# Patient Record
Sex: Male | Born: 1985
Health system: Southern US, Community
[De-identification: ages and names within clinical notes are randomized; demographics above are authoritative.]

## PROBLEM LIST (undated history)

## (undated) DIAGNOSIS — Z1389 Encounter for screening for other disorder: Secondary | ICD-10-CM

## (undated) DIAGNOSIS — T7840XA Allergy, unspecified, initial encounter: Secondary | ICD-10-CM

## (undated) DIAGNOSIS — Z1339 Encounter for screening examination for other mental health and behavioral disorders: Secondary | ICD-10-CM

## (undated) DIAGNOSIS — F319 Bipolar disorder, unspecified: Secondary | ICD-10-CM

## (undated) HISTORY — DX: Encounter for screening for other disorder: Z13.89

## (undated) HISTORY — DX: Bipolar disorder, unspecified: F31.9

## (undated) HISTORY — DX: Allergy, unspecified, initial encounter: T78.40XA

## (undated) HISTORY — DX: Encounter for screening examination for other mental health and behavioral disorders: Z13.39

---
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
## 2014-05-05 ENCOUNTER — Encounter: Payer: Self-pay | Admitting: Family

## 2014-05-05 ENCOUNTER — Ambulatory Visit (INDEPENDENT_AMBULATORY_CARE_PROVIDER_SITE_OTHER): Payer: BC Managed Care – PPO | Admitting: Family

## 2014-05-05 VITALS — BP 152/92 | HR 83 | Temp 98.4°F | Resp 18 | Ht 67.0 in | Wt 195.8 lb

## 2014-05-05 DIAGNOSIS — F902 Attention-deficit hyperactivity disorder, combined type: Secondary | ICD-10-CM | POA: Diagnosis not present

## 2014-05-05 DIAGNOSIS — Z23 Encounter for immunization: Secondary | ICD-10-CM | POA: Diagnosis not present

## 2014-05-05 DIAGNOSIS — F3162 Bipolar disorder, current episode mixed, moderate: Secondary | ICD-10-CM | POA: Diagnosis not present

## 2014-05-05 DIAGNOSIS — F909 Attention-deficit hyperactivity disorder, unspecified type: Secondary | ICD-10-CM | POA: Insufficient documentation

## 2014-05-05 MED ORDER — QUETIAPINE FUMARATE ER 50 MG PO TB24: 50.0000 mg | ORAL_TABLET | Freq: Every day | ORAL | Status: DC

## 2014-05-05 MED ORDER — METHYLPHENIDATE HCL ER (LA) 40 MG PO CP24: 40.0000 mg | ORAL_CAPSULE | ORAL | Status: DC

## 2014-05-05 NOTE — Progress Notes (Signed)
   Subjective:    Patient ID: Jacob [name], male    DOB: [birth year], 28 y.o.   MRN: [id number]  Chief Complaint  Patient presents with  . Establish Care    Bipolar disorder, needs medication    HPI:  Jacob CharityGraham L Minkin is a 28 y.o. male who presents today for to establish care and discuss his ADHD and Bipolar disorder. His wife Marcelino DusterMichelle is present for the visit with his permission.  1) ADHD - Diagnosed as a child. Was previously on Vyvanse, but was changed to Ritalin because of change of insurance and price of medication. Indicates he is able to focus some, but not the best that he could with the Vyvanse. He reports eating well which is confirmed by his wife. He reports sleeping on average 6-7 hours per night but is awakening feeling fatigued like he didn't sleep. He was previously prescribed trazadone and seroquel to help him sleep at night, but was discontinued by previous provider.   2) Bipolar - Diagnosed as a teenager - was retested in 2013 and confirmed. On lithium as a teenager - stopped taking when he moved out and had to start taking it again. He was previously treated in [place], but does not recall the medications he was taking at that time. Attempted to have records transferred before visit, however nothing has been received to this point. Questioning if he needs to go up on medication or change medications. Has never really had depression, but instead anger.  No Known Allergies  Current outpatient prescriptions:cetirizine (ZYRTEC) 10 MG tablet, Take 10 mg by mouth daily., Disp: , Rfl: ;  escitalopram (LEXAPRO) 10 MG tablet, Take 10 mg by mouth daily., Disp: , Rfl: ;  lamoTRIgine (LAMICTAL) 100 MG tablet, Take 100 mg by mouth 2 (two) times daily., Disp: , Rfl: ;  methylphenidate (RITALIN LA) 40 MG 24 hr capsule, Take 1 capsule (40 mg total) by mouth every morning., Disp: 30 capsule, Rfl: 0 QUEtiapine (SEROQUEL XR) 50 MG TB24 24 hr tablet, Take 1 tablet (50 mg total) by mouth at  bedtime., Disp: 30 each, Rfl: 0  Review of Systems    See HPI Objective:    BP 152/92  Pulse 83  Temp(Src) 98.4 F (36.9 C)  Resp 18  Ht 5\' 7"  (1.702 m)  Wt 195 lb 12.8 oz (88.814 kg)  BMI 30.66 kg/m2  SpO2 95% Nursing note and vital signs reviewed.  Physical Exam  Constitutional: He is oriented to person, place, and time. He appears well-developed and well-nourished. No distress.  Cardiovascular: Normal rate, regular rhythm and normal heart sounds.   Pulmonary/Chest: Effort normal and breath sounds normal.  Neurological: He is alert and oriented to person, place, and time.  Skin: Skin is warm and dry.  Psychiatric: He has a normal mood and affect. His behavior is normal. Judgment and thought content normal.       Assessment & Plan:

## 2014-05-05 NOTE — Patient Instructions (Addendum)
Thank you for choosing [facility].  Summary/Instructions:   Please follow up in about 3 weeks  Your prescription has been sent to your pharmacy  Please obtain your old records from [place]

## 2014-05-05 NOTE — Assessment & Plan Note (Signed)
Currently maintained on Lexapro and Lamictal. Appears stable based on patient behavior in office. Wife believes he needs something added or adjusted. Continue current Lexapro and lamictal until records have been received from previous provider. Start Seroquel 50 mg for help with sleep. Follow up in 1 month.

## 2014-05-05 NOTE — Progress Notes (Signed)
Pre visit review using our clinic review tool, if applicable. No additional management support is needed unless otherwise documented below in the visit note. 

## 2014-05-05 NOTE — Assessment & Plan Note (Signed)
Currently maintained on Ritalin with no adverse effects that are apparent. Prefers Vyvanse but cost is a factor. Continue Ritalin and will follow up in 1 month or sooner if needed.

## 2014-05-31 ENCOUNTER — Ambulatory Visit (INDEPENDENT_AMBULATORY_CARE_PROVIDER_SITE_OTHER): Payer: BC Managed Care – PPO | Admitting: Family

## 2014-05-31 ENCOUNTER — Encounter: Payer: Self-pay | Admitting: Family

## 2014-05-31 VITALS — BP 136/78 | HR 87 | Temp 98.3°F | Resp 18 | Ht 67.0 in | Wt 205.0 lb

## 2014-05-31 DIAGNOSIS — F902 Attention-deficit hyperactivity disorder, combined type: Secondary | ICD-10-CM

## 2014-05-31 DIAGNOSIS — F3162 Bipolar disorder, current episode mixed, moderate: Secondary | ICD-10-CM

## 2014-05-31 MED ORDER — QUETIAPINE FUMARATE ER 50 MG PO TB24: 100.0000 mg | ORAL_TABLET | Freq: Every day | ORAL | Status: DC

## 2014-05-31 MED ORDER — METHYLPHENIDATE HCL ER (LA) 40 MG PO CP24: 40.0000 mg | ORAL_CAPSULE | ORAL | Status: DC

## 2014-05-31 NOTE — Patient Instructions (Signed)
Thank you for choosing [facility].  Summary/Instructions:  Your prescription(s) have been submitted to your pharmacy. Please take as directed and contact our office if you believe you are having problem(s) with the medication(s).  Increase your nightly Seroquel to 100 mg nightly. There is plenty of room to work with this.   We will continue to work on obtaining your old records from previous care providers to gather the information that we need.   Please follow up in about 1 month.

## 2014-05-31 NOTE — Assessment & Plan Note (Signed)
Attention appears improved with no adverse effects. Continue current Ritalin LA 40 mg. Follow up in 1 month with Bipolar check.

## 2014-05-31 NOTE — Progress Notes (Signed)
   Subjective:    Patient ID: Jacob [name], male    DOB: [birth year], 28 y.o.   MRN: [id number]  No chief complaint on file.   HPI:  Jacob CharityGraham L Ault is a 28 y.o. male who presents today to follow up on his ADHD and Bipolar disorder. He was previously restarted on Seroquel at the patient's request.   Bipolar/ADHD - Currently maintained on lexapro, lamictal, methylphenidate and quetiapine. Has had some improvements with his sleep, wife describes that he still is restless some nights. He continues to use the CPAP at night to help him sleep. Continues to have daily episodes of becoming distempered, but not violent.   Indicates Some small improvements with the Seroquel. Still sees himself getting ill tempered. Wife describes the frequency has been just about every day. States that his attention has improved. Denies any chest pain or palpitations. Is eating well and getting on average 5-6 hours of sleep most days of the week.   [place] [facility] [place] [facility] [place] [place] [facility].   No Known Allergies   Past Medical History  Diagnosis Date  . Bipolar disorder   . Allergy   . ADHD (attention deficit hyperactivity disorder) evaluation     Review of Systems     Objective:    BP 136/78 mmHg  Pulse 87  Temp(Src) 98.3 F (36.8 C) (Oral)  Resp 18  Ht 5\' 7"  (1.702 m)  Wt 205 lb (92.987 kg)  BMI 32.10 kg/m2  SpO2 95%   Nursing note and vital signs reviewed.  Physical Exam  Constitutional: He is oriented to person, place, and time. He appears well-developed and well-nourished. No distress.  Cardiovascular: Normal rate, regular rhythm, normal heart sounds and intact distal pulses.   Pulmonary/Chest: Effort normal and breath sounds normal.  Neurological: He is alert and oriented to person, place, and time.  Skin: Skin is warm and dry.  Psychiatric: He has a normal mood and affect. His behavior is normal. Judgment and thought content normal.      Assessment & Plan:

## 2014-05-31 NOTE — Assessment & Plan Note (Signed)
Increase Seroquel to 100 mg. Pt and wife are wishing to wait on referral to psychiatry secondary to cost and having records. Continue lamictal and lexapro at current dosages. Follow up in one month.

## 2014-05-31 NOTE — Progress Notes (Signed)
Pre visit review using our clinic review tool, if applicable. No additional management support is needed unless otherwise documented below in the visit note. 

## 2014-06-22 ENCOUNTER — Telehealth: Payer: Self-pay | Admitting: Family

## 2014-06-22 NOTE — Telephone Encounter (Signed)
[facility]. Sent to Dr. Carver Filaalone. 06/22/14/ss.

## 2014-07-07 ENCOUNTER — Encounter: Payer: Self-pay | Admitting: Family

## 2014-07-07 ENCOUNTER — Ambulatory Visit (INDEPENDENT_AMBULATORY_CARE_PROVIDER_SITE_OTHER): Payer: BC Managed Care – PPO | Admitting: Family

## 2014-07-07 ENCOUNTER — Other Ambulatory Visit: Payer: BC Managed Care – PPO

## 2014-07-07 ENCOUNTER — Other Ambulatory Visit (INDEPENDENT_AMBULATORY_CARE_PROVIDER_SITE_OTHER): Payer: BC Managed Care – PPO

## 2014-07-07 VITALS — BP 122/72 | HR 111 | Temp 98.3°F | Resp 18 | Ht 67.0 in | Wt 214.4 lb

## 2014-07-07 DIAGNOSIS — F3162 Bipolar disorder, current episode mixed, moderate: Secondary | ICD-10-CM

## 2014-07-07 DIAGNOSIS — F902 Attention-deficit hyperactivity disorder, combined type: Secondary | ICD-10-CM

## 2014-07-07 DIAGNOSIS — M25511 Pain in right shoulder: Secondary | ICD-10-CM | POA: Insufficient documentation

## 2014-07-07 LAB — BASIC METABOLIC PANEL
BUN: 17 mg/dL (ref 6–23)
CO2: 27 meq/L (ref 19–32)
Calcium: 9.4 mg/dL (ref 8.4–10.5)
Chloride: 106 mEq/L (ref 96–112)
Creatinine, Ser: 1 mg/dL (ref 0.4–1.5)
GFR: 90.74 mL/min (ref >60.00)
GLUCOSE: 92 mg/dL (ref 70–99)
Potassium: 4.2 mEq/L (ref 3.5–5.1)
SODIUM: 137 meq/L (ref 135–145)

## 2014-07-07 LAB — CBC
HEMATOCRIT: 45.2 % (ref 39.0–52.0)
Hemoglobin: 15.3 g/dL (ref 13.0–17.0)
MCHC: 33.8 g/dL (ref 30.0–36.0)
MCV: 92.6 fl (ref 78.0–100.0)
Platelets: 260 10*3/uL (ref 150.0–400.0)
RBC: 4.89 Mil/uL (ref 4.22–5.81)
RDW: 12.9 % (ref 11.5–15.5)
WBC: 7.9 10*3/uL (ref 4.0–10.5)

## 2014-07-07 LAB — TSH: TSH: 1.16 u[IU]/mL (ref 0.35–4.50)

## 2014-07-07 MED ORDER — IBUPROFEN 800 MG PO TABS: 800.0000 mg | ORAL_TABLET | Freq: Three times a day (TID) | ORAL | Status: DC | PRN

## 2014-07-07 MED ORDER — LAMOTRIGINE 100 MG PO TABS: 100.0000 mg | ORAL_TABLET | Freq: Two times a day (BID) | ORAL | Status: DC

## 2014-07-07 MED ORDER — QUETIAPINE FUMARATE ER 50 MG PO TB24: ORAL_TABLET | ORAL | Status: DC

## 2014-07-07 MED ORDER — QUETIAPINE FUMARATE ER 300 MG PO TB24: 300.0000 mg | ORAL_TABLET | Freq: Every day | ORAL | Status: DC

## 2014-07-07 MED ORDER — METHYLPHENIDATE HCL ER (LA) 40 MG PO CP24: 40.0000 mg | ORAL_CAPSULE | ORAL | Status: DC

## 2014-07-07 NOTE — Patient Instructions (Addendum)
Thank you for choosing [facility].  Summary/Instructions:  Your prescription(s) have been submitted to your pharmacy. Please take as directed and contact our office if you believe you are having problem(s) with the medication(s).  If your symptoms worsen or fail to improve, please contact our office for further instruction, or in case of emergency go directly to the emergency room at the closest medical facility.      

## 2014-07-07 NOTE — Assessment & Plan Note (Signed)
Continues to experience mood swings. Discontinue Lexapro. Increase Seroquel. Continue Lamictal at 100 mg. Patient advised of side effects and risks and benefits of medication changes. Patient given instructions on proper increase of medication and decrease in discontinuation of other medication. Follow-up in one month. Obtain basic metabolic panel, CBC, and TSH.

## 2014-07-07 NOTE — Assessment & Plan Note (Signed)
Currently stable at this time. Continue current regimen of Ritalin long-acting 40 mg daily.

## 2014-07-07 NOTE — Progress Notes (Signed)
   Subjective:    Patient ID: Jacob [name], male    DOB: 1985-08-26, 28 y.o.   MRN: [id number]  Chief Complaint  Patient presents with  . Follow-up    discuss paperwork    HPI:  Jacob CharityGraham L Monterrosa is a 28 y.o. male who presents today for follow up. His wife is present with his permission.  1) Bipolar - Previously increased his Seroquel XR to 100 mg daily. Has noticed some positive changes in his mood, however indicates it is still not where it was previously. His wife indicates that his mood swings continue to be an issue. She also notes the patient has had increased hunger and has gained approximately 20 pounds in the last several months. Patient denies any adverse effects of medications at this time. Reviewed previous medical records with patient to assist in determining appropriate treatment that he was on previously.   2) Shoulder pain - Notes he has had a click in his right shoulder with a pain that is described as between sharp and dull. Worsened by raising his arm over his head. This has been going on for about a month. He does a lot of overhead and repetitive motions at work. Denies any trauma or treatments that have improved his pain.  No Known Allergies  Current Outpatient Prescriptions on File Prior to Visit  Medication Sig Dispense Refill  . cetirizine (ZYRTEC) 10 MG tablet Take 10 mg by mouth daily.     No current facility-administered medications on file prior to visit.   Past Medical History  Diagnosis Date  . Bipolar disorder   . Allergy   . ADHD (attention deficit hyperactivity disorder) evaluation      Review of Systems  Cardiovascular: Negative for chest pain and palpitations.  Musculoskeletal: Positive for arthralgias (Right shoulder).  Neurological: Negative for tremors and headaches.  Psychiatric/Behavioral: Negative for decreased concentration.       Positive for mood swings      Objective:    BP 122/72 mmHg  Pulse 111  Temp(Src) 98.3 F (36.8 C)  (Oral)  Resp 18  Ht 5\' 7"  (1.702 m)  Wt 214 lb 6.4 oz (97.251 kg)  BMI 33.57 kg/m2  SpO2 98% Nursing note and vital signs reviewed.  Physical Exam  Constitutional: He is oriented to person, place, and time. He appears well-developed and well-nourished. No distress.  Cardiovascular: Normal rate, regular rhythm, normal heart sounds and intact distal pulses.   Pulmonary/Chest: Effort normal and breath sounds normal.  Musculoskeletal:  No obvious deformity, discoloration, or edema of right shoulder noted. Point tenderness elicited along subacromial space and area supraspinatus tendon. Patient exhibits slightly decreased range of motion greater than 90 abduction and flexion. Positive Neer's impingement. Negative apprehension, empty can, and drop arm.  Neurological: He is alert and oriented to person, place, and time.  Skin: Skin is warm and dry.  Psychiatric: He has a normal mood and affect. His behavior is normal. Judgment and thought content normal.       Assessment & Plan:

## 2014-07-07 NOTE — Progress Notes (Signed)
Pre visit review using our clinic review tool, if applicable. No additional management support is needed unless otherwise documented below in the visit note. 

## 2014-07-07 NOTE — Assessment & Plan Note (Signed)
Right shoulder pain consistent with potential impingement. Recommend conservative treatment at this time. Start ibuprofen 800 mg. If no improvement noted in the next 10 days, will refer to sports medicine for further evaluation. Continue to ice as needed.

## 2014-07-12 ENCOUNTER — Ambulatory Visit: Payer: BC Managed Care – PPO | Admitting: Family

## 2014-07-15 ENCOUNTER — Encounter: Payer: Self-pay | Admitting: Family

## 2014-07-20 NOTE — Telephone Encounter (Signed)
Please see email, pt called in today about this

## 2014-07-28 ENCOUNTER — Ambulatory Visit (INDEPENDENT_AMBULATORY_CARE_PROVIDER_SITE_OTHER): Payer: BLUE CROSS/BLUE SHIELD | Admitting: Family

## 2014-07-28 ENCOUNTER — Encounter: Payer: Self-pay | Admitting: Family

## 2014-07-28 VITALS — BP 142/90 | HR 78 | Temp 98.3°F | Resp 18 | Wt 222.2 lb

## 2014-07-28 DIAGNOSIS — F3162 Bipolar disorder, current episode mixed, moderate: Secondary | ICD-10-CM

## 2014-07-28 MED ORDER — QUETIAPINE FUMARATE 300 MG PO TABS: 300.0000 mg | ORAL_TABLET | Freq: Every day | ORAL | Status: DC

## 2014-07-28 MED ORDER — QUETIAPINE FUMARATE 100 MG PO TABS: ORAL_TABLET | ORAL | Status: DC

## 2014-07-28 NOTE — Patient Instructions (Addendum)
Thank you for choosing [facility].  Summary/Instructions:  Your prescription(s) have been submitted to your pharmacy or been printed and provided for you. Please take as directed and contact our office if you believe you are having problem(s) with the medication(s) or have any questions.  If your symptoms worsen or fail to improve, please contact our office for further instruction, or in case of emergency go directly to the emergency room at the closest medical facility.   Restart seroquel:  50 mg the first day (0.5 tablets) 100 mg the second day (1 tablet) 200 mg the third day (2 tablets) 300 mg from then on

## 2014-07-28 NOTE — Assessment & Plan Note (Signed)
Patient's symptoms worsen when off Seroquel. Does not appear to have any adverse effects from the Lexapro discontinuation. Restart Seroquel with goal of 300 mg daily. If continued to have problems with insurance filling medication, patient requested to bring a list of medications approved by insurance from his pharmacy. Patient advised to seek emergency care if symptoms of increased thoughts of suicide develop starting Seroquel. Follow up in approximately one month.

## 2014-07-28 NOTE — Progress Notes (Signed)
Pre visit review using our clinic review tool, if applicable. No additional management support is needed unless otherwise documented below in the visit note. 

## 2014-07-28 NOTE — Progress Notes (Signed)
   Subjective:    Patient ID: Jacob [name], male    DOB: [birth year], 29 y.o.   MRN: [id number]  No chief complaint on file.   HPI:  Jacob CharityGraham L Knight is a 29 y.o. male who presents today to follow up on his bipolar.   Was recently increased in his Seroquel and discontinued his Lexapro. Serequeol was not approved by his insurance so he has not taken any seroquel since previous office visit.  He remains on lamictal. Indicates that he has had continued agitation. His wife indicates this has been exponentially increased since being off his seroquel. Reports his attention has been appropriate and continues to have a good appetite. Wife denies any full manic or depressive episodes.   No Known Allergies  Current Outpatient Prescriptions on File Prior to Visit  Medication Sig Dispense Refill  . cetirizine (ZYRTEC) 10 MG tablet Take 10 mg by mouth daily.    Marland Kitchen. ibuprofen (ADVIL,MOTRIN) 800 MG tablet Take 1 tablet (800 mg total) by mouth every 8 (eight) hours as needed. 30 tablet 0  . lamoTRIgine (LAMICTAL) 100 MG tablet Take 1 tablet (100 mg total) by mouth 2 (two) times daily. 60 tablet 1  . methylphenidate (RITALIN LA) 40 MG 24 hr capsule Take 1 capsule (40 mg total) by mouth every morning. 30 capsule 0   No current facility-administered medications on file prior to visit.    Review of Systems  Respiratory: Negative for chest tightness and shortness of breath.   Cardiovascular: Negative for chest pain, palpitations and leg swelling.  Psychiatric/Behavioral: Positive for sleep disturbance and agitation. Negative for hallucinations and decreased concentration.      Objective:  BP 142/90 mmHg  Pulse 78  Temp(Src) 98.3 F (36.8 C) (Oral)  Resp 18  Wt 222 lb 3.2 oz (100.789 kg)  SpO2 98%  Nursing note and vital signs reviewed.  Physical Exam  Constitutional: He is oriented to person, place, and time. He appears well-developed and well-nourished. No distress.  Cardiovascular: Normal rate,  regular rhythm, normal heart sounds and intact distal pulses.   Pulmonary/Chest: Effort normal and breath sounds normal.  Neurological: He is alert and oriented to person, place, and time.  Skin: Skin is warm and dry.  Psychiatric: He has a normal mood and affect. His behavior is normal. Judgment and thought content normal.       Assessment & Plan:

## 2014-08-08 ENCOUNTER — Ambulatory Visit: Payer: BC Managed Care – PPO | Admitting: Family

## 2014-08-29 ENCOUNTER — Ambulatory Visit (INDEPENDENT_AMBULATORY_CARE_PROVIDER_SITE_OTHER): Payer: BLUE CROSS/BLUE SHIELD | Admitting: Family

## 2014-08-29 ENCOUNTER — Encounter: Payer: Self-pay | Admitting: Family

## 2014-08-29 VITALS — BP 138/90 | HR 72 | Temp 98.5°F | Resp 18 | Wt 223.8 lb

## 2014-08-29 DIAGNOSIS — F3162 Bipolar disorder, current episode mixed, moderate: Secondary | ICD-10-CM

## 2014-08-29 DIAGNOSIS — F902 Attention-deficit hyperactivity disorder, combined type: Secondary | ICD-10-CM

## 2014-08-29 MED ORDER — ARIPIPRAZOLE 10 MG PO TABS: 10.0000 mg | ORAL_TABLET | Freq: Every day | ORAL | Status: DC

## 2014-08-29 MED ORDER — METHYLPHENIDATE HCL ER (LA) 40 MG PO CP24: 40.0000 mg | ORAL_CAPSULE | ORAL | Status: DC

## 2014-08-29 MED ORDER — QUETIAPINE FUMARATE 300 MG PO TABS: 300.0000 mg | ORAL_TABLET | Freq: Every day | ORAL | Status: DC

## 2014-08-29 NOTE — Patient Instructions (Signed)
Thank you for choosing [facility].  Summary/Instructions:  Your prescription(s) have been submitted to your pharmacy or been printed and provided for you. Please take as directed and contact our office if you believe you are having problem(s) with the medication(s) or have any questions.  If your symptoms worsen or fail to improve, please contact our office for further instruction, or in case of emergency go directly to the emergency room at the closest medical facility.    Aripiprazole tablets What is this medicine? ARIPIPRAZOLE (ay ri PIP ray zole) is an atypical antipsychotic. It is used to treat schizophrenia and bipolar disorder, also known as manic-depression. It is also used to treat Tourette's disorder and some symptoms of autism. This medicine may also be used in combination with antidepressants to treat major depressive disorder. This medicine may be used for other purposes; ask your health care provider or pharmacist if you have questions. COMMON BRAND NAME(S): Abilify What should I tell my health care provider before I take this medicine? They need to know if you have any of these conditions: -dehydration -dementia -diabetes -heart disease -history of stroke -low blood counts, like low white cell, platelet, or red cell counts -Parkinson's disease -seizures -suicidal thoughts, plans, or attempt; a previous suicide attempt by you or a family member -an unusual or allergic reaction to aripiprazole, other medicines, foods, dyes, or preservatives -pregnant or trying to get pregnant -breast-feeding How should I use this medicine? Take this medicine by mouth with a glass of water. Follow the directions on the prescription label. You can take this medicine with or without food. Take your doses at regular intervals. Do not take your medicine more often than directed. Do not stop taking except on the advice of your doctor or health care professional. A special MedGuide will be  given to you by the pharmacist with each prescription and refill. Be sure to read this information carefully each time. Talk to your pediatrician regarding the use of this medicine in children. While this drug may be prescribed for children as young as 6 years of age for selected conditions, precautions do apply. Overdosage: If you think you have taken too much of this medicine contact a poison control center or emergency room at once. NOTE: This medicine is only for you. Do not share this medicine with others. What if I miss a dose? If you miss a dose, take it as soon as you can. If it is almost time for your next dose, take only that dose. Do not take double or extra doses. What may interact with this medicine? Do not take this medicine with any of the following medications: -certain medicines for fungal infections like fluconazole, itraconazole, ketoconazole, posaconazole, voriconazole -cisapride -dofetilide -dronedarone -pimozide -thioridazine -ziprasidone This medicine may also interact with the following medications: -carbamazepine -certain medicines for blood pressure -erythromycin -fluoxetine -grapefruit juice -other medicines that prolong the QT interval (cause an abnormal heart rhythm) -paroxetine -quinidine -valproic acid This list may not describe all possible interactions. Give your health care provider a list of all the medicines, herbs, non-prescription drugs, or dietary supplements you use. Also tell them if you smoke, drink alcohol, or use illegal drugs. Some items may interact with your medicine. What should I watch for while using this medicine? Visit your doctor or health care professional for regular checks on your progress. It may be several weeks before you see the full effects of this medicine. Do not suddenly stop taking this medicine. You may need to   gradually reduce the dose. Patients and their families should watch out for worsening depression or thoughts of  suicide. Also watch out for sudden changes in feelings such as feeling anxious, agitated, panicky, irritable, hostile, aggressive, impulsive, severely restless, overly excited and hyperactive, or not being able to sleep. If this happens, especially at the beginning of antidepressant treatment or after a change in dose, call your health care professional. You may get dizzy or drowsy. Do not drive, use machinery, or do anything that needs mental alertness until you know how this medicine affects you. Do not stand or sit up quickly, especially if you are an older patient. This reduces the risk of dizzy or fainting spells. Alcohol can increase dizziness and drowsiness. Avoid alcoholic drinks. This medicine can reduce the response of your body to heat or cold. Dress warm in cold weather and stay hydrated in hot weather. If possible, avoid extreme temperatures like saunas, hot tubs, very hot or cold showers, or activities that can cause dehydration such as vigorous exercise. This medicine may cause dry eyes and blurred vision. If you wear contact lenses you may feel some discomfort. Lubricating drops may help. See your eye doctor if the problem does not go away or is severe. If you notice an increased hunger or thirst, different from your normal hunger or thirst, or if you find that you have to urinate more frequently, you should contact your health care provider as soon as possible. You may need to have your blood sugar monitored. This medicine may cause changes in your blood sugar levels. You should monitor you blood sugar frequently if you have diabetes. What side effects may I notice from receiving this medicine? Side effects that you should report to your doctor or health care professional as soon as possible: -allergic reactions like skin rash, itching or hives, swelling of the face, lips, or tongue -breathing problems -confusion -feeling faint or lightheaded, falls -fever or chills, sore  throat -increased hunger or thirst -increased urination -joint pain -muscles pain, spasms -problems with balance, talking, walking -restlessness or need to keep moving -seizures -suicidal thoughts or other mood changes -trouble swallowing -uncontrollable head, mouth, neck, arm, or leg movements -unusually weak or tired Side effects that usually do not require medical attention (report to your doctor or health care professional if they continue or are bothersome): -blurred vision -constipation -headache -nausea, vomiting -trouble sleeping -weight gain This list may not describe all possible side effects. Call your doctor for medical advice about side effects. You may report side effects to FDA at 1-800-FDA-1088. Where should I keep my medicine? Keep out of the reach of children. Store at room temperature between 15 and 30 degrees C (59 and 86 degrees F). Throw away any unused medicine after the expiration date. NOTE: This sheet is a summary. It may not cover all possible information. If you have questions about this medicine, talk to your doctor, pharmacist, or health care provider.  2015, Elsevier/Gold Standard. (2013-07-21 12:40:07)  

## 2014-08-29 NOTE — Progress Notes (Signed)
Pre visit review using our clinic review tool, if applicable. No additional management support is needed unless otherwise documented below in the visit note. 

## 2014-08-29 NOTE — Assessment & Plan Note (Signed)
Stable with current regimen. Continue current dosage of Ritalin LA.

## 2014-08-29 NOTE — Assessment & Plan Note (Signed)
Mood remains labile with current regimen. Continue current dosage of Lamictal and Seroquel. Start low dose Abilify. Discussed referral to psychiatry to assist with management of medications, however patient requests to wait until insurance this process in the fall. Discussed risks and benefits of starting additional medications and side effects. Patient wishes to continue. Follow-up in one month.

## 2014-08-29 NOTE — Progress Notes (Signed)
   Subjective:    Patient ID: Jacob [name], male    DOB: 09-Oct-1985, 29 y.o.   MRN: [id number]   Chief Complaint  Patient presents with  . Follow-up    got an approval letter for seroquel xr     HPI:  Jacob [name] is a 29 y.o. male who presents today for follow up. Wife is present for today's visit.   1) Bipolar - Recently was restarted on Seroquel with titration up to 300 mg daily. Currently maintained on Lamictal and Seroquel. Indicates that he continues to have some "acting out" such as cussing and screaming. Denies any manic/depressive episodes.  States that when he comes home he is ready for bed at 8 pm.    2) ADHD - Stable with current regimen and treated with Ritalin 40 mg.    No Known Allergies  Current Outpatient Prescriptions on File Prior to Visit  Medication Sig Dispense Refill  . cetirizine (ZYRTEC) 10 MG tablet Take 10 mg by mouth daily.    Marland Kitchen. ibuprofen (ADVIL,MOTRIN) 800 MG tablet Take 1 tablet (800 mg total) by mouth every 8 (eight) hours as needed. 30 tablet 0  . lamoTRIgine (LAMICTAL) 100 MG tablet Take 1 tablet (100 mg total) by mouth 2 (two) times daily. 60 tablet 1   No current facility-administered medications on file prior to visit.    Review of Systems  Respiratory: Negative for chest tightness and shortness of breath.   Cardiovascular: Negative for chest pain, palpitations and leg swelling.  Psychiatric/Behavioral: Positive for behavioral problems. Negative for hallucinations, decreased concentration and agitation. The patient is not hyperactive.       Objective:    BP 138/90 mmHg  Pulse 72  Temp(Src) 98.5 F (36.9 C) (Oral)  Resp 18  Wt 223 lb 12.8 oz (101.515 kg)  SpO2 96% Nursing note and vital signs reviewed.  Physical Exam  Constitutional: He is oriented to person, place, and time. He appears well-developed and well-nourished. No distress.  Cardiovascular: Normal rate, regular rhythm, normal heart sounds and intact distal pulses.     Pulmonary/Chest: Effort normal and breath sounds normal.  Neurological: He is alert and oriented to person, place, and time.  Skin: Skin is warm and dry.  Psychiatric: He has a normal mood and affect. His behavior is normal. Judgment and thought content normal. He is not agitated, not aggressive, not hyperactive, not actively hallucinating and not combative.       Assessment & Plan:

## 2014-09-26 ENCOUNTER — Encounter: Payer: Self-pay | Admitting: Family

## 2014-09-26 ENCOUNTER — Ambulatory Visit: Payer: BLUE CROSS/BLUE SHIELD | Admitting: Family

## 2014-09-26 ENCOUNTER — Other Ambulatory Visit: Payer: Self-pay | Admitting: Family

## 2014-09-26 ENCOUNTER — Telehealth: Payer: Self-pay

## 2014-09-26 DIAGNOSIS — F3162 Bipolar disorder, current episode mixed, moderate: Secondary | ICD-10-CM

## 2014-09-26 MED ORDER — METHYLPHENIDATE HCL ER (LA) 40 MG PO CP24: 40.0000 mg | ORAL_CAPSULE | ORAL | Status: DC

## 2014-09-26 MED ORDER — ARIPIPRAZOLE 20 MG PO TABS: 20.0000 mg | ORAL_TABLET | Freq: Every day | ORAL | Status: DC

## 2014-09-26 NOTE — Telephone Encounter (Signed)
Pt aware of ritalin being ready for pick up

## 2014-10-10 ENCOUNTER — Other Ambulatory Visit: Payer: Self-pay

## 2014-10-10 DIAGNOSIS — F3162 Bipolar disorder, current episode mixed, moderate: Secondary | ICD-10-CM

## 2014-10-10 MED ORDER — LAMOTRIGINE 100 MG PO TABS: 100.0000 mg | ORAL_TABLET | Freq: Two times a day (BID) | ORAL | Status: DC

## 2014-10-26 ENCOUNTER — Encounter: Payer: Self-pay | Admitting: Family

## 2014-10-26 ENCOUNTER — Other Ambulatory Visit (INDEPENDENT_AMBULATORY_CARE_PROVIDER_SITE_OTHER): Payer: BLUE CROSS/BLUE SHIELD

## 2014-10-26 ENCOUNTER — Ambulatory Visit (INDEPENDENT_AMBULATORY_CARE_PROVIDER_SITE_OTHER): Payer: BLUE CROSS/BLUE SHIELD | Admitting: Family

## 2014-10-26 VITALS — BP 150/88 | HR 90 | Temp 98.3°F | Resp 18 | Ht 67.0 in | Wt 227.0 lb

## 2014-10-26 DIAGNOSIS — F3162 Bipolar disorder, current episode mixed, moderate: Secondary | ICD-10-CM

## 2014-10-26 DIAGNOSIS — Z0000 Encounter for general adult medical examination without abnormal findings: Secondary | ICD-10-CM | POA: Insufficient documentation

## 2014-10-26 DIAGNOSIS — F902 Attention-deficit hyperactivity disorder, combined type: Secondary | ICD-10-CM

## 2014-10-26 LAB — COMPREHENSIVE METABOLIC PANEL
ALT: 25 U/L (ref 0–53)
AST: 20 U/L (ref 0–37)
Albumin: 4.5 g/dL (ref 3.5–5.2)
Alkaline Phosphatase: 39 U/L (ref 39–117)
BUN: 13 mg/dL (ref 6–23)
CALCIUM: 9.8 mg/dL (ref 8.4–10.5)
CHLORIDE: 105 meq/L (ref 96–112)
CO2: 29 meq/L (ref 19–32)
Creatinine, Ser: 1.16 mg/dL (ref 0.40–1.50)
GFR: 79.82 mL/min (ref >60.00)
GLUCOSE: 104 mg/dL — AB (ref 70–99)
POTASSIUM: 3.9 meq/L (ref 3.5–5.1)
SODIUM: 139 meq/L (ref 135–145)
TOTAL PROTEIN: 6.7 g/dL (ref 6.0–8.3)
Total Bilirubin: 0.2 mg/dL (ref 0.2–1.2)

## 2014-10-26 MED ORDER — METHYLPHENIDATE HCL ER (LA) 40 MG PO CP24: 40.0000 mg | ORAL_CAPSULE | ORAL | Status: DC

## 2014-10-26 MED ORDER — METHYLPHENIDATE HCL ER (LA) 10 MG PO CP24: 10.0000 mg | ORAL_CAPSULE | Freq: Every day | ORAL | Status: DC

## 2014-10-26 MED ORDER — ARIPIPRAZOLE 30 MG PO TABS: 30.0000 mg | ORAL_TABLET | Freq: Every day | ORAL | Status: DC

## 2014-10-26 MED ORDER — QUETIAPINE FUMARATE 300 MG PO TABS: 300.0000 mg | ORAL_TABLET | Freq: Every day | ORAL | Status: DC

## 2014-10-26 NOTE — Assessment & Plan Note (Addendum)
Bipolar is stable with current regimen and patient reports decreased aggression at home. Denies adverse effects of medications Increase obtain complete metabolic panel check kidney function and electrolytes. Abilify to 30 mg. Continue current dosages of Lamictal and Seroquel. Follow-up in one month or sooner if needed.

## 2014-10-26 NOTE — Assessment & Plan Note (Signed)
Attention is slightly decreased since previous visit. Increase Ritalin to 50 mg daily. Controlled substance contract signed and urine drug screen completed.

## 2014-10-26 NOTE — Progress Notes (Signed)
Pre visit review using our clinic review tool, if applicable. No additional management support is needed unless otherwise documented below in the visit note. 

## 2014-10-26 NOTE — Patient Instructions (Addendum)
Thank you for choosing [facility].  Summary/Instructions:  Your prescription(s) have been submitted to your pharmacy or been printed and provided for you. Please take as directed and contact our office if you believe you are having problem(s) with the medication(s) or have any questions.  Please stop by the lab on the basement level of the building for your blood work. Your results will be released to MyChart (or called to you) after review, usually within 72 hours after test completion. If any changes need to be made, you will be notified at that same time.   

## 2014-10-26 NOTE — Progress Notes (Signed)
   Subjective:    Patient ID: Jacob CharityGraham L Lizaola, male    DOB: 1986-03-23, 29 y.o.   MRN: [id number]  Chief Complaint  Patient presents with  . Follow-up    Needs medication, also wants a check up    HPI:  Jacob [name] is a 29 y.o. male with a history of Bipolar and ADHD who presents today to follow up on these conditions.  1) Bipolar - Previously increased his Abilify at last visit. Indicates that his mood has become more stabilized with the Abilify, Seroquel, and Lamictal. States that he feels much improved, however feels that there is a small room for improvement and is wondering if increasing the medication may help.   2) ADHD - Indicates that his attention is good, however notes that sometimes he has increased symptoms attention deficit and is wondering about a tolerance to the medication.  No Known Allergies   Current Outpatient Prescriptions on File Prior to Visit  Medication Sig Dispense Refill  . ARIPiprazole (ABILIFY) 20 MG tablet Take 1 tablet (20 mg total) by mouth daily. 30 tablet 1  . cetirizine (ZYRTEC) 10 MG tablet Take 10 mg by mouth daily.    Marland Kitchen. ibuprofen (ADVIL,MOTRIN) 800 MG tablet Take 1 tablet (800 mg total) by mouth every 8 (eight) hours as needed. 30 tablet 0  . lamoTRIgine (LAMICTAL) 100 MG tablet Take 1 tablet (100 mg total) by mouth 2 (two) times daily. 60 tablet 1  . methylphenidate (RITALIN LA) 40 MG 24 hr capsule Take 1 capsule (40 mg total) by mouth every morning. 30 capsule 0  . QUEtiapine (SEROQUEL) 300 MG tablet Take 1 tablet (300 mg total) by mouth at bedtime. 30 tablet 1   No current facility-administered medications on file prior to visit.    Review of Systems  Constitutional: Negative for activity change, appetite change, fatigue and unexpected weight change.  Psychiatric/Behavioral: Positive for decreased concentration. Negative for behavioral problems, sleep disturbance and agitation. The patient is not hyperactive.       Objective:    BP  150/88 mmHg  Pulse 90  Temp(Src) 98.3 F (36.8 C) (Oral)  Resp 18  Ht 5\' 7"  (1.702 m)  Wt 227 lb (102.967 kg)  BMI 35.55 kg/m2  SpO2 96% Nursing note and vital signs reviewed.  Physical Exam  Constitutional: He is oriented to person, place, and time. He appears well-developed and well-nourished. No distress.  Cardiovascular: Normal rate, regular rhythm, normal heart sounds and intact distal pulses.   Pulmonary/Chest: Effort normal and breath sounds normal.  Neurological: He is alert and oriented to person, place, and time.  Skin: Skin is warm and dry.  Psychiatric: He has a normal mood and affect. His behavior is normal. Judgment and thought content normal.       Assessment & Plan:

## 2014-11-01 ENCOUNTER — Encounter: Payer: Self-pay | Admitting: Family

## 2014-11-01 DIAGNOSIS — F902 Attention-deficit hyperactivity disorder, combined type: Secondary | ICD-10-CM

## 2014-11-01 MED ORDER — METHYLPHENIDATE HCL ER (LA) 60 MG PO CP24: 60.0000 mg | ORAL_CAPSULE | Freq: Every day | ORAL | Status: DC

## 2014-11-01 NOTE — Telephone Encounter (Signed)
I have printed the new prescription and is available for pick up.

## 2014-11-14 ENCOUNTER — Encounter: Payer: Self-pay | Admitting: Family

## 2014-11-14 ENCOUNTER — Ambulatory Visit (INDEPENDENT_AMBULATORY_CARE_PROVIDER_SITE_OTHER): Payer: BLUE CROSS/BLUE SHIELD | Admitting: Family

## 2014-11-14 VITALS — BP 120/72 | HR 102 | Temp 98.0°F | Resp 18 | Ht 67.0 in | Wt 232.0 lb

## 2014-11-14 DIAGNOSIS — F3162 Bipolar disorder, current episode mixed, moderate: Secondary | ICD-10-CM | POA: Diagnosis not present

## 2014-11-14 DIAGNOSIS — F902 Attention-deficit hyperactivity disorder, combined type: Secondary | ICD-10-CM

## 2014-11-14 MED ORDER — ARIPIPRAZOLE 30 MG PO TABS: 30.0000 mg | ORAL_TABLET | Freq: Every day | ORAL | Status: DC

## 2014-11-14 MED ORDER — LAMOTRIGINE 100 MG PO TABS: 100.0000 mg | ORAL_TABLET | Freq: Two times a day (BID) | ORAL | Status: DC

## 2014-11-14 MED ORDER — METHYLPHENIDATE HCL ER (LA) 40 MG PO CP24: 40.0000 mg | ORAL_CAPSULE | ORAL | Status: DC

## 2014-11-14 MED ORDER — METHYLPHENIDATE HCL ER (LA) 20 MG PO CP24: 20.0000 mg | ORAL_CAPSULE | ORAL | Status: DC

## 2014-11-14 MED ORDER — QUETIAPINE FUMARATE 300 MG PO TABS
300.0000 mg | ORAL_TABLET | Freq: Every day | ORAL | Status: DC
Start: 2014-11-14 — End: 2015-01-27

## 2014-11-14 NOTE — Assessment & Plan Note (Signed)
Bipolar is stable with current regimen of Seroquel, Abilify, and Lamictal. Taking medications as prescribed and denies adverse side effects. Continue current dosages of Seroquel, Abilify, and Lamictal. Follow-up in 3 months.

## 2014-11-14 NOTE — Patient Instructions (Signed)
Thank you for choosing [facility].  Summary/Instructions:  Your prescription(s) have been submitted to your pharmacy or been printed and provided for you. Please take as directed and contact our office if you believe you are having problem(s) with the medication(s) or have any questions.  If your symptoms worsen or fail to improve, please contact our office for further instruction, or in case of emergency go directly to the emergency room at the closest medical facility.     

## 2014-11-14 NOTE — Progress Notes (Signed)
   Subjective:    Patient ID: Jacob [name], male    DOB: 1985/11/24, 29 y.o.   MRN: [id number]  Chief Complaint  Patient presents with  . Follow-up    was never able to get his 60 mg ritalin filled    HPI:  Jacob CharityGraham L Wik is a 29 y.o. male who with a PMH of Bipolar and ADHD presents today for a follow up office visit.  1) ADHD - Previously wanting to increase dose of Ritalin LA to 50 mg, notes there was associated expense with the 10 mg tablets costing $100 secondary to no generic. Attempted to increase dose to 60 mg and was backordered by pharmacy. They do note that have 20 mg and 40 mg tablets.   2) Bipolar - Indicates that his mood continues to be stable with the current treatment of lamictal, seroquel, and abilify. He is taking the medications as prescribed and denies adverse side effects.   No Known Allergies   Current Outpatient Prescriptions on File Prior to Visit  Medication Sig Dispense Refill  . ARIPiprazole (ABILIFY) 30 MG tablet Take 1 tablet (30 mg total) by mouth daily. 30 tablet 0  . cetirizine (ZYRTEC) 10 MG tablet Take 10 mg by mouth daily.    Marland Kitchen. ibuprofen (ADVIL,MOTRIN) 800 MG tablet Take 1 tablet (800 mg total) by mouth every 8 (eight) hours as needed. 30 tablet 0  . lamoTRIgine (LAMICTAL) 100 MG tablet Take 1 tablet (100 mg total) by mouth 2 (two) times daily. 60 tablet 1  . Methylphenidate HCl ER, LA, (RITALIN LA) 60 MG CP24 Take 60 mg by mouth daily. 30 capsule 0  . QUEtiapine (SEROQUEL) 300 MG tablet Take 1 tablet (300 mg total) by mouth at bedtime. 30 tablet 1   No current facility-administered medications on file prior to visit.    Review of Systems  Respiratory: Negative for chest tightness and shortness of breath.   Cardiovascular: Negative for chest pain, palpitations and leg swelling.  Neurological: Negative for headaches.  Psychiatric/Behavioral: Negative for suicidal ideas, behavioral problems, sleep disturbance, dysphoric mood, decreased  concentration and agitation.      Objective:    BP 120/72 mmHg  Pulse 102  Temp(Src) 98 F (36.7 C) (Oral)  Resp 18  Ht 5\' 7"  (1.702 m)  Wt 232 lb (105.235 kg)  BMI 36.33 kg/m2  SpO2 97% Nursing note and vital signs reviewed.  Physical Exam  Constitutional: He is oriented to person, place, and time. He appears well-developed and well-nourished. No distress.  Cardiovascular: Normal rate, regular rhythm, normal heart sounds and intact distal pulses.   Pulmonary/Chest: Effort normal and breath sounds normal.  Neurological: He is alert and oriented to person, place, and time.  Skin: Skin is warm and dry.  Psychiatric: He has a normal mood and affect. His behavior is normal. Judgment and thought content normal.       Assessment & Plan:

## 2014-11-14 NOTE — Progress Notes (Signed)
Pre visit review using our clinic review tool, if applicable. No additional management support is needed unless otherwise documented below in the visit note. 

## 2014-11-14 NOTE — Assessment & Plan Note (Signed)
Previously increased dose of Ritalin to 50 mg daily, however there were issues with medication availability. Dose was increased to 60 mg, however there were still issues with medication availability. Attention remains adequately controlled with small room for improvement with current dosage. Start Ritalin 40 mg once daily and Ritalin 20 mg once daily to total 60 mg daily. Follow-up in one month.

## 2015-01-17 ENCOUNTER — Ambulatory Visit: Payer: BLUE CROSS/BLUE SHIELD | Admitting: Family

## 2015-01-25 ENCOUNTER — Ambulatory Visit: Payer: BLUE CROSS/BLUE SHIELD | Admitting: Family

## 2015-01-27 ENCOUNTER — Encounter: Payer: Self-pay | Admitting: Family

## 2015-01-27 ENCOUNTER — Ambulatory Visit (INDEPENDENT_AMBULATORY_CARE_PROVIDER_SITE_OTHER): Payer: BLUE CROSS/BLUE SHIELD | Admitting: Family

## 2015-01-27 VITALS — BP 120/78 | HR 81 | Temp 98.3°F | Resp 18 | Ht 67.0 in | Wt 243.0 lb

## 2015-01-27 DIAGNOSIS — F902 Attention-deficit hyperactivity disorder, combined type: Secondary | ICD-10-CM | POA: Diagnosis not present

## 2015-01-27 DIAGNOSIS — F3162 Bipolar disorder, current episode mixed, moderate: Secondary | ICD-10-CM | POA: Diagnosis not present

## 2015-01-27 MED ORDER — METHYLPHENIDATE HCL ER (LA) 40 MG PO CP24: 40.0000 mg | ORAL_CAPSULE | ORAL | Status: DC

## 2015-01-27 MED ORDER — METHYLPHENIDATE HCL ER (LA) 20 MG PO CP24: 20.0000 mg | ORAL_CAPSULE | ORAL | Status: DC

## 2015-01-27 MED ORDER — ARIPIPRAZOLE 30 MG PO TABS: 15.0000 mg | ORAL_TABLET | Freq: Every day | ORAL | Status: DC

## 2015-01-27 MED ORDER — QUETIAPINE FUMARATE 300 MG PO TABS
150.0000 mg | ORAL_TABLET | Freq: Every day | ORAL | Status: DC
Start: 2015-01-27 — End: 2015-02-10

## 2015-01-27 NOTE — Assessment & Plan Note (Signed)
Takes the medication as prescribed and indicates increased sleepiness. Discussed medication options and would like to return to previous regimen of Depakote and trazadone. Start taper down of Seroquel and abilify. Continue current dosage of lamictal. Follow up in 2 weeks for monitoring of taper or sooner if adverse symptoms arise.

## 2015-01-27 NOTE — Assessment & Plan Note (Signed)
Stable with current regimen of 60 mg Ritalin without adverse side effects. Continue current dosage of Ritalin. Follow up in 2 weeks.

## 2015-01-27 NOTE — Progress Notes (Signed)
Pre visit review using our clinic review tool, if applicable. No additional management support is needed unless otherwise documented below in the visit note. 

## 2015-01-27 NOTE — Patient Instructions (Addendum)
  Thank you for choosing [facility].  Summary/Instructions:  Your prescription(s) have been submitted to your pharmacy or been printed and provided for you. Please take as directed and contact our office if you believe you are having problem(s) with the medication(s) or have any questions.  If your symptoms worsen or fail to improve, please contact our office for further instruction, or in case of emergency go directly to the emergency room at the closest medical facility.    Decrease Abilify to 15 mg daily and Serequel to 150 mg nightly. Then stop medication.

## 2015-01-27 NOTE — Progress Notes (Signed)
Subjective:    Patient ID: Jacob [name], male    DOB: [birth year], 29 y.o.   MRN: [id number]  Chief Complaint  Patient presents with  . Follow-up    Need refill of medication     HPI:  Jacob [name] is a 29 y.o. male with a PMH of bipolar and ADHD who presents today for a follow up. His wife is present for today's visit and provides a source for the history.   1.) Bipolar - Currently maintained on Seroquel, Abilify and Lamictal. Takes the medication as prescribed. Indicates that his sleepiness has been increased with current regimen and continues to experience daily "outbursts." Wife notes that he has been very sleepy lately and he doses off throughout the day no matter what he is doing. He has also noted a significant amount of weight gain.   Wt Readings from Last 3 Encounters:  01/27/15 243 lb (110.224 kg)  11/14/14 232 lb (105.235 kg)  10/26/14 227 lb (102.967 kg)     2.) ADHD - Stable with current dosage of Ritalin. Takes the medication as prescribed and denies adverse side effects of the medication.   No Known Allergies  Current Outpatient Prescriptions on File Prior to Visit  Medication Sig Dispense Refill  . cetirizine (ZYRTEC) 10 MG tablet Take 10 mg by mouth daily.    Marland Kitchen ibuprofen (ADVIL,MOTRIN) 800 MG tablet Take 1 tablet (800 mg total) by mouth every 8 (eight) hours as needed. 30 tablet 0  . lamoTRIgine (LAMICTAL) 100 MG tablet Take 1 tablet (100 mg total) by mouth 2 (two) times daily. 60 tablet 2   No current facility-administered medications on file prior to visit.     Review of Systems  Constitutional: Negative for diaphoresis, appetite change and unexpected weight change.  Respiratory: Negative for chest tightness and shortness of breath.   Cardiovascular: Negative for chest pain, palpitations and leg swelling.  Neurological: Negative for headaches.  Psychiatric/Behavioral: Negative for sleep disturbance and decreased concentration. The patient is not  nervous/anxious.       Objective:    BP 120/78 mmHg  Pulse 81  Temp(Src) 98.3 F (36.8 C) (Oral)  Resp 18  Ht  (1.702 m)  Wt 243 lb (110.224 kg)  BMI 38.05 kg/m2  SpO2 97% Nursing note and vital signs reviewed.  Physical Exam  Constitutional: He is oriented to person, place, and time. He appears well-developed and well-nourished. No distress.  Cardiovascular: Normal rate, regular rhythm, normal heart sounds and intact distal pulses.   Pulmonary/Chest: Effort normal and breath sounds normal.  Neurological: He is alert and oriented to person, place, and time.  Skin: Skin is warm and dry.  Psychiatric: He has a normal mood and affect. His behavior is normal. Judgment and thought content normal.       Assessment & Plan:   Problem List Items Addressed This Visit      Other   Bipolar 1 disorder, mixed, moderate - Primary    Takes the medication as prescribed and indicates increased sleepiness. Discussed medication options and would like to return to previous regimen of Depakote and trazadone. Start taper down of Seroquel and abilify. Continue current dosage of lamictal. Follow up in 2 weeks for monitoring of taper or sooner if adverse symptoms arise.       Relevant Medications   QUEtiapine (SEROQUEL) 300 MG tablet   ARIPiprazole (ABILIFY) 30 MG tablet   ADHD (attention deficit hyperactivity disorder)    Stable with current regimen  of 60 mg Ritalin without adverse side effects. Continue current dosage of Ritalin. Follow up in 2 weeks.       Relevant Medications   methylphenidate (RITALIN LA) 20 MG 24 hr capsule   methylphenidate (RITALIN LA) 40 MG 24 hr capsule

## 2015-02-10 ENCOUNTER — Encounter: Payer: Self-pay | Admitting: Family

## 2015-02-10 ENCOUNTER — Ambulatory Visit (INDEPENDENT_AMBULATORY_CARE_PROVIDER_SITE_OTHER): Payer: BLUE CROSS/BLUE SHIELD | Admitting: Family

## 2015-02-10 VITALS — BP 134/78 | HR 80 | Temp 98.5°F | Resp 18 | Ht 67.0 in | Wt 241.0 lb

## 2015-02-10 DIAGNOSIS — F3162 Bipolar disorder, current episode mixed, moderate: Secondary | ICD-10-CM

## 2015-02-10 MED ORDER — TRAZODONE HCL 100 MG PO TABS: 50.0000 mg | ORAL_TABLET | Freq: Every day | ORAL | Status: DC

## 2015-02-10 NOTE — Patient Instructions (Addendum)
Please continue to take your medications as prescribed with the exceptions below:  Lacmictal weaning - 150 mg daily for 1 week (1 pill morning, 1/2 pill at night), then 100 mg daily (1/2 morning and 1/2 night), then 50 mg daily (1/2 pill daily)  Trazodone - 50-100 mg nightly as needed.  Start depakote during the final week of lamictal

## 2015-02-10 NOTE — Progress Notes (Signed)
Pre visit review using our clinic review tool, if applicable. No additional management support is needed unless otherwise documented below in the visit note. 

## 2015-02-10 NOTE — Assessment & Plan Note (Signed)
Tolerating medication changes. Discontinue Seroquel. Start trazodone. Continue current dosage of Abilify. Begin weaning Lamictal over the next 3 weeks. Follow-up in 2 weeks to determine any side effects of weaning Lamictal and begin Depakote at that time. Follow-up sooner if symptoms worsen or mood changes.

## 2015-02-10 NOTE — Progress Notes (Signed)
   Subjective:    Patient ID: Jacob [name], male    DOB: [birth year], 28 y.o.   MRN: [id number]  Chief Complaint  Patient presents with  . Follow-up    states that hes doing ok     HPI:  Jacob [name] is a 29 y.o. male with a PMH of bipolar, ADHD, and weight gain who presents today for an office follow up. His wife is present for today's visit with his permission.  1.) Bipolar - previously noted to have increased agitation and sleepiness with a past regimen. He was progressively weaned off of Seroquel and has completed the wean. His elbow Abilify has been taken down to 15 mg daily with the goal of weaning off of Seroquel and Lamictal and transitioning back to Depakote. Reports no adverse effects of decreasing dose of Seroquel in positively notes he is eating less and he is awake and more easily. He does have a newborn daughter that is approximately 53 week old.   No Known Allergies   Current Outpatient Prescriptions on File Prior to Visit  Medication Sig Dispense Refill  . ARIPiprazole (ABILIFY) 30 MG tablet Take 0.5 tablets (15 mg total) by mouth daily. 30 tablet 2  . cetirizine (ZYRTEC) 10 MG tablet Take 10 mg by mouth daily.    Marland Kitchen ibuprofen (ADVIL,MOTRIN) 800 MG tablet Take 1 tablet (800 mg total) by mouth every 8 (eight) hours as needed. 30 tablet 0  . lamoTRIgine (LAMICTAL) 100 MG tablet Take 1 tablet (100 mg total) by mouth 2 (two) times daily. 60 tablet 2  . methylphenidate (RITALIN LA) 20 MG 24 hr capsule Take 1 capsule (20 mg total) by mouth every morning. 30 capsule 0  . methylphenidate (RITALIN LA) 40 MG 24 hr capsule Take 1 capsule (40 mg total) by mouth every morning. 30 capsule 0   No current facility-administered medications on file prior to visit.    Review of Systems  Constitutional: Negative for diaphoresis and unexpected weight change.  Psychiatric/Behavioral: Negative for suicidal ideas, hallucinations, behavioral problems, sleep disturbance and agitation. The  patient is not nervous/anxious.       Objective:    BP 134/78 mmHg  Pulse 80  Temp(Src) 98.5 F (36.9 C) (Oral)  Resp 18  Ht  (1.702 m)  Wt 241 lb (109.317 kg)  BMI 37.74 kg/m2  SpO2 98% Nursing note and vital signs reviewed.  Physical Exam  Constitutional: He is oriented to person, place, and time. He appears well-developed and well-nourished. No distress.  Cardiovascular: Normal rate, regular rhythm, normal heart sounds and intact distal pulses.   Pulmonary/Chest: Effort normal and breath sounds normal.  Neurological: He is alert and oriented to person, place, and time.  Skin: Skin is warm and dry.  Psychiatric: He has a normal mood and affect. His behavior is normal. Judgment and thought content normal.       Assessment & Plan:   Problem List Items Addressed This Visit      Other   Bipolar 1 disorder, mixed, moderate - Primary    Tolerating medication changes. Discontinue Seroquel. Start trazodone. Continue current dosage of Abilify. Begin weaning Lamictal over the next 3 weeks. Follow-up in 2 weeks to determine any side effects of weaning Lamictal and begin Depakote at that time. Follow-up sooner if symptoms worsen or mood changes.      Relevant Medications   traZODone (DESYREL) 100 MG tablet

## 2015-02-13 ENCOUNTER — Ambulatory Visit: Payer: BLUE CROSS/BLUE SHIELD | Admitting: Family

## 2015-02-24 ENCOUNTER — Ambulatory Visit (INDEPENDENT_AMBULATORY_CARE_PROVIDER_SITE_OTHER): Payer: BLUE CROSS/BLUE SHIELD | Admitting: Family

## 2015-02-24 ENCOUNTER — Encounter: Payer: Self-pay | Admitting: Family

## 2015-02-24 ENCOUNTER — Ambulatory Visit: Payer: BLUE CROSS/BLUE SHIELD | Admitting: Family

## 2015-02-24 VITALS — BP 120/80 | HR 76 | Temp 98.5°F | Resp 18 | Ht 67.0 in | Wt 236.0 lb

## 2015-02-24 DIAGNOSIS — F902 Attention-deficit hyperactivity disorder, combined type: Secondary | ICD-10-CM | POA: Diagnosis not present

## 2015-02-24 DIAGNOSIS — F3162 Bipolar disorder, current episode mixed, moderate: Secondary | ICD-10-CM

## 2015-02-24 MED ORDER — METHYLPHENIDATE HCL ER (LA) 40 MG PO CP24: 40.0000 mg | ORAL_CAPSULE | ORAL | Status: DC

## 2015-02-24 MED ORDER — DIVALPROEX SODIUM ER 500 MG PO TB24: 500.0000 mg | ORAL_TABLET | Freq: Every day | ORAL | Status: DC

## 2015-02-24 MED ORDER — METHYLPHENIDATE HCL ER (LA) 20 MG PO CP24: 20.0000 mg | ORAL_CAPSULE | ORAL | Status: DC

## 2015-02-24 NOTE — Progress Notes (Signed)
Pre visit review using our clinic review tool, if applicable. No additional management support is needed unless otherwise documented below in the visit note. 

## 2015-02-24 NOTE — Patient Instructions (Addendum)
Thank you for choosing [facility].  Summary/Instructions:  Please start taking the Depakote daily.  Please continue other medications.   Your prescription(s) have been submitted to your pharmacy or been printed and provided for you. Please take as directed and contact our office if you believe you are having problem(s) with the medication(s) or have any questions.  If your symptoms worsen or fail to improve, please contact our office for further instruction, or in case of emergency go directly to the emergency room at the closest medical facility.   Valproic Acid, Divalproex Sodium delayed or extended-release tablets What is this medicine? DIVALPROEX SODIUM (dye VAL pro ex SO dee um) is used to prevent seizures caused by some forms of epilepsy. It is also used to treat bipolar mania and to prevent migraine headaches. This medicine may be used for other purposes; ask your health care provider or pharmacist if you have questions. COMMON BRAND NAME(S): Depakote, Depakote ER What should I tell my health care provider before I take this medicine? They need to know if you have any of these conditions: -blood disease -brain damage or disease -kidney disease -liver disease -low blood proteins -mitochondrial disease -suicidal thoughts, plans, or attempt; a previous suicide attempt by you or a family member -urea cycle disorder (UCD) -an unusual or allergic reaction to divalproex sodium, other medicines, foods, dyes, or preservatives -pregnant or trying to get pregnant -breast-feeding How should I use this medicine? Take this medicine by mouth with a drink of water. Follow the directions on the prescription label. Do not crush or chew. If this medicine upsets your stomach, take it with food or milk. Take your medicine at regular intervals. Do not take it more often than directed. Talk to your pediatrician regarding the use of this medicine in children. Special care may be  needed. Overdosage: If you think you have taken too much of this medicine contact a poison control center or emergency room at once. NOTE: This medicine is only for you. Do not share this medicine with others. What if I miss a dose? If you miss a dose, take it as soon as you can. If it is almost time for your next dose, take only that dose. Do not take double or extra doses. What may interact with this medicine? -aspirin -barbiturates, like phenobarbital -diazepam -isoniazid -medicines for depression, anxiety, or psychotic disturbances -medicines that treat or prevent blood clots like warfarin -meropenem -other seizure medicines -rifampin -tolbutamide -zidovudine This list may not describe all possible interactions. Give your health care provider a list of all the medicines, herbs, non-prescription drugs, or dietary supplements you use. Also tell them if you smoke, drink alcohol, or use illegal drugs. Some items may interact with your medicine. What should I watch for while using this medicine? Visit your doctor or health care professional for regular checks on your progress. If you are taking this medicine to treat epilepsy (seizures), do not stop taking it suddenly. This increases the risk of seizures. Wear a medical identification bracelet or chain to say you have epilepsy or seizures, and carry a card that lists all your medicines. You may get drowsy, dizzy, or have blurred vision. Do not drive, use machinery, or do anything that needs mental alertness until you know how this medicine affects you. To reduce dizzy or fainting spells, do not sit or stand up quickly, especially if you are an older patient. Alcohol can increase drowsiness and dizziness. Avoid alcoholic drinks. This medicine can cause blood problems.  This can mean slow healing and a risk of infection. Problems can arise if you need dental work, and in the day to day care of your teeth. Try to avoid damage to your teeth and gums  when you brush or floss your teeth. This medicine can make you more sensitive to the sun. Keep out of the sun. If you cannot avoid being in the sun, wear protective clothing and use sunscreen. Do not use sun lamps or tanning beds/booths. The use of this medicine may increase the chance of suicidal thoughts or actions. Pay special attention to how you are responding while on this medicine. Any worsening of mood, or thoughts of suicide or dying should be reported to your health care professional right away. Women who become pregnant while using this medicine may enroll in the [place] Antiepileptic Drug Pregnancy Registry by calling 2063817582. This registry collects information about the safety of antiepileptic drug use during pregnancy. Contact your doctor or healthcare professional if you notice any part of your medicine in your stool. Your healthcare provider may want to check the amount of medicine in your blood if this happens. What side effects may I notice from receiving this medicine? Side effects that you should report to your doctor or health care professional as soon as possible: -allergic reactions like skin rash, itching or hives, swelling of the face, lips, or tongue -changes in the frequency or severity of seizures -double vision or uncontrollable eye movements -nausea and vomiting -redness, blistering, peeling or loosening of the skin, including inside the mouth -stomach pain or cramps -trembling of hands or arms -unusual bleeding or bruising or pinpoint red spots on the skin -unusual swelling of the arms or legs -unusually weak or tired -worsening of mood, thoughts or actions of suicide or dying -yellowing of skin or eyes Side effects that usually do not require medical attention (report to your doctor or health care professional if they continue or are bothersome): -change in menstrual cycle -diarrhea or constipation -headache -loss of bladder control -loss of hair  or unusual growth of hair -loss or increase in appetite -weight gain or loss This list may not describe all possible side effects. Call your doctor for medical advice about side effects. You may report side effects to FDA at 1-800-FDA-1088. Where should I keep my medicine? Keep out of reach of children. Store at room temperature between 15 and 30 degrees C (59 and 86 degrees F). Keep container tightly closed. Throw away any unused medicine after the expiration date. NOTE: This sheet is a summary. It may not cover all possible information. If you have questions about this medicine, talk to your doctor, pharmacist, or health care provider.  2015, Elsevier/Gold Standard. (2012-02-18 11:50:42)  Hydronephrosis Hydronephrosis is an abnormal enlargement of your kidney. It can affect one or both the kidneys. It results from the backward pressure of urine on the kidneys, when the flow of urine is blocked. Normally, the urine drains from the kidney through the urine tube (ureter), into a sac which holds the urine until urination (bladder). When the urinary flow is blocked, the urine collects above the block. This causes an increase in the pressure inside the kidney, which in turn leads to its enlargement. The block can occur at the point where the kidney joins the ureter. Treatment depends on the cause and location of the block.  CAUSES  The causes of this condition include:  Birth defect of the kidney or ureter.  Kink at the point  where the kidney joins the ureter.  Stones and blood clots in the kidney or ureter.  Cancer, injury, or infection of the ureter.  Scar tissue formation.  Backflow of urine (reflux).  Cancer of bladder or prostate gland.  Abnormality of the nerves or muscles of the kidney or ureter.  Lower part of the ureter protruding into the bladder (ureterocele).  Abnormal contractions of the bladder.  Both the kidneys can be affected during pregnancy. This is because the  enlarging uterus presses on the ureters and blocks the flow of urine. SYMPTOMS  The symptoms depend on the location of the block. They also depend on how long the block has been present. You may feel pain on the affected side. Sometimes, you may not have any symptoms. There may be a dull ache or discomfort in the flank. The common symptoms are:  Flank pain.  Swelling of the abdomen.  Pain in the abdomen.  Nausea and vomiting.  Fever.  Pain while passing urine.  Urgency for urination.  Frequent or urgent urination.  Infection of the urinary tract. DIAGNOSIS  Your caregiver will examine you after asking about your symptoms. You may be asked to do blood and urine tests. Your caregiver may order a special X-ray, ultrasound, or CT scan. Sometimes a rigid or flexible telescope (cystoscope) is used to view the site of the blockage.  TREATMENT  Treatment depends on the site, cause, and duration of the block. The goal of treatment is to remove the blockage. Your caregiver will plan the treatment based on your condition. The different types of treatment are:   Putting in a soft plastic tube (ureteral stent) to connect the bladder with the kidney. This will help in draining the urine.  Putting in a soft tube (nephrostomy tube). This is placed through skin into the kidney. The trapped urine is drained out through the back. A plastic bag is attached to your skin to hold the urine that has drained out.  Antibiotics to treat or prevent infection.  Breaking down of the stone (lithotripsy). HOME CARE INSTRUCTIONS   It may take some time for the hydronephrosis to go away (resolve). Drink fluids as directed by your caregiver , and get a lot of rest.  If you have a drain in, your caregiver will give you directions about how to care for it. Be sure you understand these directions completely before you go home.  Take any antibiotics, pain medications, or other prescriptions exactly as  prescribed.  Follow-up with your caregivers as directed. SEEK MEDICAL CARE IF:   You continue to have flank pain, nausea, or difficulty with urination.  You have any problem with any type of drainage device.  Your urine becomes cloudy or bloody. SEEK IMMEDIATE MEDICAL CARE IF:   You have severe flank and/or abdominal pain.  You develop vomiting and are unable to hold down fluids.  You develop a fever above 100.5 F (38.1 C), or as per your caregiver. MAKE SURE YOU:   Understand these instructions.  Will watch your condition.  Will get help right away if you are not doing well or get worse. Document Released: 04/21/2007 Document Revised: 09/16/2011 Document Reviewed: 06/07/2010 [facility] Patient Information 2015 [place], [place]. This information is not intended to replace advice given to you by your health care provider. Make sure you discuss any questions you have with your health care provider.

## 2015-02-24 NOTE — Assessment & Plan Note (Signed)
Attention is stable with current dosage of Ritalin. Denies adverse side effects or cardiovascular effects. Continue current dosage of ritalin.

## 2015-02-24 NOTE — Assessment & Plan Note (Signed)
Lamictal wean successful. Continue current dosages of trazadone and Abilify. Start Depakote for mood stabilization. Follow up in 1 month to assess mood state with addition of depakote.

## 2015-02-24 NOTE — Progress Notes (Signed)
   Subjective:    Patient ID: Jacob [name], male    DOB: 11/11/1985, 29 y.o.   MRN: [id number]  Chief Complaint  Patient presents with  . Follow-up    HPI:  Jacob [name] is a 29 y.o. male with a PMH of bipolar and ADHD who presents today for an office follow up.   1.) Bipolar disorder - Weaning off lamicatal successfully without any side effect or complications. Continues to be managed on Abilify and Trazadone. Takes the medications as prescribed and denies adverse side effects. Reports that he is sleeping a little bit less but is more easily arousable and has lost weight. Reports no manic behavior.  No Known Allergies  Current Outpatient Prescriptions on File Prior to Visit  Medication Sig Dispense Refill  . ARIPiprazole (ABILIFY) 30 MG tablet Take 0.5 tablets (15 mg total) by mouth daily. 30 tablet 2  . cetirizine (ZYRTEC) 10 MG tablet Take 10 mg by mouth daily.    Marland Kitchen ibuprofen (ADVIL,MOTRIN) 800 MG tablet Take 1 tablet (800 mg total) by mouth every 8 (eight) hours as needed. 30 tablet 0  . traZODone (DESYREL) 100 MG tablet Take 0.5-1 tablets (50-100 mg total) by mouth at bedtime. 30 tablet 1   No current facility-administered medications on file prior to visit.     Review of Systems  Constitutional: Negative for fever and chills.  Psychiatric/Behavioral: Negative for suicidal ideas, hallucinations, behavioral problems, sleep disturbance, dysphoric mood and decreased concentration. The patient is not nervous/anxious and is not hyperactive.       Objective:    BP 120/80 mmHg  Pulse 76  Temp(Src) 98.5 F (36.9 C) (Oral)  Resp 18  Ht  (1.702 m)  Wt 236 lb (107.049 kg)  BMI 36.95 kg/m2  SpO2 95% Nursing note and vital signs reviewed.  Physical Exam  Constitutional: He is oriented to person, place, and time. He appears well-developed and well-nourished. No distress.  Cardiovascular: Normal rate, regular rhythm, normal heart sounds and intact distal pulses.     Pulmonary/Chest: Effort normal and breath sounds normal.  Neurological: He is alert and oriented to person, place, and time.  Skin: Skin is warm and dry.  Psychiatric: He has a normal mood and affect. His behavior is normal. Judgment and thought content normal.       Assessment & Plan:   Problem List Items Addressed This Visit      Other   Bipolar 1 disorder, mixed, moderate    Lamictal wean successful. Continue current dosages of trazadone and Abilify. Start Depakote for mood stabilization. Follow up in 1 month to assess mood state with addition of depakote.       Relevant Medications   divalproex (DEPAKOTE ER) 500 MG 24 hr tablet   ADHD (attention deficit hyperactivity disorder) - Primary    Attention is stable with current dosage of Ritalin. Denies adverse side effects or cardiovascular effects. Continue current dosage of ritalin.       Relevant Medications   methylphenidate (RITALIN LA) 40 MG 24 hr capsule   methylphenidate (RITALIN LA) 20 MG 24 hr capsule

## 2015-03-23 ENCOUNTER — Encounter: Payer: Self-pay | Admitting: Family

## 2015-03-23 DIAGNOSIS — F902 Attention-deficit hyperactivity disorder, combined type: Secondary | ICD-10-CM

## 2015-03-24 MED ORDER — ESCITALOPRAM OXALATE 10 MG PO TABS: 10.0000 mg | ORAL_TABLET | Freq: Every day | ORAL | Status: DC

## 2015-03-27 MED ORDER — METHYLPHENIDATE HCL ER (LA) 20 MG PO CP24: 20.0000 mg | ORAL_CAPSULE | ORAL | Status: DC

## 2015-03-27 MED ORDER — METHYLPHENIDATE HCL ER (LA) 40 MG PO CP24: 40.0000 mg | ORAL_CAPSULE | ORAL | Status: DC

## 2015-03-27 NOTE — Addendum Note (Signed)
Addended by: Jeanine Luz D on: 03/27/2015 12:23 PM   Modules accepted: Orders

## 2015-03-28 ENCOUNTER — Ambulatory Visit: Payer: BLUE CROSS/BLUE SHIELD | Admitting: Family

## 2015-04-11 ENCOUNTER — Other Ambulatory Visit: Payer: Self-pay | Admitting: Family

## 2015-04-23 ENCOUNTER — Other Ambulatory Visit: Payer: Self-pay | Admitting: Family

## 2015-04-24 NOTE — Telephone Encounter (Signed)
Last refill was 8/19

## 2015-05-31 ENCOUNTER — Other Ambulatory Visit: Payer: Self-pay | Admitting: Family

## 2015-06-05 MED ORDER — ARIPIPRAZOLE 30 MG PO TABS: 30.0000 mg | ORAL_TABLET | Freq: Every day | ORAL | Status: DC

## 2015-06-05 MED ORDER — DIVALPROEX SODIUM ER 500 MG PO TB24: 500.0000 mg | ORAL_TABLET | Freq: Every day | ORAL | Status: DC

## 2015-06-05 NOTE — Addendum Note (Signed)
Addended by: Jeanine LuzALONE, GREGORY D on: 06/05/2015 05:25 PM   Modules accepted: Orders, Medications

## 2015-06-07 ENCOUNTER — Other Ambulatory Visit: Payer: Self-pay | Admitting: Family

## 2015-06-07 ENCOUNTER — Encounter: Payer: Self-pay | Admitting: Family

## 2015-06-07 MED ORDER — METHYLPHENIDATE HCL ER (LA) 60 MG PO CP24: 60.0000 mg | ORAL_CAPSULE | Freq: Every day | ORAL | Status: DC

## 2015-07-10 ENCOUNTER — Encounter: Payer: Self-pay | Admitting: Family

## 2015-07-17 ENCOUNTER — Ambulatory Visit: Payer: BLUE CROSS/BLUE SHIELD | Admitting: Family

## 2015-07-18 ENCOUNTER — Ambulatory Visit: Payer: BLUE CROSS/BLUE SHIELD | Admitting: Family

## 2015-07-21 ENCOUNTER — Other Ambulatory Visit: Payer: Self-pay | Admitting: Family

## 2015-07-24 NOTE — Telephone Encounter (Signed)
Last refill on lexapro 9/16 and depakote 11/26

## 2015-07-25 ENCOUNTER — Ambulatory Visit (INDEPENDENT_AMBULATORY_CARE_PROVIDER_SITE_OTHER): Payer: BLUE CROSS/BLUE SHIELD | Admitting: Family

## 2015-07-25 ENCOUNTER — Encounter: Payer: Self-pay | Admitting: Family

## 2015-07-25 VITALS — BP 120/80 | HR 77 | Temp 98.4°F | Resp 18 | Ht 67.0 in | Wt 237.0 lb

## 2015-07-25 DIAGNOSIS — F3162 Bipolar disorder, current episode mixed, moderate: Secondary | ICD-10-CM

## 2015-07-25 DIAGNOSIS — F902 Attention-deficit hyperactivity disorder, combined type: Secondary | ICD-10-CM

## 2015-07-25 MED ORDER — ARIPIPRAZOLE 30 MG PO TABS: 30.0000 mg | ORAL_TABLET | Freq: Every day | ORAL | Status: DC

## 2015-07-25 MED ORDER — DIVALPROEX SODIUM ER 500 MG PO TB24: 500.0000 mg | ORAL_TABLET | Freq: Every day | ORAL | Status: DC

## 2015-07-25 MED ORDER — ESCITALOPRAM OXALATE 10 MG PO TABS: 10.0000 mg | ORAL_TABLET | Freq: Every day | ORAL | Status: DC

## 2015-07-25 MED ORDER — LISDEXAMFETAMINE DIMESYLATE 30 MG PO CAPS: 30.0000 mg | ORAL_CAPSULE | Freq: Every day | ORAL | Status: DC

## 2015-07-25 NOTE — Assessment & Plan Note (Signed)
ADHD is well controlled with current regimen of Ritalin, however patient wishes to switch back to Vyvanse as he believes it worked better for him in the past. He obtains adequate sleep, denies significant weight loss or changes in appetite or cardiac symptoms. Discontinue Ritalin. Start Vyvanse. Follow-up in one month to determine effectiveness or need for titration.

## 2015-07-25 NOTE — Patient Instructions (Addendum)
Thank you for choosing [facility].  Summary/Instructions:  Your prescription(s) have been submitted to your pharmacy or been printed and provided for you. Please take as directed and contact our office if you believe you are having problem(s) with the medication(s) or have any questions.  If your symptoms worsen or fail to improve, please contact our office for further instruction, or in case of emergency go directly to the emergency room at the closest medical facility.   Please continue to take medications as prescribed.  

## 2015-07-25 NOTE — Assessment & Plan Note (Signed)
Bipolar is well controlled with current regimen and denies agitation. Takes the medication as prescribed and denies adverse side effects. Continue current dosage of trazodone, Lexapro, Depakote, and Abilify. Follow-up in 6 months or sooner if needed.

## 2015-07-25 NOTE — Progress Notes (Signed)
Subjective:    Patient ID: Jacob [name], male    DOB: December 31, 1985, 30 y.o.   MRN: [id number]  Chief Complaint  Patient presents with  . Follow-up    new insurance and wants to try to go back to vyvanse    HPI:  Jacob [name] is a 30 y.o. male who  has a past medical history of Bipolar disorder (HCC); Allergy; and ADHD (attention deficit hyperactivity disorder) evaluation. and presents today for a follow up office visit.  1.) ADHD - Currently maintained on Ritilan LA . Takes the medication as prescribed and denies adverse side effects. Averaging approximately 8+  hours of sleep night. Denies changes in appetite or significant weight loss.  2.) Bipolar - Currently maintained on Abilify, Depakote, Lexapro and Trazodone. Takes the medication as prescribed and denies adverse side effects. Reports that his mood has been stable and the agitation has also been decreased.   No Known Allergies   Current Outpatient Prescriptions on File Prior to Visit  Medication Sig Dispense Refill  . cetirizine (ZYRTEC) 10 MG tablet Take 10 mg by mouth daily.    Marland Kitchen ibuprofen (ADVIL,MOTRIN) 800 MG tablet Take 1 tablet (800 mg total) by mouth every 8 (eight) hours as needed. 30 tablet 0  . traZODone (DESYREL) 100 MG tablet Take 0.5-1 tablets (50-100 mg total) by mouth at bedtime. 30 tablet 1   No current facility-administered medications on file prior to visit.    Review of Systems  Constitutional: Negative for diaphoresis, appetite change and unexpected weight change.  Respiratory: Negative for chest tightness and shortness of breath.   Cardiovascular: Negative for chest pain, palpitations and leg swelling.  Neurological: Negative for headaches.  Psychiatric/Behavioral: Negative for sleep disturbance and decreased concentration. The patient is not nervous/anxious.       Objective:    BP 120/80 mmHg  Pulse 77  Temp(Src) 98.4 F (36.9 C) (Oral)  Resp 18  Ht  (1.702 m)  Wt 237 lb (107.502 kg)   BMI 37.11 kg/m2  SpO2 96% Nursing note and vital signs reviewed.  Physical Exam  Constitutional: He is oriented to person, place, and time. He appears well-developed and well-nourished. No distress.  Cardiovascular: Normal rate, regular rhythm, normal heart sounds and intact distal pulses.   Pulmonary/Chest: Effort normal and breath sounds normal.  Neurological: He is alert and oriented to person, place, and time.  Skin: Skin is warm and dry.  Psychiatric: He has a normal mood and affect. His behavior is normal. Judgment and thought content normal.       Assessment & Plan:   Problem List Items Addressed This Visit      Other   Bipolar 1 disorder, mixed, moderate (HCC)    Bipolar is well controlled with current regimen and denies agitation. Takes the medication as prescribed and denies adverse side effects. Continue current dosage of trazodone, Lexapro, Depakote, and Abilify. Follow-up in 6 months or sooner if needed.      Relevant Medications   divalproex (DEPAKOTE ER) 500 MG 24 hr tablet   escitalopram (LEXAPRO) 10 MG tablet   ARIPiprazole (ABILIFY) 30 MG tablet   ADHD (attention deficit hyperactivity disorder) - Primary    ADHD is well controlled with current regimen of Ritalin, however patient wishes to switch back to Vyvanse as he believes it worked better for him in the past. He obtains adequate sleep, denies significant weight loss or changes in appetite or cardiac symptoms. Discontinue Ritalin. Start Vyvanse. Follow-up in  one month to determine effectiveness or need for titration.      Relevant Medications   lisdexamfetamine (VYVANSE) 30 MG capsule

## 2015-07-25 NOTE — Progress Notes (Signed)
Pre visit review using our clinic review tool, if applicable. No additional management support is needed unless otherwise documented below in the visit note. 

## 2015-08-15 ENCOUNTER — Encounter: Payer: Self-pay | Admitting: Family

## 2015-08-29 ENCOUNTER — Other Ambulatory Visit: Payer: Self-pay | Admitting: Family

## 2015-08-29 DIAGNOSIS — F902 Attention-deficit hyperactivity disorder, combined type: Secondary | ICD-10-CM

## 2015-08-29 MED ORDER — LISDEXAMFETAMINE DIMESYLATE 40 MG PO CAPS: 40.0000 mg | ORAL_CAPSULE | Freq: Every day | ORAL | Status: DC

## 2015-09-13 ENCOUNTER — Encounter: Payer: Self-pay | Admitting: Family

## 2015-09-13 DIAGNOSIS — F902 Attention-deficit hyperactivity disorder, combined type: Secondary | ICD-10-CM

## 2015-09-18 MED ORDER — LISDEXAMFETAMINE DIMESYLATE 50 MG PO CAPS: 50.0000 mg | ORAL_CAPSULE | Freq: Every day | ORAL | Status: DC

## 2015-10-24 ENCOUNTER — Encounter: Payer: Self-pay | Admitting: Family

## 2015-10-24 ENCOUNTER — Other Ambulatory Visit: Payer: BLUE CROSS/BLUE SHIELD

## 2015-10-24 ENCOUNTER — Ambulatory Visit (INDEPENDENT_AMBULATORY_CARE_PROVIDER_SITE_OTHER): Payer: BLUE CROSS/BLUE SHIELD | Admitting: Family

## 2015-10-24 VITALS — BP 110/70 | HR 73 | Temp 98.2°F | Resp 16 | Ht 67.0 in | Wt 237.0 lb

## 2015-10-24 DIAGNOSIS — F902 Attention-deficit hyperactivity disorder, combined type: Secondary | ICD-10-CM

## 2015-10-24 DIAGNOSIS — F3162 Bipolar disorder, current episode mixed, moderate: Secondary | ICD-10-CM | POA: Diagnosis not present

## 2015-10-24 MED ORDER — LISDEXAMFETAMINE DIMESYLATE 60 MG PO CAPS: 60.0000 mg | ORAL_CAPSULE | Freq: Every day | ORAL | Status: DC

## 2015-10-24 NOTE — Progress Notes (Signed)
Subjective:    Patient ID: Jacob [name], male    DOB: May 25, 1986, 30 y.o.   MRN: [id number]  Chief Complaint  Patient presents with  . Follow-up    wife says that the medications that he is on right now are not working at all and they want to make some medication changes    HPI:  Jacob [name] is a 30 y.o. male who  has a past medical history of Bipolar disorder (HCC); Allergy; and ADHD (attention deficit hyperactivity disorder) evaluation. and presents today For a follow-up office visit.  1.) Bipolar - currently maintained on Abilify, Depakote, and Lexapro. Reports taking the medication as prescribed and denies adverse side effects. Wife indicates that the medications are not working at all and noticing symptoms of agitation and sleepiness. Reports that when he gets home from work he eats dinner and goes to bed. He has not been very interactive with his child. One episode described as keeping his wife's laptop from her for no reason.  2.) ADHD - Currently managed on Vyvanse. Reports taking the medication as prescribed and denies adverse side effects. Averaging about 8-10 hours of sleep per night. Indicates that his weight is stable.  No Known Allergies   Current Outpatient Prescriptions on File Prior to Visit  Medication Sig Dispense Refill  . cetirizine (ZYRTEC) 10 MG tablet Take 10 mg by mouth daily.    . divalproex (DEPAKOTE ER) 500 MG 24 hr tablet Take 1 tablet (500 mg total) by mouth daily. 90 tablet 1  . escitalopram (LEXAPRO) 10 MG tablet Take 1 tablet (10 mg total) by mouth daily. 90 tablet 1  . ibuprofen (ADVIL,MOTRIN) 800 MG tablet Take 1 tablet (800 mg total) by mouth every 8 (eight) hours as needed. 30 tablet 0   No current facility-administered medications on file prior to visit.    Review of Systems  Constitutional: Negative for fever and chills.  Respiratory: Negative for chest tightness and shortness of breath.   Neurological: Positive for dizziness. Negative  for weakness and light-headedness.  Psychiatric/Behavioral: Positive for behavioral problems and agitation. Negative for suicidal ideas and sleep disturbance. The patient is not hyperactive.       Objective:    BP 110/70 mmHg  Pulse 73  Temp(Src) 98.2 F (36.8 C) (Oral)  Resp 16  Ht 5\' 7"  (1.702 m)  Wt 237 lb (107.502 kg)  BMI 37.11 kg/m2  SpO2 97% Nursing note and vital signs reviewed.  Physical Exam  Constitutional: He is oriented to person, place, and time. He appears well-developed and well-nourished. No distress.  Cardiovascular: Normal rate, regular rhythm, normal heart sounds and intact distal pulses.   Pulmonary/Chest: Effort normal and breath sounds normal.  Neurological: He is alert and oriented to person, place, and time.  Skin: Skin is warm and dry.  Psychiatric: He has a normal mood and affect. His behavior is normal. Judgment and thought content normal.       Assessment & Plan:   Problem List Items Addressed This Visit      Other   Bipolar 1 disorder, mixed, moderate (HCC) - Primary    Bipolar remains labile with episodes of mania. Obtain valproic acid level. Discontinue abilify. Continue current dosage of depakote and lexapro. Follow up in 1 month and pending depakote level results.       Relevant Orders   Valproic Acid level   Ambulatory referral to Psychiatry   ADHD (attention deficit hyperactivity disorder)    Appears stable  with some room for improvement in attention. Increase Vyvanse. No adverse side effects noted. [place] Controlled Substance Database reviewed with no irregularities. Follow up in 1 month.       Relevant Medications   lisdexamfetamine (VYVANSE) 60 MG capsule       I have discontinued Mr. Anagnos's traZODone and ARIPiprazole. I have also changed his lisdexamfetamine. Additionally, I am having him maintain his cetirizine, ibuprofen, divalproex, and escitalopram.   Meds ordered this encounter  Medications  . lisdexamfetamine (VYVANSE)  60 MG capsule    Sig: Take 1 capsule (60 mg total) by mouth daily.    Dispense:  30 capsule    Refill:  0    Order Specific Question:  Supervising Provider    Answer:  Hillard Danker A [[id number]]     Follow-up: Return in about 1 month (around 11/23/2015).  Jeanine Luz, FNP

## 2015-10-24 NOTE — Progress Notes (Signed)
Pre visit review using our clinic review tool, if applicable. No additional management support is needed unless otherwise documented below in the visit note. 

## 2015-10-24 NOTE — Assessment & Plan Note (Signed)
Appears stable with some room for improvement in attention. Increase Vyvanse. No adverse side effects noted. [place] Controlled Substance Database reviewed with no irregularities. Follow up in 1 month.

## 2015-10-24 NOTE — Assessment & Plan Note (Signed)
Bipolar remains labile with episodes of mania. Obtain valproic acid level. Discontinue abilify. Continue current dosage of depakote and lexapro. Follow up in 1 month and pending depakote level results.

## 2015-10-24 NOTE — Patient Instructions (Signed)
Thank you for choosing [facility].  Summary/Instructions:  Please continue to take your medications as prescribed.   Your prescription(s) have been submitted to your pharmacy or been printed and provided for you. Please take as directed and contact our office if you believe you are having problem(s) with the medication(s) or have any questions.  Please stop by the lab on the basement level of the building for your blood work. Your results will be released to MyChart (or called to you) after review, usually within 72 hours after test completion. If any changes need to be made, you will be notified at that same time.  If your symptoms worsen or fail to improve, please contact our office for further instruction, or in case of emergency go directly to the emergency room at the closest medical facility.     

## 2015-10-25 ENCOUNTER — Encounter: Payer: Self-pay | Admitting: Family

## 2015-10-25 ENCOUNTER — Other Ambulatory Visit: Payer: Self-pay | Admitting: Family

## 2015-10-25 LAB — VALPROIC ACID LEVEL: Valproic Acid Lvl: 31.2 ug/mL — ABNORMAL LOW (ref 50.0–100.0)

## 2015-10-25 MED ORDER — DIVALPROEX SODIUM ER 500 MG PO TB24: 1000.0000 mg | ORAL_TABLET | Freq: Every day | ORAL | Status: DC

## 2015-10-27 MED ORDER — ALBUTEROL SULFATE HFA 108 (90 BASE) MCG/ACT IN AERS: 2.0000 | INHALATION_SPRAY | Freq: Four times a day (QID) | RESPIRATORY_TRACT | Status: DC | PRN

## 2015-11-23 ENCOUNTER — Other Ambulatory Visit: Payer: BLUE CROSS/BLUE SHIELD

## 2015-11-23 ENCOUNTER — Ambulatory Visit (INDEPENDENT_AMBULATORY_CARE_PROVIDER_SITE_OTHER): Payer: BLUE CROSS/BLUE SHIELD | Admitting: Family

## 2015-11-23 ENCOUNTER — Encounter: Payer: Self-pay | Admitting: Family

## 2015-11-23 VITALS — BP 120/78 | HR 80 | Temp 98.2°F | Resp 16 | Ht 67.0 in | Wt 236.0 lb

## 2015-11-23 DIAGNOSIS — F902 Attention-deficit hyperactivity disorder, combined type: Secondary | ICD-10-CM | POA: Diagnosis not present

## 2015-11-23 DIAGNOSIS — Z5181 Encounter for therapeutic drug level monitoring: Secondary | ICD-10-CM | POA: Diagnosis not present

## 2015-11-23 DIAGNOSIS — F3162 Bipolar disorder, current episode mixed, moderate: Secondary | ICD-10-CM | POA: Diagnosis not present

## 2015-11-23 MED ORDER — IBUPROFEN 800 MG PO TABS: 800.0000 mg | ORAL_TABLET | Freq: Three times a day (TID) | ORAL | Status: DC | PRN

## 2015-11-23 MED ORDER — ESCITALOPRAM OXALATE 10 MG PO TABS: 10.0000 mg | ORAL_TABLET | Freq: Every day | ORAL | Status: DC

## 2015-11-23 MED ORDER — LISDEXAMFETAMINE DIMESYLATE 70 MG PO CAPS
70.0000 mg | ORAL_CAPSULE | Freq: Every day | ORAL | Status: DC
Start: 2015-11-23 — End: 2015-12-14

## 2015-11-23 NOTE — Progress Notes (Signed)
Pre visit review using our clinic review tool, if applicable. No additional management support is needed unless otherwise documented below in the visit note. 

## 2015-11-23 NOTE — Progress Notes (Signed)
Subjective:    Patient ID: Jacob [name], male    DOB: Nov 30, 1985, 30 y.o.   MRN: [id number]  Chief Complaint  Patient presents with  . Medication follow up    states that medications are working better for him, would like ibuprofen 800 mg for shoulder pain    HPI:  Jacob [name] is a 30 y.o. male who  has a past medical history of Bipolar disorder (HCC); Allergy; and ADHD (attention deficit hyperactivity disorder) evaluation. and presents today for a follow up office.  1.) Bipolar - Currently maintained on divalproex and escitalopram. Reports taking the medication as prescribed and denies adverse side effects. Describes less agitation and more stabilized mood. Sleep has decreased some and he has become more functional.   2.) ADD - Currently maintained on Vyvanse. Reports taking the medication as prescribed and denies adverse side effects. Sleeping on average about hours per night. No significant weight loss or appeitite decrease. Denies chest pain, shortness of breath or heart palpitations. Symptoms are generally well controlled but would like to try going up to 70 mg.   No Known Allergies   Current Outpatient Prescriptions on File Prior to Visit  Medication Sig Dispense Refill  . albuterol (PROVENTIL HFA;VENTOLIN HFA) 108 (90 Base) MCG/ACT inhaler Inhale 2 puffs into the lungs every 6 (six) hours as needed for wheezing or shortness of breath. 1 Inhaler 2  . cetirizine (ZYRTEC) 10 MG tablet Take 10 mg by mouth daily.    . divalproex (DEPAKOTE ER) 500 MG 24 hr tablet Take 2 tablets (1,000 mg total) by mouth daily. 60 tablet 1   No current facility-administered medications on file prior to visit.    Review of Systems  Constitutional: Negative for diaphoresis, appetite change and unexpected weight change.  Respiratory: Negative for chest tightness and shortness of breath.   Cardiovascular: Negative for chest pain, palpitations and leg swelling.  Neurological: Negative for  headaches.  Psychiatric/Behavioral: Negative for suicidal ideas, sleep disturbance, decreased concentration and agitation. The patient is not nervous/anxious.       Objective:    BP 120/78 mmHg  Pulse 80  Temp(Src) 98.2 F (36.8 C) (Oral)  Resp 16  Ht  (1.702 m)  Wt 236 lb (107.049 kg)  BMI 36.95 kg/m2  SpO2 97% Nursing note and vital signs reviewed.  Physical Exam  Constitutional: He is oriented to person, place, and time. He appears well-developed and well-nourished. No distress.  Cardiovascular: Normal rate, regular rhythm, normal heart sounds and intact distal pulses.   Pulmonary/Chest: Effort normal and breath sounds normal.  Neurological: He is alert and oriented to person, place, and time.  Skin: Skin is warm and dry.  Psychiatric: He has a normal mood and affect. His behavior is normal. Judgment and thought content normal. His mood appears not anxious. He is not agitated. Cognition and memory are normal.       Assessment & Plan:   Problem List Items Addressed This Visit      Other   Bipolar 1 disorder, mixed, moderate (HCC) - Primary    Symptoms of bipolar appear with improved control with increased Depakote and current Lexapro. Denies adverse side effects. Notes mood is improved. Obtain Depakote level. Continue current dosage of Depakote and Lexapro. Follow-up pending Depakote levels.      Relevant Medications   escitalopram (LEXAPRO) 10 MG tablet   Other Relevant Orders   Valproic Acid level   ADHD (attention deficit hyperactivity disorder)    Concentration  is adequately controlled with some room for improvements with current dosage of Vyvanse. Increase Vyvanse. No adverse side effects noted. Sleeping and eating well with no cardiac symptoms. [place] controlled substance database reviewed with no irregularities. Will be due for urine drug screen in the near future.       Relevant Medications   lisdexamfetamine (VYVANSE) 70 MG capsule    Other Visit  Diagnoses    Therapeutic drug monitoring        Relevant Orders    Valproic Acid level        I have changed Mr. Stanly's lisdexamfetamine. I am also having him maintain his cetirizine, divalproex, albuterol, ibuprofen, and escitalopram.   Meds ordered this encounter  Medications  . ibuprofen (ADVIL,MOTRIN) 800 MG tablet    Sig: Take 1 tablet (800 mg total) by mouth every 8 (eight) hours as needed.    Dispense:  90 tablet    Refill:  0    Order Specific Question:  Supervising Provider    Answer:  Hillard DankerRAWFORD, ELIZABETH A [[id number]]  . lisdexamfetamine (VYVANSE) 70 MG capsule    Sig: Take 1 capsule (70 mg total) by mouth daily.    Dispense:  30 capsule    Refill:  0    Order Specific Question:  Supervising Provider    Answer:  Hillard DankerRAWFORD, ELIZABETH A [[id number]]  . escitalopram (LEXAPRO) 10 MG tablet    Sig: Take 1 tablet (10 mg total) by mouth daily.    Dispense:  90 tablet    Refill:  1    Order Specific Question:  Supervising Provider    Answer:  Hillard DankerRAWFORD, ELIZABETH A [[id number]]     Follow-up: Return in about 1 month (around 12/24/2015).  Jeanine Luzalone, Gregory, FNP

## 2015-11-23 NOTE — Assessment & Plan Note (Signed)
Symptoms of bipolar appear with improved control with increased Depakote and current Lexapro. Denies adverse side effects. Notes mood is improved. Obtain Depakote level. Continue current dosage of Depakote and Lexapro. Follow-up pending Depakote levels.

## 2015-11-23 NOTE — Patient Instructions (Signed)
Thank you for choosing [facility].  Summary/Instructions:  Please continue to take your medications as prescribed.   Your prescription(s) have been submitted to your pharmacy or been printed and provided for you. Please take as directed and contact our office if you believe you are having problem(s) with the medication(s) or have any questions.  If your symptoms worsen or fail to improve, please contact our office for further instruction, or in case of emergency go directly to the emergency room at the closest medical facility.     

## 2015-11-23 NOTE — Assessment & Plan Note (Signed)
Concentration is adequately controlled with some room for improvements with current dosage of Vyvanse. Increase Vyvanse. No adverse side effects noted. Sleeping and eating well with no cardiac symptoms. [place] controlled substance database reviewed with no irregularities. Will be due for urine drug screen in the near future.

## 2015-11-24 ENCOUNTER — Encounter: Payer: Self-pay | Admitting: Family

## 2015-11-24 LAB — VALPROIC ACID LEVEL: Valproic Acid Lvl: 43.7 ug/mL — ABNORMAL LOW (ref 50.0–100.0)

## 2015-12-13 ENCOUNTER — Encounter: Payer: Self-pay | Admitting: Family

## 2015-12-13 DIAGNOSIS — F902 Attention-deficit hyperactivity disorder, combined type: Secondary | ICD-10-CM

## 2015-12-14 MED ORDER — LISDEXAMFETAMINE DIMESYLATE 70 MG PO CAPS: 70.0000 mg | ORAL_CAPSULE | Freq: Every day | ORAL | Status: DC

## 2015-12-14 MED ORDER — DIVALPROEX SODIUM ER 500 MG PO TB24: 1500.0000 mg | ORAL_TABLET | Freq: Every day | ORAL | Status: DC

## 2015-12-26 ENCOUNTER — Encounter: Payer: Self-pay | Admitting: Family

## 2015-12-26 ENCOUNTER — Other Ambulatory Visit: Payer: Self-pay | Admitting: Internal Medicine

## 2015-12-26 ENCOUNTER — Telehealth: Payer: Self-pay

## 2015-12-26 ENCOUNTER — Ambulatory Visit (INDEPENDENT_AMBULATORY_CARE_PROVIDER_SITE_OTHER): Payer: BLUE CROSS/BLUE SHIELD | Admitting: Family

## 2015-12-26 VITALS — BP 132/88 | HR 72 | Temp 98.1°F | Resp 16 | Ht 67.0 in | Wt 229.0 lb

## 2015-12-26 DIAGNOSIS — F902 Attention-deficit hyperactivity disorder, combined type: Secondary | ICD-10-CM

## 2015-12-26 MED ORDER — LISDEXAMFETAMINE DIMESYLATE 70 MG PO CAPS: 70.0000 mg | ORAL_CAPSULE | Freq: Every day | ORAL | Status: DC

## 2015-12-26 NOTE — Progress Notes (Signed)
Pre visit review using our clinic review tool, if applicable. No additional management support is needed unless otherwise documented below in the visit note. 

## 2015-12-26 NOTE — Assessment & Plan Note (Signed)
ADHD currently maintained on Vyvanse with no adverse side effects. Sleeping and eating well with no cardiac symptoms. Attention is well maintained. Continue current dosage of Vyvanse. [place] controlled substance database reviewed with no irregularities. Patient is requesting a 90 day supply of [facility]. Will check with collaborating physician.

## 2015-12-26 NOTE — Progress Notes (Signed)
Subjective:    Patient ID: Jacob [name], male    DOB: 07-25-1985, 30 y.o.   MRN: [id number]  Chief Complaint  Patient presents with  . Follow-up    [facility] to [facility] over refills of vyvanse electronically    HPI:  Jacob [name] is a 30 y.o. male who  has a past medical history of Bipolar disorder (HCC); Allergy; and ADHD (attention deficit hyperactivity disorder) evaluation. and presents today for a follow up office visit.   1.) ADHD - Currently maintained on Vyvanse. Reports taking medication as prescribed and denies adverse side effects. Symptoms are generally well maintained with the current dosage. Sleeping and eating well with no significant weight changes. No chest pain, shortness of breath of heart palpitations.    No Known Allergies   Current Outpatient Prescriptions on File Prior to Visit  Medication Sig Dispense Refill  . albuterol (PROVENTIL HFA;VENTOLIN HFA) 108 (90 Base) MCG/ACT inhaler Inhale 2 puffs into the lungs every 6 (six) hours as needed for wheezing or shortness of breath. 1 Inhaler 2  . cetirizine (ZYRTEC) 10 MG tablet Take 10 mg by mouth daily.    . divalproex (DEPAKOTE ER) 500 MG 24 hr tablet Take 3 tablets (1,500 mg total) by mouth daily. 90 tablet 0  . escitalopram (LEXAPRO) 10 MG tablet Take 1 tablet (10 mg total) by mouth daily. 90 tablet 1  . ibuprofen (ADVIL,MOTRIN) 800 MG tablet Take 1 tablet (800 mg total) by mouth every 8 (eight) hours as needed. 90 tablet 0  . lisdexamfetamine (VYVANSE) 70 MG capsule Take 1 capsule (70 mg total) by mouth daily. 30 capsule 0   No current facility-administered medications on file prior to visit.    Review of Systems  Constitutional: Negative for diaphoresis, appetite change and unexpected weight change.  Respiratory: Negative for chest tightness and shortness of breath.   Cardiovascular: Negative for chest pain, palpitations and leg swelling.  Neurological: Negative for headaches.    Psychiatric/Behavioral: Negative for sleep disturbance and decreased concentration. The patient is not nervous/anxious.       Objective:    BP 132/88 mmHg  Pulse 72  Temp(Src) 98.1 F (36.7 C) (Oral)  Resp 16  Ht 5\' 7"  (1.702 m)  Wt 229 lb (103.874 kg)  BMI 35.86 kg/m2  SpO2 98% Nursing note and vital signs reviewed.  Physical Exam  Constitutional: He is oriented to person, place, and time. He appears well-developed and well-nourished. No distress.  Cardiovascular: Normal rate, regular rhythm, normal heart sounds and intact distal pulses.   Pulmonary/Chest: Effort normal and breath sounds normal.  Neurological: He is alert and oriented to person, place, and time.  Skin: Skin is warm and dry.  Psychiatric: He has a normal mood and affect. His behavior is normal. Judgment and thought content normal.       Assessment & Plan:   Problem List Items Addressed This Visit      Other   ADHD (attention deficit hyperactivity disorder) - Primary    ADHD currently maintained on Vyvanse with no adverse side effects. Sleeping and eating well with no cardiac symptoms. Attention is well maintained. Continue current dosage of Vyvanse. [place] controlled substance database reviewed with no irregularities. Patient is requesting a 90 day supply of [facility]. Will check with collaborating physician.          I am having Mr. Burrous maintain his cetirizine, albuterol, ibuprofen, escitalopram, divalproex, and lisdexamfetamine.   Follow-up: Return  in about 5 months (around 05/27/2016).  Jeanine Luz, FNP

## 2015-12-26 NOTE — Patient Instructions (Signed)
Thank you for choosing [facility].  Summary/Instructions:  Please continue to take your medication as prescribed.   We will work on 90-day supply of medication [facility].  Your prescription(s) have been submitted to your pharmacy or been printed and provided for you. Please take as directed and contact our office if you believe you are having problem(s) with the medication(s) or have any questions.  If your symptoms worsen or fail to improve, please contact our office for further instruction, or in case of emergency go directly to the emergency room at the closest medical facility.

## 2015-12-26 NOTE — Telephone Encounter (Signed)
Called pt to let him know that his rx for vyvanse is ready for pick up. LVM letting him know.

## 2016-01-30 ENCOUNTER — Ambulatory Visit (INDEPENDENT_AMBULATORY_CARE_PROVIDER_SITE_OTHER): Payer: Worker's Compensation | Admitting: Physician Assistant

## 2016-01-30 ENCOUNTER — Ambulatory Visit: Payer: Worker's Compensation

## 2016-01-30 VITALS — BP 130/80 | HR 78 | Temp 98.7°F | Resp 17 | Ht 68.0 in | Wt 217.0 lb

## 2016-01-30 DIAGNOSIS — S5001XA Contusion of right elbow, initial encounter: Secondary | ICD-10-CM | POA: Diagnosis not present

## 2016-01-30 DIAGNOSIS — M25552 Pain in left hip: Secondary | ICD-10-CM | POA: Diagnosis not present

## 2016-01-30 DIAGNOSIS — M25532 Pain in left wrist: Secondary | ICD-10-CM

## 2016-01-30 DIAGNOSIS — M25521 Pain in right elbow: Secondary | ICD-10-CM | POA: Diagnosis not present

## 2016-01-30 DIAGNOSIS — W11XXXA Fall on and from ladder, initial encounter: Secondary | ICD-10-CM

## 2016-01-30 DIAGNOSIS — S5002XA Contusion of left elbow, initial encounter: Secondary | ICD-10-CM | POA: Diagnosis not present

## 2016-01-30 MED ORDER — MELOXICAM 7.5 MG PO TABS: 7.5000 mg | ORAL_TABLET | Freq: Every day | ORAL | 0 refills | Status: DC

## 2016-01-30 MED ORDER — CYCLOBENZAPRINE HCL 5 MG PO TABS: 5.0000 mg | ORAL_TABLET | Freq: Three times a day (TID) | ORAL | 1 refills | Status: DC | PRN

## 2016-01-30 MED ORDER — MELOXICAM 7.5 MG PO TABS
7.5000 mg | ORAL_TABLET | Freq: Every day | ORAL | 0 refills | Status: DC
Start: 2016-01-30 — End: 2016-01-30

## 2016-01-30 NOTE — Progress Notes (Signed)
Jacob [name] [birth year] 30 y.o.   Chief Complaint  Patient presents with  . Hip Pain    left post fall   . Elbow Pain    both post fall  . Wrist Pain    post fall at work      Date of Injury: 11:30am  History of Present Illness:  Presents for evaluation of work-related complaint.  -- Hip, elbow, wrist pain after falling off a ladder within the last few hours.   Went to step 2-3 up.  He does not recall what he direct landed off. Wrist pain no swelling.  Fingers with paresthesia.   Hip pain worsening at the left side.  Walking  Aggravates the pan.  No incontinence.  No dizziness.   Elbow pain of left side.  No swelling.  No numbness or tingling.  Pain radiating up and down the forearm.     ROS ROS otherwise unremarkable unless listed above.   Current medications and allergies reviewed and updated. Past medical history, family history, social history have been reviewed and updated.   Physical Exam  Constitutional: He is oriented to person, place, and time and well-developed, well-nourished, and in no distress. No distress.  HENT:  Head: Normocephalic and atraumatic.  Eyes: Pupils are equal, round, and reactive to light.  Neck: Normal range of motion.  Cardiovascular: Normal rate, regular rhythm and S1 normal.   Pulmonary/Chest: Effort normal. No accessory muscle usage. No respiratory distress. He has no decreased breath sounds. He has no wheezes. He has no rhonchi.  Musculoskeletal:       Right elbow: Tenderness found. Medial epicondyle (ABRASION) tenderness noted.       Left elbow: He exhibits normal range of motion and no swelling. Tenderness found. Lateral epicondyle tenderness noted. No medial epicondyle and no olecranon process tenderness noted.       Left wrist: He exhibits decreased range of motion. He exhibits no tenderness and no swelling.  Neurological: He is alert and oriented to person, place, and time.  Skin: Skin is warm and dry. He is not diaphoretic.      Dg Elbow Complete Left (3+view)  Result Date: 01/30/2016 CLINICAL DATA:  Larey Seat today with left arm pain EXAM: LEFT ELBOW - COMPLETE 3+ VIEW COMPARISON:  None. FINDINGS: The radius and ulna are normally positioned in the elbow joint space appears normal. No elbow joint effusion is noted. No fracture is seen. IMPRESSION: Negative. Electronically Signed   By: Dwyane Dee M.D.   On: 01/30/2016 16:13  Dg Elbow Complete Right  Result Date: 01/30/2016 CLINICAL DATA:  Larey Seat today with right elbow pain EXAM: RIGHT ELBOW - COMPLETE 3+ VIEW COMPARISON:  None. FINDINGS: Right radius and ulna are intact and normally aligned. The right elbow joint space appears normal. No joint effusion is seen. No fracture is noted. IMPRESSION: Negative. Electronically Signed   By: Dwyane Dee M.D.   On: 01/30/2016 16:13  Dg Wrist Complete Left  Result Date: 01/30/2016 CLINICAL DATA:  Larey Seat today with left wrist pain EXAM: LEFT WRIST - COMPLETE 3+ VIEW COMPARISON:  None. FINDINGS: The left radiocarpal joint space appears normal. The carpal bones are in normal position. Alignment is normal. No fracture is seen. IMPRESSION: Negative. Electronically Signed   By: Dwyane Dee M.D.   On: 01/30/2016 16:12     Assessment and Plan: Patient given mobic with precautions, and muscle relaxant.   Advised to ice these areas.   Restrictions placed on letter (please refer).  Fall  from ladder, initial encounter - Plan: DG Wrist Complete Left, DG ELBOW COMPLETE LEFT (3+VIEW), DG HIP UNILAT W OR W/O PELVIS 2-3 VIEWS LEFT, DG FEMUR MIN 2 VIEWS LEFT, DG Elbow Complete Right, cyclobenzaprine (FLEXERIL) 5 MG tablet, meloxicam (MOBIC) 7.5 MG tablet, Care order/instruction:, DISCONTINUED: meloxicam (MOBIC) 7.5 MG tablet  Right elbow pain - Plan: DG Wrist Complete Left, DG ELBOW COMPLETE LEFT (3+VIEW), DG HIP UNILAT W OR W/O PELVIS 2-3 VIEWS LEFT, DG FEMUR MIN 2 VIEWS LEFT, DG Elbow Complete Right, cyclobenzaprine (FLEXERIL) 5 MG tablet,  meloxicam (MOBIC) 7.5 MG tablet, Care order/instruction:  Left hip pain - Plan: DG HIP UNILAT W OR W/O PELVIS 2-3 VIEWS LEFT, DG FEMUR MIN 2 VIEWS LEFT, cyclobenzaprine (FLEXERIL) 5 MG tablet, meloxicam (MOBIC) 7.5 MG tablet, Care order/instruction:  Left elbow contusion, initial encounter - Plan: DG ELBOW COMPLETE LEFT (3+VIEW), cyclobenzaprine (FLEXERIL) 5 MG tablet, meloxicam (MOBIC) 7.5 MG tablet, Care order/instruction:  Left wrist pain - Plan: DG Wrist Complete Left, cyclobenzaprine (FLEXERIL) 5 MG tablet, meloxicam (MOBIC) 7.5 MG tablet, Care order/instruction:  Contusion of right elbow, initial encounter - Plan: DG Elbow Complete Right, cyclobenzaprine (FLEXERIL) 5 MG tablet, meloxicam (MOBIC) 7.5 MG tablet, Care order/instruction:   Trena Platt, PA-C [facility] 7/25/20174:18 PM

## 2016-01-30 NOTE — Patient Instructions (Addendum)
     IF you received an x-ray today, you will receive an invoice from [facility]. Please contact [facility] at [phone] with questions or concerns regarding your invoice.   IF you received labwork today, you will receive an invoice from [employer]. Please contact [facility] at [phone] with questions or concerns regarding your invoice.   Our billing staff will not be able to assist you with questions regarding bills from these companies.  You will be contacted with the lab results as soon as they are available. The fastest way to get your results is to activate your My Chart account. Instructions are located on the last page of this paperwork. If you have not heard from [place] regarding the results in 2 weeks, please contact this office.     Please ice these areas three times per day.  Do not take ibuprofen or naproxen.  You can take a tylenol.  You will return on Friday.   Do light stretches three times per day

## 2016-02-02 ENCOUNTER — Ambulatory Visit (INDEPENDENT_AMBULATORY_CARE_PROVIDER_SITE_OTHER): Payer: Worker's Compensation | Admitting: Family Medicine

## 2016-02-02 VITALS — BP 130/72 | HR 122 | Temp 98.5°F | Resp 20 | Ht 68.0 in | Wt 221.0 lb

## 2016-02-02 DIAGNOSIS — M79601 Pain in right arm: Secondary | ICD-10-CM | POA: Diagnosis not present

## 2016-02-02 DIAGNOSIS — M79605 Pain in left leg: Secondary | ICD-10-CM | POA: Diagnosis not present

## 2016-02-02 DIAGNOSIS — W11XXXA Fall on and from ladder, initial encounter: Secondary | ICD-10-CM | POA: Diagnosis not present

## 2016-02-02 DIAGNOSIS — M79602 Pain in left arm: Secondary | ICD-10-CM | POA: Diagnosis not present

## 2016-02-02 DIAGNOSIS — M79604 Pain in right leg: Secondary | ICD-10-CM | POA: Diagnosis not present

## 2016-02-02 DIAGNOSIS — T148XXA Other injury of unspecified body region, initial encounter: Secondary | ICD-10-CM

## 2016-02-02 NOTE — Progress Notes (Signed)
Jacob [name] is a 30 y.o. male who presents to [facility] today for follow-up Worker's Compensation injury. Patient fell from a ladder at work on July 25. He was seen in the clinic where he had extensive workup showing no fractures. He notes the soreness has significantly improved. He notes mild elbow pain bilaterally and moderate lateral leg pain. He's been back to work with limited work duties including no climbing or lifting beyond 5 pounds. He feels fine otherwise. No fevers or chills.  Past Medical History:  Diagnosis Date  . ADHD (attention deficit hyperactivity disorder) evaluation   . Allergy   . Bipolar disorder (HCC)    History reviewed. No pertinent surgical history. Social History  Substance Use Topics  . Smoking status: Former Smoker    Packs/day: 0.00    Years: 9.00    Types: Cigarettes  . Smokeless tobacco: Former Neurosurgeon  . Alcohol use Yes     Comment: Rare   ROS as above Medications: Current Outpatient Prescriptions  Medication Sig Dispense Refill  . albuterol (PROVENTIL HFA;VENTOLIN HFA) 108 (90 Base) MCG/ACT inhaler Inhale 2 puffs into the lungs every 6 (six) hours as needed for wheezing or shortness of breath. 1 Inhaler 2  . cetirizine (ZYRTEC) 10 MG tablet Take 10 mg by mouth daily.    . cyclobenzaprine (FLEXERIL) 5 MG tablet Take 1-2 tablets (5-10 mg total) by mouth 3 (three) times daily as needed for muscle spasms. 30 tablet 1  . divalproex (DEPAKOTE ER) 500 MG 24 hr tablet Take 3 tablets (1,500 mg total) by mouth daily. 90 tablet 0  . escitalopram (LEXAPRO) 10 MG tablet Take 1 tablet (10 mg total) by mouth daily. 90 tablet 1  . ibuprofen (ADVIL,MOTRIN) 800 MG tablet Take 1 tablet (800 mg total) by mouth every 8 (eight) hours as needed. 90 tablet 0  . lisdexamfetamine (VYVANSE) 70 MG capsule Take 1 capsule (70 mg total) by mouth daily. 90 capsule 0  . meloxicam (MOBIC) 7.5 MG tablet Take 1-2 tablets (7.5-15 mg total) by mouth daily. 30 tablet 0   No current  facility-administered medications for this visit.    No Known Allergies   Exam:  BP 130/72 (BP Location: Left Arm, Patient Position: Sitting, Cuff Size: Normal)   Pulse (!) 122   Temp 98.5 F (36.9 C) (Oral)   Resp 20   Ht 5\' 8"  (1.727 m)   Wt 221 lb (100.2 kg)   SpO2 99%   BMI 33.60 kg/m  Gen: Well NAD MSK: Elbows are normal appearing and nontender bilaterally with normal motion and strength. Left lateral hip and leg is normal-appearing but tender to palpation. Normal gait and leg strength.  Dg Elbow Complete Left (3+view)  Result Date: 01/30/2016 CLINICAL DATA:  Larey Seat today with left arm pain EXAM: LEFT ELBOW - COMPLETE 3+ VIEW COMPARISON:  None. FINDINGS: The radius and ulna are normally positioned in the elbow joint space appears normal. No elbow joint effusion is noted. No fracture is seen. IMPRESSION: Negative. Electronically Signed   By: Dwyane Dee M.D.   On: 01/30/2016 16:13  Dg Elbow Complete Right  Result Date: 01/30/2016 CLINICAL DATA:  Larey Seat today with right elbow pain EXAM: RIGHT ELBOW - COMPLETE 3+ VIEW COMPARISON:  None. FINDINGS: Right radius and ulna are intact and normally aligned. The right elbow joint space appears normal. No joint effusion is seen. No fracture is noted. IMPRESSION: Negative. Electronically Signed   By: Dwyane Dee M.D.   On: 01/30/2016  16:13  Dg Wrist Complete Left  Result Date: 01/30/2016 CLINICAL DATA:  Larey Seat today with left wrist pain EXAM: LEFT WRIST - COMPLETE 3+ VIEW COMPARISON:  None. FINDINGS: The left radiocarpal joint space appears normal. The carpal bones are in normal position. Alignment is normal. No fracture is seen. IMPRESSION: Negative. Electronically Signed   By: Dwyane Dee M.D.   On: 01/30/2016 16:12  Dg Hip Unilat W Or W/o Pelvis 2-3 Views Left  Result Date: 01/30/2016 CLINICAL DATA:  Larey Seat today with pain on the left EXAM: DG HIP (WITH OR WITHOUT PELVIS) 2-3V LEFT COMPARISON:  None. FINDINGS: Both hip joints appear normal for  age. No fracture is seen. The pelvic rami are intact. The SI joints are corticated. IMPRESSION: Negative. Electronically Signed   By: Dwyane Dee M.D.   On: 01/30/2016 16:16  Dg Femur Min 2 Views Left  Result Date: 01/30/2016 CLINICAL DATA:  Larey Seat hitting the left side with left hip pain EXAM: LEFT FEMUR 2 VIEWS COMPARISON:  None. FINDINGS: The left femur is intact. The left knee joint is unremarkable. No fracture is seen. No joint effusion is noted. IMPRESSION: Negative. Electronically Signed   By: Dwyane Dee M.D.   On: 01/30/2016 16:25    No results found for this or any previous visit (from the past 24 hour(s)). No results found.  Assessment and Plan: 30 y.o. male with contusions arising from workplace injury. Improving. Continue NSAIDs as needed. Return to work full duty on Monday. Return to clinic in 1 week for recheck. Call or return sooner if not able to return to work on Monday.  Discussed warning signs or symptoms. Please see discharge instructions. Patient expresses understanding.

## 2016-02-02 NOTE — Patient Instructions (Addendum)
  Thank you for coming in today. Please return in 1 week.  Return to full duty Monday.  Return sooner if needed.      IF you received an x-ray today, you will receive an invoice from [facility]. Please contact [facility] at [phone] with questions or concerns regarding your invoice.   IF you received labwork today, you will receive an invoice from [employer]. Please contact [facility] at [phone] with questions or concerns regarding your invoice.   Our billing staff will not be able to assist you with questions regarding bills from these companies.  You will be contacted with the lab results as soon as they are available. The fastest way to get your results is to activate your My Chart account. Instructions are located on the last page of this paperwork. If you have not heard from [place] regarding the results in 2 weeks, please contact this office.

## 2016-02-22 ENCOUNTER — Encounter: Payer: Self-pay | Admitting: Family

## 2016-03-03 ENCOUNTER — Other Ambulatory Visit: Payer: Self-pay | Admitting: Family

## 2016-03-03 DIAGNOSIS — F3162 Bipolar disorder, current episode mixed, moderate: Secondary | ICD-10-CM

## 2016-03-03 MED ORDER — DIVALPROEX SODIUM ER 500 MG PO TB24: 1500.0000 mg | ORAL_TABLET | Freq: Every day | ORAL | 0 refills | Status: DC

## 2016-03-25 ENCOUNTER — Encounter (HOSPITAL_COMMUNITY): Payer: Self-pay

## 2016-04-09 ENCOUNTER — Encounter: Payer: Self-pay | Admitting: Internal Medicine

## 2016-04-09 ENCOUNTER — Ambulatory Visit (INDEPENDENT_AMBULATORY_CARE_PROVIDER_SITE_OTHER)
Admission: RE | Admit: 2016-04-09 | Discharge: 2016-04-09 | Disposition: A | Discharge status: Home / Self Care | Payer: BLUE CROSS/BLUE SHIELD | Source: Ambulatory Visit | Attending: Internal Medicine | Admitting: Internal Medicine

## 2016-04-09 ENCOUNTER — Ambulatory Visit (INDEPENDENT_AMBULATORY_CARE_PROVIDER_SITE_OTHER): Payer: BLUE CROSS/BLUE SHIELD | Admitting: Internal Medicine

## 2016-04-09 VITALS — BP 130/78 | HR 103 | Temp 99.6°F | Wt 207.0 lb

## 2016-04-09 DIAGNOSIS — J45909 Unspecified asthma, uncomplicated: Secondary | ICD-10-CM | POA: Insufficient documentation

## 2016-04-09 DIAGNOSIS — J309 Allergic rhinitis, unspecified: Secondary | ICD-10-CM | POA: Insufficient documentation

## 2016-04-09 DIAGNOSIS — R062 Wheezing: Secondary | ICD-10-CM | POA: Diagnosis not present

## 2016-04-09 DIAGNOSIS — J45901 Unspecified asthma with (acute) exacerbation: Secondary | ICD-10-CM | POA: Diagnosis not present

## 2016-04-09 MED ORDER — BUDESONIDE-FORMOTEROL FUMARATE 160-4.5 MCG/ACT IN AERO: 2.0000 | INHALATION_SPRAY | Freq: Two times a day (BID) | RESPIRATORY_TRACT | 11 refills | Status: DC

## 2016-04-09 MED ORDER — METHYLPREDNISOLONE ACETATE 80 MG/ML IJ SUSP
80.0000 mg | Freq: Once | INTRAMUSCULAR | Status: AC
  Administered 2016-04-09: 80 mg via INTRAMUSCULAR

## 2016-04-09 MED ORDER — PROMETHAZINE-CODEINE 6.25-10 MG/5ML PO SYRP: 5.0000 mL | ORAL_SOLUTION | ORAL | 0 refills | Status: DC | PRN

## 2016-04-09 NOTE — Assessment & Plan Note (Signed)
Zyrtec qd 

## 2016-04-09 NOTE — Progress Notes (Signed)
Pre visit review using our clinic review tool, if applicable. No additional management support is needed unless otherwise documented below in the visit note. 

## 2016-04-09 NOTE — Addendum Note (Signed)
Addended by: Merrilyn PumaSIMMONS, Dorianne Perret N on: 04/09/2016 04:47 PM   Modules accepted: Orders

## 2016-04-09 NOTE — Progress Notes (Signed)
Subjective:  Patient ID: Jacob CharityGraham L Metts, male    DOB: 01-Dec-1985  Age: 30 y.o. MRN: [id number]  CC: Cough and Wheezing   HPI Jacob [name] presents for a URI sx's x 1 week - he felt better; he is worse now; can't breath at night  Outpatient Medications Prior to Visit  Medication Sig Dispense Refill  . albuterol (PROVENTIL HFA;VENTOLIN HFA) 108 (90 Base) MCG/ACT inhaler Inhale 2 puffs into the lungs every 6 (six) hours as needed for wheezing or shortness of breath. 1 Inhaler 2  . cetirizine (ZYRTEC) 10 MG tablet Take 10 mg by mouth daily.    . cyclobenzaprine (FLEXERIL) 5 MG tablet Take 1-2 tablets (5-10 mg total) by mouth 3 (three) times daily as needed for muscle spasms. 30 tablet 1  . divalproex (DEPAKOTE ER) 500 MG 24 hr tablet Take 3 tablets (1,500 mg total) by mouth daily. 270 tablet 0  . escitalopram (LEXAPRO) 10 MG tablet Take 1 tablet (10 mg total) by mouth daily. 90 tablet 1  . ibuprofen (ADVIL,MOTRIN) 800 MG tablet Take 1 tablet (800 mg total) by mouth every 8 (eight) hours as needed. 90 tablet 0  . lisdexamfetamine (VYVANSE) 70 MG capsule Take 1 capsule (70 mg total) by mouth daily. 90 capsule 0  . meloxicam (MOBIC) 7.5 MG tablet Take 1-2 tablets (7.5-15 mg total) by mouth daily. 30 tablet 0   No facility-administered medications prior to visit.     ROS Review of Systems  Constitutional: Positive for fatigue. Negative for appetite change and unexpected weight change.  HENT: Positive for rhinorrhea. Negative for congestion, nosebleeds, sneezing, sore throat and trouble swallowing.   Eyes: Negative for itching and visual disturbance.  Respiratory: Positive for shortness of breath and wheezing. Negative for cough.   Cardiovascular: Negative for chest pain, palpitations and leg swelling.  Gastrointestinal: Negative for abdominal distention, blood in stool, diarrhea and nausea.  Genitourinary: Negative for frequency and hematuria.  Musculoskeletal: Negative for back pain,  gait problem, joint swelling and neck pain.  Skin: Negative for rash.  Neurological: Negative for dizziness, tremors, speech difficulty and weakness.  Psychiatric/Behavioral: Negative for agitation, dysphoric mood and sleep disturbance. The patient is not nervous/anxious.     Objective:  BP 130/78   Pulse (!) 103   Temp 99.6 F (37.6 C) (Oral)   Wt 207 lb (93.9 kg)   SpO2 96%   BMI 31.47 kg/m   BP Readings from Last 3 Encounters:  04/09/16 130/78  02/02/16 130/72  01/30/16 130/80    Wt Readings from Last 3 Encounters:  04/09/16 207 lb (93.9 kg)  02/02/16 221 lb (100.2 kg)  01/30/16 217 lb (98.4 kg)    Physical Exam  Constitutional: He is oriented to person, place, and time. He appears well-developed. No distress.  NAD  HENT:  Mouth/Throat: Oropharynx is clear and moist.  Eyes: Conjunctivae are normal. Pupils are equal, round, and reactive to light.  Neck: Normal range of motion. No JVD present. No thyromegaly present.  Cardiovascular: Normal rate, regular rhythm, normal heart sounds and intact distal pulses.  Exam reveals no gallop and no friction rub.   No murmur heard. Pulmonary/Chest: Effort normal. No respiratory distress. He has wheezes. He has no rales. He exhibits no tenderness.  Abdominal: Soft. Bowel sounds are normal. He exhibits no distension and no mass. There is no tenderness. There is no rebound and no guarding.  Musculoskeletal: Normal range of motion. He exhibits no edema or tenderness.  Lymphadenopathy:  He has no cervical adenopathy.  Neurological: He is alert and oriented to person, place, and time. He has normal reflexes. No cranial nerve deficit. He exhibits normal muscle tone. He displays a negative Romberg sign. Coordination and gait normal.  Skin: Skin is warm and dry. No rash noted.  Psychiatric: He has a normal mood and affect. His behavior is normal. Judgment and thought content normal.  B wheezes  Lab Results  Component Value Date   WBC  7.9 07/07/2014   HGB 15.3 07/07/2014   HCT 45.2 07/07/2014   PLT 260.0 07/07/2014   GLUCOSE 104 (H) 10/26/2014   ALT 25 10/26/2014   AST 20 10/26/2014   NA 139 10/26/2014   K 3.9 10/26/2014   CL 105 10/26/2014   CREATININE 1.16 10/26/2014   BUN 13 10/26/2014   CO2 29 10/26/2014   TSH 1.16 07/07/2014    Dg Abd 2 Views  Result Date: 09/10/2001 FINDINGS CLINICAL DATA:  RT FLANK PAIN. TWO VIEWS OF THE ABDOMEN SUPINE AND UPRIGHT FILMS SHOW NORMAL GAS PATTERN WITH A MODERATE AMOUNT OF FECAL MATERIAL IN THE COLON.  THE KIDNEYS ARE NOT WELL DEFINED.  NO URINARY TRACT CALCIFICATIONS CAN BE APPRECIATED. THERE ARE NO AIR FLUID LEVELS AND NO FREE AIR.  BONES UNREMARKABLE. IMPRESSION NORMAL.   Assessment & Plan:   There are no diagnoses linked to this encounter. I am having Mr. Sheller maintain his cetirizine, albuterol, ibuprofen, escitalopram, lisdexamfetamine, cyclobenzaprine, meloxicam, and divalproex.  No orders of the defined types were placed in this encounter.    Follow-up: No Follow-up on file.  Sonda Primes, MD

## 2016-04-09 NOTE — Assessment & Plan Note (Signed)
Symbicort added Proair prn Depomedrol 80 mg IM CXR

## 2016-04-10 ENCOUNTER — Telehealth: Payer: Self-pay | Admitting: Internal Medicine

## 2016-04-10 DIAGNOSIS — R9389 Abnormal findings on diagnostic imaging of other specified body structures: Secondary | ICD-10-CM

## 2016-04-10 NOTE — Telephone Encounter (Signed)
CXR with ?finding in the mediastinum. Cont current Rx I'll order chest CT Thx

## 2016-04-10 NOTE — Telephone Encounter (Signed)
Patient is calling to check up on the imaging from yesterday. No interpretation entered. Patient is concerned because he has a toddler. Please advise asap .

## 2016-04-10 NOTE — Telephone Encounter (Signed)
Dr. Posey ReaPlotnikov reviewed 04/09/16 CXR. He states the xray shows possible artifact, most likely a false finding. However, to be sure a chest CT is recommended. Dr. Posey ReaPlotnikov to place order. Pt informed of this and to be expecting another call to schedule chest CT.

## 2016-04-18 ENCOUNTER — Ambulatory Visit (INDEPENDENT_AMBULATORY_CARE_PROVIDER_SITE_OTHER)
Admission: RE | Admit: 2016-04-18 | Discharge: 2016-04-18 | Disposition: A | Discharge status: Home / Self Care | Payer: BLUE CROSS/BLUE SHIELD | Source: Ambulatory Visit | Attending: Internal Medicine | Admitting: Internal Medicine

## 2016-04-18 DIAGNOSIS — R9389 Abnormal findings on diagnostic imaging of other specified body structures: Secondary | ICD-10-CM

## 2016-04-18 DIAGNOSIS — R938 Abnormal findings on diagnostic imaging of other specified body structures: Secondary | ICD-10-CM | POA: Diagnosis not present

## 2016-04-18 MED ORDER — IOPAMIDOL (ISOVUE-300) INJECTION 61%
80.0000 mL | Freq: Once | INTRAVENOUS | Status: AC | PRN
  Administered 2016-04-18: 80 mL via INTRAVENOUS
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
## 2016-04-29 ENCOUNTER — Encounter (HOSPITAL_COMMUNITY): Payer: Self-pay | Admitting: Psychiatry

## 2016-04-29 ENCOUNTER — Ambulatory Visit (INDEPENDENT_AMBULATORY_CARE_PROVIDER_SITE_OTHER): Payer: BLUE CROSS/BLUE SHIELD | Admitting: Psychiatry

## 2016-04-29 VITALS — BP 136/90 | HR 90 | Ht 67.0 in | Wt 206.2 lb

## 2016-04-29 DIAGNOSIS — Z808 Family history of malignant neoplasm of other organs or systems: Secondary | ICD-10-CM

## 2016-04-29 DIAGNOSIS — F331 Major depressive disorder, recurrent, moderate: Secondary | ICD-10-CM | POA: Diagnosis not present

## 2016-04-29 DIAGNOSIS — F902 Attention-deficit hyperactivity disorder, combined type: Secondary | ICD-10-CM | POA: Diagnosis not present

## 2016-04-29 DIAGNOSIS — F17210 Nicotine dependence, cigarettes, uncomplicated: Secondary | ICD-10-CM

## 2016-04-29 MED ORDER — LISDEXAMFETAMINE DIMESYLATE 70 MG PO CAPS: 70.0000 mg | ORAL_CAPSULE | Freq: Every day | ORAL | 0 refills | Status: DC

## 2016-04-29 NOTE — Patient Instructions (Addendum)
1. Increase Depakote 2000 mg daily 2. Continue Lexapro 10 mg daily 3. Continue Vyvanse 70 mg daily 4. Take blood test (Depakote, liver function) in 2 weeks  5. Will make referral to a therapist 6. Return to clinic in one month

## 2016-04-30 ENCOUNTER — Ambulatory Visit (INDEPENDENT_AMBULATORY_CARE_PROVIDER_SITE_OTHER): Payer: BLUE CROSS/BLUE SHIELD | Admitting: Family

## 2016-04-30 ENCOUNTER — Encounter: Payer: Self-pay | Admitting: Family

## 2016-04-30 VITALS — BP 118/80 | HR 98 | Temp 98.1°F | Resp 16 | Ht 67.0 in | Wt 207.0 lb

## 2016-04-30 DIAGNOSIS — Z23 Encounter for immunization: Secondary | ICD-10-CM | POA: Diagnosis not present

## 2016-04-30 DIAGNOSIS — F39 Unspecified mood [affective] disorder: Secondary | ICD-10-CM | POA: Insufficient documentation

## 2016-04-30 NOTE — Progress Notes (Signed)
Subjective:    Patient ID: Jacob [name], male    DOB: [birth year], 30 y.o.   MRN: [id number]  Chief Complaint  Patient presents with  . Follow-up    went to behavior health they stated that he did not have the dxs     HPI:  Jacob CharityGraham L Colombo is a 30 y.o. male who  has a past medical history of ADHD (attention deficit hyperactivity disorder) evaluation; Allergy; and Bipolar disorder (HCC). and presents today for a follow up office visit.   1.) Unspecified mood disorder - Currently maintained on Depakote and Lexapro. Reports taking the medication as prescribed and denies adverse side effects. He has just started the new dose of Depakote. He does continue to have occasional anger and was recently evaluated by psychiatry and had his Depakote increased. He will also be working with psychology.    No Known Allergies    Outpatient Medications Prior to Visit  Medication Sig Dispense Refill  . albuterol (PROVENTIL HFA;VENTOLIN HFA) 108 (90 Base) MCG/ACT inhaler Inhale 2 puffs into the lungs every 6 (six) hours as needed for wheezing or shortness of breath. 1 Inhaler 2  . budesonide-formoterol (SYMBICORT) 160-4.5 MCG/ACT inhaler Inhale 2 puffs into the lungs 2 (two) times daily. 1 Inhaler 11  . cetirizine (ZYRTEC) 10 MG tablet Take 10 mg by mouth daily.    . cyclobenzaprine (FLEXERIL) 5 MG tablet Take 1-2 tablets (5-10 mg total) by mouth 3 (three) times daily as needed for muscle spasms. 30 tablet 1  . divalproex (DEPAKOTE ER) 500 MG 24 hr tablet Take 3 tablets (1,500 mg total) by mouth daily. 270 tablet 0  . escitalopram (LEXAPRO) 10 MG tablet Take 1 tablet (10 mg total) by mouth daily. 90 tablet 1  . ibuprofen (ADVIL,MOTRIN) 800 MG tablet Take 1 tablet (800 mg total) by mouth every 8 (eight) hours as needed. 90 tablet 0  . lisdexamfetamine (VYVANSE) 70 MG capsule Take 1 capsule (70 mg total) by mouth daily. 30 capsule 0  . meloxicam (MOBIC) 7.5 MG tablet Take 1-2 tablets (7.5-15 mg total) by  mouth daily. 30 tablet 0  . promethazine-codeine (PHENERGAN WITH CODEINE) 6.25-10 MG/5ML syrup Take 5 mLs by mouth every 4 (four) hours as needed. 300 mL 0   No facility-administered medications prior to visit.     Review of Systems  Constitutional: Negative for chills and fever.  Respiratory: Negative for chest tightness, shortness of breath and wheezing.   Psychiatric/Behavioral: Positive for agitation. Negative for dysphoric mood and suicidal ideas. The patient is nervous/anxious.       Objective:    BP 118/80 (BP Location: Left Arm, Patient Position: Sitting, Cuff Size: Normal)   Pulse 98   Temp 98.1 F (36.7 C) (Oral)   Resp 16   Ht 5\' 7"  (1.702 m)   Wt 207 lb (93.9 kg)   SpO2 100%   BMI 32.42 kg/m  Nursing note and vital signs reviewed.  Physical Exam  Constitutional: He is oriented to person, place, and time. He appears well-developed and well-nourished. No distress.  Cardiovascular: Normal rate, regular rhythm, normal heart sounds and intact distal pulses.   Pulmonary/Chest: Effort normal and breath sounds normal.  Neurological: He is alert and oriented to person, place, and time.  Skin: Skin is warm and dry.  Psychiatric: He has a normal mood and affect. His behavior is normal. Judgment and thought content normal.       Assessment & Plan:   Problem List Items Addressed  This Visit      Other   Mood disorder (HCC) - Primary    Per psychiatry there is concern for mood disorder versus Bipolar. Depakote was increased with potential consideration for increased Lexapro. Continue current dosage of Depakote and lexapro with changes per psychiatry.        Other Visit Diagnoses    Encounter for immunization       Relevant Orders   Flu Vaccine QUAD 36+ mos IM (Completed)   Need for diphtheria-tetanus-pertussis (Tdap) vaccine, adult/adolescent       Relevant Orders   Tdap vaccine greater than or equal to 7yo IM (Completed)       I have discontinued Mr. Lucchesi's  promethazine-codeine. I am also having him maintain his cetirizine, albuterol, ibuprofen, escitalopram, cyclobenzaprine, meloxicam, divalproex, budesonide-formoterol, and lisdexamfetamine.   Follow-up: Return if symptoms worsen or fail to improve.  Jeanine Luz, FNP

## 2016-04-30 NOTE — Patient Instructions (Addendum)
Thank you for choosing [facility].  SUMMARY AND INSTRUCTIONS:  Please continue to follow up with Dr. Vanetta ShawlHisada.  Your chest CT is fine. If you continue to have symptoms please let us know.   Medication:  Please continue to take your medication as prescribed.   Your prescription(s) have been submitted to your pharmacy or been printed and provided for you. Please take as directed and contact our office if you believe you are having problem(s) with the medication(s) or have any questions.  Follow up:  If your symptoms worsen or fail to improve, please contact our office for further instruction, or in case of emergency go directly to the emergency room at the closest medical facility.

## 2016-04-30 NOTE — Assessment & Plan Note (Signed)
Per psychiatry there is concern for mood disorder versus Bipolar. Depakote was increased with potential consideration for increased Lexapro. Continue current dosage of Depakote and lexapro with changes per psychiatry.
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
## 2016-05-29 LAB — COMPREHENSIVE METABOLIC PANEL
ALBUMIN: 4 g/dL (ref 3.6–5.1)
ALT: 41 U/L (ref 9–46)
AST: 33 U/L (ref 10–40)
Alkaline Phosphatase: 39 U/L — ABNORMAL LOW (ref 40–115)
BILIRUBIN TOTAL: 0.3 mg/dL (ref 0.2–1.2)
BUN: 15 mg/dL (ref 7–25)
CALCIUM: 9.5 mg/dL (ref 8.6–10.3)
CHLORIDE: 109 mmol/L (ref 98–110)
CO2: 25 mmol/L (ref 20–31)
Creat: 1.26 mg/dL (ref 0.60–1.35)
Glucose, Bld: 91 mg/dL (ref 65–99)
Potassium: 4.4 mmol/L (ref 3.5–5.3)
Sodium: 141 mmol/L (ref 135–146)
Total Protein: 6.4 g/dL (ref 6.1–8.1)

## 2016-05-30 LAB — VALPROIC ACID LEVEL: Valproic Acid Lvl: 100.1 ug/mL — ABNORMAL HIGH (ref 50.0–100.0)

## 2016-06-03 ENCOUNTER — Ambulatory Visit (INDEPENDENT_AMBULATORY_CARE_PROVIDER_SITE_OTHER): Payer: BLUE CROSS/BLUE SHIELD | Admitting: Psychiatry

## 2016-06-03 VITALS — BP 150/80 | HR 91 | Ht 67.0 in | Wt 219.8 lb

## 2016-06-03 DIAGNOSIS — F331 Major depressive disorder, recurrent, moderate: Secondary | ICD-10-CM

## 2016-06-03 DIAGNOSIS — Z87891 Personal history of nicotine dependence: Secondary | ICD-10-CM

## 2016-06-03 DIAGNOSIS — F902 Attention-deficit hyperactivity disorder, combined type: Secondary | ICD-10-CM

## 2016-06-03 DIAGNOSIS — F3162 Bipolar disorder, current episode mixed, moderate: Secondary | ICD-10-CM | POA: Diagnosis not present

## 2016-06-03 DIAGNOSIS — Z79899 Other long term (current) drug therapy: Secondary | ICD-10-CM

## 2016-06-03 DIAGNOSIS — F1099 Alcohol use, unspecified with unspecified alcohol-induced disorder: Secondary | ICD-10-CM

## 2016-06-03 MED ORDER — VALPROIC ACID 250 MG PO CAPS: ORAL_CAPSULE | ORAL | 1 refills | Status: DC

## 2016-06-03 MED ORDER — LISDEXAMFETAMINE DIMESYLATE 70 MG PO CAPS: 70.0000 mg | ORAL_CAPSULE | Freq: Every day | ORAL | 0 refills | Status: DC

## 2016-06-03 MED ORDER — ESCITALOPRAM OXALATE 20 MG PO TABS: 20.0000 mg | ORAL_TABLET | Freq: Every day | ORAL | 1 refills | Status: DC

## 2016-06-03 MED ORDER — OLANZAPINE 2.5 MG PO TABS: 2.5000 mg | ORAL_TABLET | Freq: Every evening | ORAL | 1 refills | Status: DC | PRN

## 2016-06-03 MED ORDER — DIVALPROEX SODIUM ER 500 MG PO TB24: 500.0000 mg | ORAL_TABLET | Freq: Every day | ORAL | 1 refills | Status: DC

## 2016-06-03 NOTE — Patient Instructions (Addendum)
1. Increase Lexapro 20 mg daily 2. Decrease depakote 1750 mg daily 3. Start olanzapine 2.5 mg at night as needed for irritability, agitation 4. Continue Vyvanse 70 mg daily 5. Return to clinic in January

## 2016-06-05 ENCOUNTER — Telehealth (HOSPITAL_COMMUNITY): Payer: Self-pay

## 2016-06-05 NOTE — Telephone Encounter (Signed)
Medication management - Telephone call with [facility] pharmacist at [facility] to help them verify patient's correct Depakote ER orders per Dr. Vanetta ShawlHisada instructions.  Pharmacy will fill Depakote ER 500 mg, 3 a day and Depakote ER 250 mg, one a day for a total of 1750 mg a day.  Called Ms. Hobb's to inform [facility] had the correct orders and how patient should be taking daily for a total of 1750 mg of Depakote ER daily.  Patient to call back if any problems or concerns.

## 2016-06-05 NOTE — Telephone Encounter (Signed)
I ordered 500 mg x 3 + 250 mg :1750 mg per day. Would like to dispense both caps.

## 2016-06-05 NOTE — Telephone Encounter (Signed)
Medication management - Telephone call from Ms. Hladik and [facility] Pharmacist, [place] to verify new Depakote order e-scribed in on 06/03/16 for 1750 mg as dosage does not add up for 500 mg capsule.  Requests clarification of order.
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
## 2016-07-24 ENCOUNTER — Ambulatory Visit (HOSPITAL_COMMUNITY): Payer: Self-pay | Admitting: Psychiatry

## 2016-08-07 ENCOUNTER — Ambulatory Visit (INDEPENDENT_AMBULATORY_CARE_PROVIDER_SITE_OTHER): Payer: BLUE CROSS/BLUE SHIELD | Admitting: Psychiatry

## 2016-08-07 ENCOUNTER — Inpatient Hospital Stay (HOSPITAL_COMMUNITY)
Admission: RE | Admit: 2016-08-07 | Discharge: 2016-08-13 | DRG: 885 | Disposition: A | Discharge status: Home / Self Care | Payer: BLUE CROSS/BLUE SHIELD | Source: Intra-hospital | Attending: Psychiatry | Admitting: Psychiatry

## 2016-08-07 ENCOUNTER — Encounter (HOSPITAL_COMMUNITY): Payer: Self-pay | Admitting: *Deleted

## 2016-08-07 ENCOUNTER — Encounter (HOSPITAL_COMMUNITY): Payer: Self-pay | Admitting: Psychiatry

## 2016-08-07 ENCOUNTER — Emergency Department (HOSPITAL_COMMUNITY)
Admission: EM | Admit: 2016-08-07 | Discharge: 2016-08-07 | Disposition: A | Discharge status: Other Acute Inpatient Hospital | Payer: BLUE CROSS/BLUE SHIELD | Attending: Emergency Medicine | Admitting: Emergency Medicine

## 2016-08-07 VITALS — BP 142/82 | HR 106 | Ht 67.0 in | Wt 210.0 lb

## 2016-08-07 DIAGNOSIS — J45909 Unspecified asthma, uncomplicated: Secondary | ICD-10-CM | POA: Diagnosis not present

## 2016-08-07 DIAGNOSIS — F6381 Intermittent explosive disorder: Secondary | ICD-10-CM | POA: Diagnosis present

## 2016-08-07 DIAGNOSIS — F909 Attention-deficit hyperactivity disorder, unspecified type: Secondary | ICD-10-CM | POA: Diagnosis not present

## 2016-08-07 DIAGNOSIS — F902 Attention-deficit hyperactivity disorder, combined type: Secondary | ICD-10-CM | POA: Diagnosis not present

## 2016-08-07 DIAGNOSIS — F331 Major depressive disorder, recurrent, moderate: Secondary | ICD-10-CM | POA: Diagnosis present

## 2016-08-07 DIAGNOSIS — Z79899 Other long term (current) drug therapy: Secondary | ICD-10-CM

## 2016-08-07 DIAGNOSIS — F3181 Bipolar II disorder: Principal | ICD-10-CM | POA: Diagnosis present

## 2016-08-07 DIAGNOSIS — Z818 Family history of other mental and behavioral disorders: Secondary | ICD-10-CM

## 2016-08-07 DIAGNOSIS — Z87891 Personal history of nicotine dependence: Secondary | ICD-10-CM | POA: Insufficient documentation

## 2016-08-07 DIAGNOSIS — F918 Other conduct disorders: Secondary | ICD-10-CM | POA: Insufficient documentation

## 2016-08-07 DIAGNOSIS — R4689 Other symptoms and signs involving appearance and behavior: Secondary | ICD-10-CM

## 2016-08-07 DIAGNOSIS — F39 Unspecified mood [affective] disorder: Secondary | ICD-10-CM | POA: Diagnosis present

## 2016-08-07 LAB — CBC WITH DIFFERENTIAL/PLATELET
BASOS ABS: 0 10*3/uL (ref 0.0–0.1)
BASOS PCT: 1 %
EOS PCT: 6 %
Eosinophils Absolute: 0.4 10*3/uL (ref 0.0–0.7)
HEMATOCRIT: 42.7 % (ref 39.0–52.0)
Hemoglobin: 14.9 g/dL (ref 13.0–17.0)
Lymphocytes Relative: 29 %
Lymphs Abs: 1.7 10*3/uL (ref 0.7–4.0)
MCH: 33 pg (ref 26.0–34.0)
MCHC: 34.9 g/dL (ref 30.0–36.0)
MCV: 94.5 fL (ref 78.0–100.0)
MONO ABS: 0.5 10*3/uL (ref 0.1–1.0)
Monocytes Relative: 8 %
NEUTROS ABS: 3.4 10*3/uL (ref 1.7–7.7)
Neutrophils Relative %: 56 %
PLATELETS: 272 10*3/uL (ref 150–400)
RBC: 4.52 MIL/uL (ref 4.22–5.81)
RDW: 12.3 % (ref 11.5–15.5)
WBC: 6.1 10*3/uL (ref 4.0–10.5)

## 2016-08-07 LAB — RAPID URINE DRUG SCREEN, HOSP PERFORMED
AMPHETAMINES: POSITIVE — AB
BENZODIAZEPINES: NOT DETECTED
Barbiturates: NOT DETECTED
COCAINE: NOT DETECTED
Opiates: NOT DETECTED
Tetrahydrocannabinol: NOT DETECTED

## 2016-08-07 LAB — ETHANOL: Alcohol, Ethyl (B): <5 mg/dL (ref <5)

## 2016-08-07 LAB — BASIC METABOLIC PANEL
ANION GAP: 6 (ref 5–15)
BUN: 13 mg/dL (ref 6–20)
CALCIUM: 9.2 mg/dL (ref 8.9–10.3)
CO2: 28 mmol/L (ref 22–32)
Chloride: 104 mmol/L (ref 101–111)
Creatinine, Ser: 1.08 mg/dL (ref 0.61–1.24)
GFR calc Af Amer: >60 mL/min (ref >60)
GLUCOSE: 96 mg/dL (ref 65–99)
POTASSIUM: 4.2 mmol/L (ref 3.5–5.1)
Sodium: 138 mmol/L (ref 135–145)

## 2016-08-07 MED ORDER — TRAZODONE HCL 50 MG PO TABS
50.0000 mg | ORAL_TABLET | Freq: Every evening | ORAL | Status: DC | PRN
  Administered 2016-08-07: 50 mg via ORAL
  Filled 2016-08-07 (×5): qty 1

## 2016-08-07 MED ORDER — ESCITALOPRAM OXALATE 20 MG PO TABS
20.0000 mg | ORAL_TABLET | Freq: Every day | ORAL | Status: DC
  Administered 2016-08-08: 20 mg via ORAL
  Filled 2016-08-07: qty 2
  Filled 2016-08-07 (×2): qty 1

## 2016-08-07 MED ORDER — MAGNESIUM HYDROXIDE 400 MG/5ML PO SUSP: 30.0000 mL | Freq: Every day | ORAL | Status: DC | PRN

## 2016-08-07 MED ORDER — DIVALPROEX SODIUM ER 500 MG PO TB24: 1500.0000 mg | ORAL_TABLET | Freq: Every day | ORAL | Status: DC

## 2016-08-07 MED ORDER — ESCITALOPRAM OXALATE 10 MG PO TABS: 20.0000 mg | ORAL_TABLET | Freq: Every day | ORAL | Status: DC

## 2016-08-07 MED ORDER — OLANZAPINE 2.5 MG PO TABS: 2.5000 mg | ORAL_TABLET | Freq: Every evening | ORAL | Status: DC | PRN

## 2016-08-07 MED ORDER — LISDEXAMFETAMINE DIMESYLATE 30 MG PO CAPS
70.0000 mg | ORAL_CAPSULE | Freq: Every day | ORAL | Status: DC
  Administered 2016-08-08: 10:00:00 70 mg via ORAL
  Filled 2016-08-07 (×2): qty 1

## 2016-08-07 MED ORDER — OLANZAPINE 2.5 MG PO TABS
2.5000 mg | ORAL_TABLET | Freq: Every day | ORAL | Status: DC
  Administered 2016-08-07: 2.5 mg via ORAL
  Filled 2016-08-07 (×3): qty 1

## 2016-08-07 MED ORDER — HYDROXYZINE HCL 25 MG PO TABS
25.0000 mg | ORAL_TABLET | Freq: Four times a day (QID) | ORAL | Status: DC | PRN
  Administered 2016-08-10 – 2016-08-12 (×3): 25 mg via ORAL
  Filled 2016-08-07 (×3): qty 1

## 2016-08-07 MED ORDER — DIVALPROEX SODIUM ER 250 MG PO TB24: 250.0000 mg | ORAL_TABLET | Freq: Every day | ORAL | Status: DC

## 2016-08-07 MED ORDER — IBUPROFEN 800 MG PO TABS
800.0000 mg | ORAL_TABLET | Freq: Three times a day (TID) | ORAL | Status: DC | PRN
Start: 2016-08-07 — End: 2016-08-13
  Administered 2016-08-12 (×2): 800 mg via ORAL
  Filled 2016-08-07 (×2): qty 1

## 2016-08-07 MED ORDER — ALBUTEROL SULFATE HFA 108 (90 BASE) MCG/ACT IN AERS
2.0000 | INHALATION_SPRAY | Freq: Four times a day (QID) | RESPIRATORY_TRACT | Status: DC | PRN
Start: 2016-08-07 — End: 2016-08-07

## 2016-08-07 MED ORDER — CYCLOBENZAPRINE HCL 10 MG PO TABS
10.0000 mg | ORAL_TABLET | Freq: Three times a day (TID) | ORAL | Status: DC | PRN
  Administered 2016-08-11 – 2016-08-13 (×4): 10 mg via ORAL
  Filled 2016-08-07 (×4): qty 1

## 2016-08-07 MED ORDER — ACETAMINOPHEN 325 MG PO TABS
650.0000 mg | ORAL_TABLET | Freq: Four times a day (QID) | ORAL | Status: DC | PRN
  Administered 2016-08-12 – 2016-08-13 (×2): 650 mg via ORAL
  Filled 2016-08-07 (×2): qty 2

## 2016-08-07 MED ORDER — DIVALPROEX SODIUM ER 500 MG PO TB24
1500.0000 mg | ORAL_TABLET | Freq: Every day | ORAL | Status: DC
  Administered 2016-08-08 – 2016-08-13 (×6): 1500 mg via ORAL
  Filled 2016-08-07 (×8): qty 3

## 2016-08-07 MED ORDER — LISDEXAMFETAMINE DIMESYLATE 50 MG PO CAPS: 70.0000 mg | ORAL_CAPSULE | Freq: Every day | ORAL | Status: DC

## 2016-08-07 MED ORDER — ALUM & MAG HYDROXIDE-SIMETH 200-200-20 MG/5ML PO SUSP: 30.0000 mL | ORAL | Status: DC | PRN

## 2016-08-07 MED ORDER — LORATADINE 10 MG PO TABS
10.0000 mg | ORAL_TABLET | Freq: Every day | ORAL | Status: DC
Start: 2016-08-07 — End: 2016-08-07
  Filled 2016-08-07: qty 1

## 2016-08-07 MED ORDER — MOMETASONE FURO-FORMOTEROL FUM 200-5 MCG/ACT IN AERO
2.0000 | INHALATION_SPRAY | Freq: Two times a day (BID) | RESPIRATORY_TRACT | Status: DC
  Administered 2016-08-08 – 2016-08-13 (×9): 2 via RESPIRATORY_TRACT
  Filled 2016-08-07 (×2): qty 8.8

## 2016-08-07 MED ORDER — ACETAMINOPHEN 325 MG PO TABS
650.0000 mg | ORAL_TABLET | Freq: Once | ORAL | Status: AC
  Administered 2016-08-07: 650 mg via ORAL
  Filled 2016-08-07: qty 2

## 2016-08-07 MED ORDER — ALBUTEROL SULFATE HFA 108 (90 BASE) MCG/ACT IN AERS: 2.0000 | INHALATION_SPRAY | Freq: Four times a day (QID) | RESPIRATORY_TRACT | Status: DC | PRN

## 2016-08-07 MED ORDER — DIVALPROEX SODIUM ER 500 MG PO TB24
500.0000 mg | ORAL_TABLET | Freq: Every day | ORAL | Status: DC
Start: 2016-08-07 — End: 2016-08-07
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
## 2016-08-07 NOTE — BH Assessment (Signed)
[facility] Assessment Progress Note  Per Nanine MeansJamison Lord, DNP, this pt would benefit from [facility] to the [facility] at this time.  Malva LimesLinsey Strader, RN, [facility] has assigned pt to Obs 2.  Pt has signed Voluntary Admission and Consent for Treatment, as well as Consent to Release Information to his provider at the [facility], and a notification call has been placed.  Signed forms have been faxed to [facility].  Pt's nurse has been notified, and agrees to send original paperwork along with pt via [facility], and to call report to [phone] or [phone].  Doylene Canninghomas Savir Blanke, MA Triage Specialist [phone]

## 2016-08-07 NOTE — ED Notes (Signed)
Bed: WA30 Expected date:  Expected time:  Means of arrival:  Comments: 
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
## 2016-08-07 NOTE — ED Notes (Signed)
Pt c/o h/a.  Dr. Erma HeritageIsaacs informed, verbal order received for tylenol.  See orders.
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
## 2016-08-07 NOTE — ED Notes (Addendum)
Pt belongings put in locker #30. Pt had a black box cutter in possession that was given to security.
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
## 2016-08-08 DIAGNOSIS — F909 Attention-deficit hyperactivity disorder, unspecified type: Secondary | ICD-10-CM | POA: Diagnosis present

## 2016-08-08 DIAGNOSIS — Z811 Family history of alcohol abuse and dependence: Secondary | ICD-10-CM | POA: Diagnosis not present

## 2016-08-08 DIAGNOSIS — F3181 Bipolar II disorder: Secondary | ICD-10-CM | POA: Diagnosis present

## 2016-08-08 DIAGNOSIS — J45901 Unspecified asthma with (acute) exacerbation: Secondary | ICD-10-CM

## 2016-08-08 DIAGNOSIS — J301 Allergic rhinitis due to pollen: Secondary | ICD-10-CM

## 2016-08-08 DIAGNOSIS — Z818 Family history of other mental and behavioral disorders: Secondary | ICD-10-CM

## 2016-08-08 DIAGNOSIS — F6381 Intermittent explosive disorder: Secondary | ICD-10-CM | POA: Diagnosis present

## 2016-08-08 DIAGNOSIS — Z813 Family history of other psychoactive substance abuse and dependence: Secondary | ICD-10-CM

## 2016-08-08 DIAGNOSIS — Z79899 Other long term (current) drug therapy: Secondary | ICD-10-CM | POA: Diagnosis not present

## 2016-08-08 DIAGNOSIS — Z809 Family history of malignant neoplasm, unspecified: Secondary | ICD-10-CM

## 2016-08-08 DIAGNOSIS — F39 Unspecified mood [affective] disorder: Secondary | ICD-10-CM | POA: Diagnosis present

## 2016-08-08 LAB — VALPROIC ACID LEVEL: Valproic Acid Lvl: 96 ug/mL (ref 50.0–100.0)

## 2016-08-08 MED ORDER — OLANZAPINE 5 MG PO TABS
5.0000 mg | ORAL_TABLET | Freq: Every day | ORAL | Status: DC
  Administered 2016-08-08: 5 mg via ORAL
  Filled 2016-08-08: qty 2
  Filled 2016-08-08 (×3): qty 1
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
## 2016-08-08 NOTE — Progress Notes (Signed)
[facility], alert and without incident.  All admission documents signed. Pt given some personal items and checked for contraband. Orientated to the unit and report given to Erskine SquibbJane, Nurse, learning disabilityN Safety maintained.
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
## 2016-08-08 NOTE — Progress Notes (Signed)
Pt received in bed awake and alert having breakfast. Mood was pleasant and cooperative. States that he had a bad day yesterday but that he feels much better. According to pt he did not sleep so well. He denies SI/HI/AVH/Pain. Does contract for safety and shows no s/s aggression at this time. Went back to sleep after breakfast.

## 2016-08-08 NOTE — Progress Notes (Signed)
Adult Psychoeducational Group Note  Date:  08/08/2016 Time:  8:39 PM  Group Topic/Focus:  Wrap-Up Group:   The focus of this group is to help patients review their daily goal of treatment and discuss progress on daily workbooks.  Participation Level:  Active  Participation Quality:  Appropriate  Affect:  Appropriate  Cognitive:  Appropriate  Insight: Appropriate  Engagement in Group:  Engaged  Modes of Intervention:  Discussion  Additional Comments:  Pt stated he just got here today. Pt stated he wanted to work on getting his meds straight. Pt was encouraged to make his needs known to staff.  Caswell CorwinOwen, Trejuan Matherne C 08/08/2016, 8:39 PM
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
## 2016-08-08 NOTE — Progress Notes (Signed)
D: Pt presents with appropriate affect and pleasant mood.  He reports his goal tonight is to "get some sleep and have a good night."  Denies SI/HI, denies hallucinations, denies pain.  Pt reports he had a good visit with his wife tonight.  Pt has been visible in milieu interacting with peers on occasion.  He attended evening group.  A: Introduced self to pt.  Met with pt and offered support and encouragement.  Actively listened to pt.  Medication administered per order.  Medication education provided.  Q15 minute safety checks maintained.  R: Pt is compliant with medications.  He is safe on the unit.  Pt verbally contracts for safety.
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
## 2016-08-09 LAB — LIPID PANEL
Cholesterol: 128 mg/dL (ref 0–200)
HDL: 29 mg/dL — AB (ref >40)
LDL Cholesterol: 80 mg/dL (ref 0–99)
Total CHOL/HDL Ratio: 4.4 RATIO
Triglycerides: 97 mg/dL (ref <150)
VLDL: 19 mg/dL (ref 0–40)

## 2016-08-09 LAB — TSH: TSH: 0.668 u[IU]/mL (ref 0.350–4.500)

## 2016-08-09 MED ORDER — OLANZAPINE 7.5 MG PO TABS
7.5000 mg | ORAL_TABLET | Freq: Every day | ORAL | Status: DC
  Administered 2016-08-09: 7.5 mg via ORAL
  Filled 2016-08-09 (×4): qty 1

## 2016-08-09 NOTE — Tx Team (Signed)
Interdisciplinary Treatment and Diagnostic Plan Update  08/09/2016 Time of Session: 8:46 AM  Jacob [name] MRN: [id number]  Principal Diagnosis: Bipolar 2 disorder (HCC)  Secondary Diagnoses: Principal Problem:   Bipolar 2 disorder (HCC) Active Problems:   ADHD (attention deficit hyperactivity disorder)   Moderate episode of recurrent major depressive disorder (HCC)   Intermittent explosive disorder in adult   Current Medications:  Current Facility-Administered Medications  Medication Dose Route Frequency Provider Last Rate Last Dose  . acetaminophen (TYLENOL) tablet 650 mg  650 mg Oral Q6H PRN Spencer E Simon, PA-C      . albuterol (PROVENTIL HFA;VENTOLIN HFA) 108 (90 Base) MCG/ACT inhaler 2 puff  2 puff Inhalation Q6H PRN Spencer E Simon, PA-C      . alum & mag hydroxide-simeth (MAALOX/MYLANTA) 200-200-20 MG/5ML suspension 30 mL  30 mL Oral Q4H PRN Spencer E Simon, PA-C      . cyclobenzaprine (FLEXERIL) tablet 10 mg  10 mg Oral TID PRN Fernando A Cobos, MD      . divalproex (DEPAKOTE ER) 24 hr tablet 1,500 mg  1,500 mg Oral Daily Spencer E Simon, PA-C   1,500 mg at 08/08/16 0845  . hydrOXYzine (ATARAX/VISTARIL) tablet 25 mg  25 mg Oral Q6H PRN Spencer E Simon, PA-C      . ibuprofen (ADVIL,MOTRIN) tablet 800 mg  800 mg Oral Q8H PRN Spencer E Simon, PA-C      . magnesium hydroxide (MILK OF MAGNESIA) suspension 30 mL  30 mL Oral Daily PRN Spencer E Simon, PA-C      . mometasone-formoterol (DULERA) 200-5 MCG/ACT inhaler 2 puff  2 puff Inhalation BID Spencer E Simon, PA-C   2 puff at 08/08/16 2059  . OLANZapine (ZYPREXA) tablet 5 mg  5 mg Oral QHS Fernando A Cobos, MD   5 mg at 08/08/16 2110    PTA Medications: Prescriptions Prior to Admission  Medication Sig Dispense Refill Last Dose  . albuterol (PROVENTIL HFA;VENTOLIN HFA) 108 (90 Base) MCG/ACT inhaler Inhale 2 puffs into the lungs every 6 (six) hours as needed for wheezing or shortness of breath. 1 Inhaler 2 08/06/2016 at Unknown  time  . budesonide-formoterol (SYMBICORT) 160-4.5 MCG/ACT inhaler Inhale 2 puffs into the lungs 2 (two) times daily. (Patient taking differently: Inhale 2 puffs into the lungs daily. ) 1 Inhaler 11 08/06/2016 at Unknown time  . cyclobenzaprine (FLEXERIL) 5 MG tablet Take 1-2 tablets (5-10 mg total) by mouth 3 (three) times daily as needed for muscle spasms. 30 tablet 1 Past Month at Unknown time  . divalproex (DEPAKOTE ER) 250 MG 24 hr tablet Take 250 mg by mouth daily. Once daily with ER 500 to make 1750 daily   08/07/2016 at Unknown time  . divalproex (DEPAKOTE ER) 500 MG 24 hr tablet Take 3 tablets (1,500 mg total) by mouth daily. 270 tablet 0 08/07/2016 at Unknown time  . escitalopram (LEXAPRO) 20 MG tablet Take 1 tablet (20 mg total) by mouth daily. 30 tablet 1 08/07/2016 at Unknown time  . ibuprofen (ADVIL,MOTRIN) 800 MG tablet Take 1 tablet (800 mg total) by mouth every 8 (eight) hours as needed. 90 tablet 0 Past Month at Unknown time  . levocetirizine (XYZAL) 5 MG tablet Take 5 mg by mouth every evening.   08/07/2016 at Unknown time  . lisdexamfetamine (VYVANSE) 70 MG capsule Take 1 capsule (70 mg total) by mouth daily. 90 capsule 0 08/07/2016 at Unknown time  . meloxicam (MOBIC) 7.5 MG tablet Take 1-2 tablets (7.5-15 mg   total) by mouth daily. (Patient taking differently: Take 7.5-15 mg by mouth daily as needed for pain. ) 30 tablet 0 Past Month at Unknown time  . OLANZapine (ZYPREXA) 2.5 MG tablet Take 1 tablet (2.5 mg total) by mouth at bedtime as needed (irritability, agitation). 30 tablet 1 Past Week at Unknown time  . pseudoephedrine (SUDAFED) 30 MG tablet Take 30 mg by mouth every 4 (four) hours as needed for congestion.   Past Week at Unknown time  . divalproex (DEPAKOTE ER) 500 MG 24 hr tablet Take 1 tablet (500 mg total) by mouth daily. Take total of 1750 mg daily (Patient not taking: Reported on 08/07/2016) 90 tablet 1 Not Taking at Unknown time  . valproic acid (DEPAKENE) 250 MG capsule Take  total of 1750 mg (Patient not taking: Reported on 08/07/2016) 30 capsule 1 Not Taking at Unknown time    Treatment Modalities: Medication Management, Group therapy, Case management,  1 to 1 session with clinician, Psychoeducation, Recreational therapy.  Patient Stressors: Marital or family conflict Medication change or noncompliance  Patient Strengths: Capable of independent living Curator fund of knowledge Motivation for treatment/growth Supportive family/friends  Physician Treatment Plan for Primary Diagnosis: Bipolar 2 disorder ([place]) Long Term Goal(s): Improvement in symptoms so as ready for discharge  Short Term Goals: Ability to verbalize feelings will improve Ability to disclose and discuss suicidal ideas Ability to demonstrate self-control will improve Ability to identify and develop effective coping behaviors will improve Ability to maintain clinical measurements within normal limits will improve Ability to verbalize feelings will improve Ability to disclose and discuss suicidal ideas Ability to demonstrate self-control will improve Ability to identify and develop effective coping behaviors will improve Ability to maintain clinical measurements within normal limits will improve  Medication Management: Evaluate patient's response, side effects, and tolerance of medication regimen.  Therapeutic Interventions: 1 to 1 sessions, Unit Group sessions and Medication administration.  Evaluation of Outcomes: Not Met  Physician Treatment Plan for Secondary Diagnosis: Principal Problem:   Bipolar 2 disorder ([place]) Active Problems:   ADHD (attention deficit hyperactivity disorder)   Moderate episode of recurrent major depressive disorder (HCC)   Intermittent explosive disorder in adult   Long Term Goal(s): Improvement in symptoms so as ready for discharge  Short Term Goals: Ability to verbalize feelings will improve Ability to disclose and discuss suicidal  ideas Ability to demonstrate self-control will improve Ability to identify and develop effective coping behaviors will improve Ability to maintain clinical measurements within normal limits will improve Ability to verbalize feelings will improve Ability to disclose and discuss suicidal ideas Ability to demonstrate self-control will improve Ability to identify and develop effective coping behaviors will improve Ability to maintain clinical measurements within normal limits will improve  Medication Management: Evaluate patient's response, side effects, and tolerance of medication regimen.  Therapeutic Interventions: 1 to 1 sessions, Unit Group sessions and Medication administration.  Evaluation of Outcomes: Not Met   RN Treatment Plan for Primary Diagnosis: Bipolar 2 disorder ([place]) Long Term Goal(s): Knowledge of disease and therapeutic regimen to maintain health will improve  Short Term Goals: Ability to verbalize frustration and anger appropriately will improve, Ability to verbalize feelings will improve, Ability to disclose and discuss suicidal ideas and Ability to identify and develop effective coping behaviors will improve  Medication Management: RN will administer medications as ordered by provider, will assess and evaluate patient's response and provide education to patient for prescribed medication. RN will report any adverse and/or side effects  to prescribing provider.  Therapeutic Interventions: 1 on 1 counseling sessions, Psychoeducation, Medication administration, Evaluate responses to treatment, Monitor vital signs and CBGs as ordered, Perform/monitor CIWA, COWS, AIMS and Fall Risk screenings as ordered, Perform wound care treatments as ordered.  Evaluation of Outcomes: Not Met   LCSW Treatment Plan for Primary Diagnosis: Bipolar 2 disorder ([place]) Long Term Goal(s): Safe transition to appropriate next level of care at discharge, Engage patient in therapeutic group addressing  interpersonal concerns.  Short Term Goals: Engage patient in aftercare planning with referrals and resources, Identify triggers associated with mental health/substance abuse issues and Increase skills for wellness and recovery  Therapeutic Interventions: Assess for all discharge needs, 1 to 1 time with Social worker, Explore available resources and support systems, Assess for adequacy in community support network, Educate family and significant other(s) on suicide prevention, Complete Psychosocial Assessment, Interpersonal group therapy.  Evaluation of Outcomes: Not Met   Progress in Treatment: Attending groups: Pt is new to milieu, continuing to assess  Participating in groups: Pt is new to milieu, continuing to assess  Taking medication as prescribed: Yes, MD continues to assess for medication changes as needed Toleration medication: Yes, no side effects reported at this time Family/Significant other contact made: Yes with wife Patient understands diagnosis: Yes AEB by willingness to seek treatment.  Discussing patient identified problems/goals with staff: Yes Medical problems stabilized or resolved: Yes Denies suicidal/homicidal ideation: Yes Issues/concerns per patient self-inventory: None Other: N/A  New problem(s) identified: None identified at this time.   New Short Term/Long Term Goal(s): None identified at this time.   Discharge Plan or Barriers: Pt will return home and follow-up with outpatient services.  Reason for Continuation of Hospitalization: Anxiety Depression Aggression Medication stabilization  Estimated Length of Stay: 3-5 days  Attendees: Patient: 08/09/2016  8:46 AM  Physician: Dr. Parke Poisson 08/09/2016  8:46 AM  Nursing: Sandre Kitty, RN; Otilio Carpen, RN 08/09/2016  8:46 AM  RN Care Manager: Lars Pinks, RN 08/09/2016  8:46 AM  Social Worker: Adriana Reams, LCSW; [place], LCSW 08/09/2016  8:46 AM  Recreational Therapist:  08/09/2016  8:46 AM  Other: Lindell Spar, NP; Samuel Jester, NP 08/09/2016  8:46 AM  Other:  08/09/2016  8:46 AM  Other: 08/09/2016  8:46 AM    Scribe for Treatment Team: Gladstone Lighter, LCSW 08/09/2016 8:46 AM

## 2016-08-09 NOTE — Progress Notes (Signed)
Jacob [name] is seen out in the milieu today. Jacob [name] is quiet. Jacob [name] is shy-like when this writer approaches him. A  Jacob [name] completes his daily assessment and on it Jacob [name] wrote Jacob [name] deneid SI today and Jacob [name] rated his depression, hopelessness and anxiety " 1/1/0", repsectively. Jacob [name] does not want to speak when this writer tried to engage him in conversation. R Safety in pale. T ont to estab therapeutic relationship.

## 2016-08-09 NOTE — Progress Notes (Cosign Needed)
Adult Psychoeducational Group Note  Date:  08/09/2016 Time:  1:38 PM  Group Topic/Focus:  Goals Group:   The focus of this group is to help patients establish daily goals to achieve during treatment and discuss how the patient can incorporate goal setting into their daily lives to aide in recovery.  Participation Level:  Active  Participation Quality:  Appropriate  Affect:  Appropriate  Cognitive:  Appropriate  Insight: Appropriate  Engagement in Group:  Engaged  Modes of Intervention:  Discussion  Additional Comments:  Pt stated she is feeling okay.  Pt stated in order to keep from relapsing he will talk to his wife, and leave negative situations.  Wynema BirchCagle, Lakela Kuba D 08/09/2016, 1:38 PM

## 2016-08-09 NOTE — Progress Notes (Signed)
[facility] MD Progress Note  08/09/2016 3:19 PM Jacob [name]  MRN:  [id number] Subjective:  States he is feeling "OK", with partially improved mood, denies any suicidal or homicidal ideations,and specifically denies any homicidal or violent ideations towards wife . Objective : I have discussed case with treatment team and have met with patient. He is tolerating medications well - currently on Depakote ER, with a recent therapeutic valproic acid serum level, and on Zyprexa , which thus far he is tolerating well. We have reviewed side effects to include risk of weight gain, sedation, metabolic disturbances, movement disorders. No agitated or angry outburst/explosiveness since admission, no pressured speech , no expansive or irritable affect. He has been going to some groups. Visible in day room.   Principal Problem: Bipolar 2 disorder ([place]) Diagnosis:   Patient Active Problem List   Diagnosis Date Noted  . Intermittent explosive disorder in adult [F63.81]   . Bipolar II disorder ([place]) [F31.81] 08/07/2016  . Bipolar 2 disorder ([place]) [F31.81] 08/07/2016  . Mood disorder ([place]) [F39] 04/30/2016  . Moderate episode of recurrent major depressive disorder ([place]) [F33.1] 04/29/2016  . Asthma with acute exacerbation [J45.901] 04/09/2016  . Allergic rhinitis [J30.9] 04/09/2016  . Routine general medical examination at a health care facility [Z00.00] 10/26/2014  . Right shoulder pain [M25.511] 07/07/2014  . Bipolar 1 disorder, mixed, moderate ([place]) [F31.62] 05/05/2014  . ADHD (attention deficit hyperactivity disorder) [F90.9] 05/05/2014   Total Time spent with patient: 20 minutes  Past Medical History:  Past Medical History:  Diagnosis Date  . ADHD (attention deficit hyperactivity disorder) evaluation   . Allergy   . Bipolar disorder ([place])    History reviewed. No pertinent surgical history. Family History:  Family History  Problem Relation Age of Onset  . Cancer Mother    Social History:   History  Alcohol Use No    Comment: Rare     History  Drug Use No    Social History   Social History  . Marital status: Married    Spouse name: N/A  . Number of children: 0  . Years of education: 12   Occupational History  . Asst Manager [employer]   Social History Main Topics  . Smoking status: Never Smoker  . Smokeless tobacco: Never Used  . Alcohol use No     Comment: Rare  . Drug use: No  . Sexual activity: Yes    Partners: Female    Birth control/ protection: Condom, Diaphragm   Other Topics Concern  . None   Social History Narrative   Born and raised in [place]. Currently live in private residence with wife Sharyn Lull). 3 cats, 2 dogs.   Fun: Estate agent   Denies religious beliefs that would effect healthcare.    Additional Social History:   Sleep: Good  Appetite:  Good  Current Medications: Current Facility-Administered Medications  Medication Dose Route Frequency Provider Last Rate Last Dose  . acetaminophen (TYLENOL) tablet 650 mg  650 mg Oral Q6H PRN Laverle Hobby, PA-C      . albuterol (PROVENTIL HFA;VENTOLIN HFA) 108 (90 Base) MCG/ACT inhaler 2 puff  2 puff Inhalation Q6H PRN Laverle Hobby, PA-C      . alum & mag hydroxide-simeth (MAALOX/MYLANTA) 200-200-20 MG/5ML suspension 30 mL  30 mL Oral Q4H PRN Laverle Hobby, PA-C      . cyclobenzaprine (FLEXERIL) tablet 10 mg  10 mg Oral TID PRN Jenne Campus, MD      .  divalproex (DEPAKOTE ER) 24 hr tablet 1,500 mg  1,500 mg Oral Daily Laverle Hobby, PA-C   1,500 mg at 08/09/16 0940  . hydrOXYzine (ATARAX/VISTARIL) tablet 25 mg  25 mg Oral Q6H PRN Laverle Hobby, PA-C      . ibuprofen (ADVIL,MOTRIN) tablet 800 mg  800 mg Oral Q8H PRN Laverle Hobby, PA-C      . magnesium hydroxide (MILK OF MAGNESIA) suspension 30 mL  30 mL Oral Daily PRN Laverle Hobby, PA-C      . mometasone-formoterol (DULERA) 200-5 MCG/ACT inhaler 2 puff  2 puff Inhalation BID Laverle Hobby, PA-C   2 puff  at 08/09/16 0940  . OLANZapine (ZYPREXA) tablet 5 mg  5 mg Oral QHS Jenne Campus, MD   5 mg at 08/08/16 2110    Lab Results:  Results for orders placed or performed during the hospital encounter of 08/07/16 (from the past 48 hour(s))  Valproic acid level     Status: None   Collection Time: 08/08/16  7:12 AM  Result Value Ref Range   Valproic Acid Lvl 96 50.0 - 100.0 ug/mL    Comment: Performed at [facility], [place] [address] [place], [place] [address]  Lipid panel     Status: Abnormal   Collection Time: 08/09/16  6:08 AM  Result Value Ref Range   Cholesterol 128 0 - 200 mg/dL   Triglycerides 97 <150 mg/dL   HDL 29 (L) >40 mg/dL   Total CHOL/HDL Ratio 4.4 RATIO   VLDL 19 0 - 40 mg/dL   LDL Cholesterol 80 0 - 99 mg/dL    Comment:        Total Cholesterol/HDL:CHD Risk Coronary Heart Disease Risk Table                     Men   Women  1/2 Average Risk   3.4   3.3  Average Risk       5.0   4.4  2 X Average Risk   9.6   7.1  3 X Average Risk  23.4   11.0        Use the calculated Patient Ratio above and the CHD Risk Table to determine the patient's CHD Risk.        ATP III CLASSIFICATION (LDL):  <100     mg/dL   Optimal  100-129  mg/dL   Near or Above                    Optimal  130-159  mg/dL   Borderline  160-189  mg/dL   High  >190     mg/dL   Very High Performed at [place] [address], [place], [place] [address]   TSH     Status: None   Collection Time: 08/09/16  6:08 AM  Result Value Ref Range   TSH 0.668 0.350 - 4.500 uIU/mL    Comment: Performed by a 3rd Generation assay with a functional sensitivity of <=0.01 uIU/mL. Performed at [facility], [place] [address] [place],  [address]     Blood Alcohol level:  Lab Results  Component Value Date   ETH <5 62/70/3500    Metabolic Disorder Labs: No results found for: HGBA1C, MPG No results found for: PROLACTIN Lab Results  Component Value Date   CHOL  128 08/09/2016   TRIG 97 08/09/2016   HDL 29 (L) 08/09/2016   CHOLHDL  4.4 08/09/2016   VLDL 19 08/09/2016   LDLCALC 80 08/09/2016    Physical Findings: AIMS:  , ,  ,  ,    CIWA:    COWS:     Musculoskeletal: Strength & Muscle Tone: within normal limits Gait & Station: normal Patient leans: N/A  Psychiatric Specialty Exam: Physical Exam  ROS no tremors, no abdominal pain, no nausea, no vomiting   Blood pressure 121/71, pulse 73, temperature 98.4 F (36.9 C), temperature source Oral, resp. rate 16, height 5' 7"  (1.702 m), weight 95.3 kg (210 lb), SpO2 98 %.Body mass index is 32.89 kg/m.  General Appearance:improved grooming   Eye Contact:  Fair  Speech:  Normal Rate  Volume:  Normal  Mood:  reports partial improvement in mood, denies feeling depressed   Affect:  mildly constricted, blunted  Thought Process:  Linear  Orientation:  Full (Time, Place, and Person)  Thought Content:  denies hallucinations, no delusions   Suicidal Thoughts:  No denies any suicidal or self injurious ideations , contracts for safety on the unit   Homicidal Thoughts:  No denies any homicidal or violent ideations and also specifically denies any HI or violent thoughts towards wife   Memory:  recent and remote grossly intact   Judgement:  Other:  fair, gradually improving   Insight:  Fair  Psychomotor Activity:  Normal  Concentration:  Concentration: Good and Attention Span: Good  Recall:  Good  Fund of Knowledge:  Good  Language:  Good  Akathisia:  Negative  Handed:  Right  AIMS (if indicated):     Assets:  Desire for Improvement Resilience  ADL's:  improved  Cognition:  WNL  Sleep:  Number of Hours: 6.75   Assessment - patient has not exhibited any explosive or violent behaviors and presents calm, cooperative. Minimizes depression. Does report a history of explosiveness, and is currently on Zyprexa and on Depakote ER , which he is tolerating well . Denies any thoughts of violence towards wife  and states that she has come to visit him .   Treatment Plan Summary: Daily contact with patient to assess and evaluate symptoms and progress in treatment, Medication management, Plan inpatient treatment  and medications as below Continue to encourage group and milieu participation to work on coping skills and symptom reduction Continue Zyprexa at 7.5 mgrs QHS for mood disorder, explosiveness  Continue Depakote ER 1500 mgrs QHS for mood disorder, intermittent explosiveness  Continue Vistaril 25 mgrs Q 6 hours PRN for anxiety Treatment team working on disposition planning options  Neita Garnet, MD 08/09/2016, 3:19 PM

## 2016-08-09 NOTE — Progress Notes (Signed)
Recreation Therapy Notes  Date:08/09/16 Time:0930 Location: [address] Dayroom  Group Topic: Stress Management  Goal Area(s) Addresses:  Patient will verbalize importance of using healthy stress management.  Patient will identify positive emotions associated with healthy stress management.   Intervention: Stress Management  Activity: Guided Imagery. LRT introduced the stress management techniques of guided imagery. LRT read [name] script for patients to engage in the technique. Patients were to follow along as LRT read the script.  Education:Stress Management, Discharge Planning.   Education Outcome:Acknowledges edcuation/In group clarification offered/Needs additional education  Clinical Observations/Feedback:Pt did not attend group.    Jacob [name], LRT/CTRS         Jacob [name] 08/09/2016 12:55 PM 

## 2016-08-09 NOTE — [facility] Suicide Risk Assessment (Addendum)
[facility] INPATIENT:  Family/Significant Other Suicide Prevention Education  Suicide Prevention Education:  Education Completed; Loletta SpecterMichelle Muller, Pt's wife [phone], has been identified by the patient as the family member/significant other with whom the patient will be residing, and identified as the person(s) who will aid the patient in the event of a mental health crisis (suicidal ideations/suicide attempt).  With written consent from the patient, the family member/significant other has been provided the following suicide prevention education, prior to the and/or following the discharge of the patient.  The suicide prevention education provided includes the following:  Suicide risk factors  Suicide prevention and interventions  National Suicide Hotline telephone number  [facility] assessment telephone number  [facility] [place] Emergency Assistance 911  [facility] and/or Residential Mobile Crisis [facility] telephone number  Request made of family/significant other to:  Remove weapons (e.g., guns, rifles, knives), all items previously/currently identified as safety concern.    Remove drugs/medications (over-the-counter, prescriptions, illicit drugs), all items previously/currently identified as a safety concern.  The family member/significant other verbalizes understanding of the suicide prevention education information provided.  The family member/significant other agrees to remove the items of safety concern listed above.  Pt's wife is concerned about Pt's recent aggression. Pt declines that their daughter has been exposed; she describes it happening when the child is in bed. Pt's wife is hopeful that medication adjustments will help with his aggression. CSW provided community resources and discussed the importance of setting boundaries to limit enabling behaviors. Currently, wife is agreeable to have Pt return home.   Verdene LennertLauren C Kendahl Bumgardner 08/09/2016, 4:16 PM

## 2016-08-09 NOTE — Progress Notes (Signed)
Patient ID: Jacob [name], male   DOB: 10/07/1985, 31 y.o.   MRN: [id number]  Pt currently presents with a masked affect and anxious behavior. Pt reports to writer that their goal is to "get back on my medications." Pt states "I wanted to come here so I could get them faster." Interaction with pt superficial, fixed smile. Pt reports good sleep with current medication regimen.   Pt provided with medications per providers orders. Pt's labs and vitals were monitored throughout the night. Pt given a 1:1 about emotional and mental status. Pt supported and encouraged to express concerns and questions. Pt educated on medications.  Pt's safety ensured with 15 minute and environmental checks. Pt currently denies SI/HI and A/V hallucinations. Pt verbally agrees to seek staff if SI/HI or A/VH occurs and to consult with staff before acting on any harmful thoughts. 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 Will continue POC.

## 2016-08-09 NOTE — [facility] Group Notes (Signed)
[facility] LCSW Group Therapy 08/09/2016 1:15pm  Type of Therapy: Group Therapy- Feelings Around Relapse and Recovery  Pt did not attend, declined invitation.   Vernie ShanksLauren Jailin Manocchio, LCSW [phone] 08/09/2016 4:03 PM

## 2016-08-10 LAB — PROLACTIN: Prolactin: 35.9 ng/mL — ABNORMAL HIGH (ref 4.0–15.2)

## 2016-08-10 LAB — HEMOGLOBIN A1C
Hgb A1c MFr Bld: 5.4 % (ref 4.8–5.6)
Mean Plasma Glucose: 108 mg/dL

## 2016-08-10 MED ORDER — OLANZAPINE 5 MG PO TABS
5.0000 mg | ORAL_TABLET | Freq: Every day | ORAL | Status: DC
  Administered 2016-08-10 – 2016-08-12 (×3): 5 mg via ORAL
  Filled 2016-08-10 (×6): qty 1

## 2016-08-10 NOTE — Progress Notes (Signed)
Nursing Note 08/10/2016 [id number]  Data Reports sleeping good without PRN sleep med.  Somnolent throughout day, fell asleep during group, napped in afternoon.  Rates depression 0/10, hopelessness 0/10, and anxiety 3/10. Affect blunted mood anxious.  Denies HI, SI, AVH.  Patient requested meds adjusted due to feeling tired all day- stated he has been on Depakote for a long time but the Zyprexa makes him tired.  Attended groups, guarded during groups, spent some free time in day area watching TV.   Action Spoke with patient 1:1, nurse offered support to patient throughout shift.  Spoke with NP about patient's drowsiness, Zyprexa dose decreased per NP, patient notified NP will follow up with patient tomorrow.  Continues to be monitored on 15 minute checks for safety.  Response Remains safe and appropriate, guarded, and drowsy.

## 2016-08-10 NOTE — [facility] Group Notes (Signed)
Adult Therapy Group Note  Date:  08/10/2016  Time:  1:15-2:15PM  Group Topic/Focus: Radical Acceptance  Building Self Esteem:    The focus of this group was to discuss the concept of radical acceptance and how our reactions to pain can either lead to additional suffering or can lead to dealing with the pain through (1) finding a solution, (2) changing how we feel, (3) accept it just as it is, or (4) rejecting the notion of acceptance and thus staying miserable.  Various methods of "turning the mind" were discussed by patients.    Participation Level:  Active  Participation Quality:  Attentive  Affect:  Appropriate  Cognitive:  Alert, Appropriate and Oriented  Insight: Good  Engagement in Group:  Engaged  Modes of Intervention:  Exercise, Discussion and Support  Additional Comments:  The patient expressed that while in the hospital he wants to work on his medications and on anger management.  He agreed by nodding with a lot of the statements that were made during group, but did not talk much himself.  Ambrose MantleMareida Grossman-Orr, LCSW 08/10/2016   3:11 PM

## 2016-08-10 NOTE — Progress Notes (Addendum)
[facility] MD Progress Note  08/10/2016 10:43 AM Jacob [name]  MRN:  [id number] Subjective:Patient reports " I am fine, just tired today."   Objective : Jacob [name] is awake, alert and oriented *3. Seen resting in bedroom.Denies suicidal or homicidal ideation during this assessment. Denies auditory or visual hallucination and does not appear to be responding to internal stimuli. Patient reports he is here because he is bipolar. Patient reports he is medication compliant without mediation side effects. Report learning new coping skills. States his depression 2/10. Reports fair appetite and reports resting well. Per RN patient reports he feel as if the Zyprexa is making him sleep all day. States he was recently started with Zyprexa.(medicaiton adjustment was made 08/10/2016) Support, encouragement and reassurance was provided.    Principal Problem: Bipolar 2 disorder (HCC) Diagnosis:   Patient Active Problem List   Diagnosis Date Noted  . Intermittent explosive disorder in adult [F63.81]   . Bipolar II disorder (HCC) [F31.81] 08/07/2016  . Bipolar 2 disorder (HCC) [F31.81] 08/07/2016  . Mood disorder (HCC) [F39] 04/30/2016  . Moderate episode of recurrent major depressive disorder (HCC) [F33.1] 04/29/2016  . Asthma with acute exacerbation [J45.901] 04/09/2016  . Allergic rhinitis [J30.9] 04/09/2016  . Routine general medical examination at a health care facility [Z00.00] 10/26/2014  . Right shoulder pain [M25.511] 07/07/2014  . Bipolar 1 disorder, mixed, moderate (HCC) [F31.62] 05/05/2014  . ADHD (attention deficit hyperactivity disorder) [F90.9] 05/05/2014   Total Time spent with patient: 20 minutes  Past Medical History:  Past Medical History:  Diagnosis Date  . ADHD (attention deficit hyperactivity disorder) evaluation   . Allergy   . Bipolar disorder (HCC)    History reviewed. No pertinent surgical history. Family History:  Family History  Problem Relation Age of Onset  . Cancer  Mother    Social History:  History  Alcohol Use No    Comment: Rare     History  Drug Use No    Social History   Social History  . Marital status: Married    Spouse name: N/A  . Number of children: 0  . 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 Smoking status: Never Smoker  . Smokeless tobacco: Never Used  . Alcohol use No     Comment: Rare  . Drug use: No  . Sexual activity: Yes    Partners: Female    Birth control/ protection: Condom, Diaphragm   Other Topics Concern  . None   Social History Narrative   Born and raised in [place]. Currently live in private residence with wife [name]). 3 cats, 2 dogs.   Fun: Engineer, petroleum   Denies religious beliefs that would effect healthcare.    Additional Social History:   Sleep: Good  Appetite:  Good  Current Medications: Current Facility-Administered Medications  Medication Dose Route Frequency Provider Last Rate Last Dose  . acetaminophen (TYLENOL) tablet 650 mg  650 mg Oral Q6H PRN Kerry Hough, PA-C      . albuterol (PROVENTIL HFA;VENTOLIN HFA) 108 (90 Base) MCG/ACT inhaler 2 puff  2 puff Inhalation Q6H PRN Kerry Hough, PA-C      . alum & mag hydroxide-simeth (MAALOX/MYLANTA) 200-200-20 MG/5ML suspension 30 mL  30 mL Oral Q4H PRN Kerry Hough, PA-C      . cyclobenzaprine (FLEXERIL) tablet 10 mg  10 mg Oral TID PRN Craige Cotta, MD      .  divalproex (DEPAKOTE ER) 24 hr tablet 1,500 mg  1,500 mg Oral Daily Kerry HoughSpencer E Simon, PA-C   1,500 mg at 08/10/16 [id number]  . hydrOXYzine (ATARAX/VISTARIL) tablet 25 mg  25 mg Oral Q6H PRN Kerry HoughSpencer E Simon, PA-C      . ibuprofen (ADVIL,MOTRIN) tablet 800 mg  800 mg Oral Q8H PRN Kerry HoughSpencer E Simon, PA-C      . magnesium hydroxide (MILK OF MAGNESIA) suspension 30 mL  30 mL Oral Daily PRN Kerry HoughSpencer E Simon, PA-C      . mometasone-formoterol (DULERA) 200-5 MCG/ACT inhaler 2 puff  2 puff Inhalation BID  Kerry HoughSpencer E Simon, PA-C   2 puff at 08/10/16 [phone]  . OLANZapine (ZYPREXA) tablet 7.5 mg  7.5 mg Oral QHS Craige CottaFernando A Cobos, MD   7.5 mg at 08/09/16 2237    Lab Results:  Results for orders placed or performed during the hospital encounter of 08/07/16 (from the past 48 hour(s))  Hemoglobin A1c     Status: None   Collection Time: 08/09/16  6:08 AM  Result Value Ref Range   Hgb A1c MFr Bld 5.4 4.8 - 5.6 %    Comment: (NOTE)         Pre-diabetes: 5.7 - 6.4         Diabetes: >6.4         Glycemic control for adults with diabetes: <7.0    Mean Plasma Glucose 108 mg/dL    Comment: (NOTE) Performed At: [facility] [address] [place], [place] [id number] Mila HomerHancock William F MD YN:[phone] Performed at [facility], [address] [place], [place] [address]   Lipid panel     Status: Abnormal   Collection Time: 08/09/16  6:08 AM  Result Value Ref Range   Cholesterol 128 0 - 200 mg/dL   Triglycerides 97 <696<150 mg/dL   HDL 29 (L) >29>40 mg/dL   Total CHOL/HDL Ratio 4.4 RATIO   VLDL 19 0 - 40 mg/dL   LDL Cholesterol 80 0 - 99 mg/dL    Comment:        Total Cholesterol/HDL:CHD Risk Coronary Heart Disease Risk Table                     Men   Women  1/2 Average Risk   3.4   3.3  Average Risk       5.0   4.4  2 X Average Risk   9.6   7.1  3 X Average Risk  23.4   11.0        Use the calculated Patient Ratio above and the CHD Risk Table to determine the patient's CHD Risk.        ATP III CLASSIFICATION (LDL):  <100     mg/dL   Optimal  [phone]  mg/dL   Near or Above                    Optimal  130-159  mg/dL   Borderline  [phone]  mg/dL   High  >272>190     mg/dL   Very High Performed at [facility], [address] [place], [place] [address]   Prolactin     Status: Abnormal   Collection Time: 08/09/16  6:08 AM  Result Value Ref Range   Prolactin 35.9 (H) 4.0 - 15.2 ng/mL    Comment: (NOTE) Performed At: [facility] [address]  [place], [place] [id number] Mila HomerHancock William F MD DG:[phone] Performed  at [facility], [address] [place], [place] [address]   TSH     Status: None   Collection Time: 08/09/16  6:08 AM  Result Value Ref Range   TSH 0.668 0.350 - 4.500 uIU/mL    Comment: Performed by a 3rd Generation assay with a functional sensitivity of <=0.01 uIU/mL. Performed at [facility], [address] [place], [place] [address]     Blood Alcohol level:  Lab Results  Component Value Date   ETH <5 08/07/2016    Metabolic Disorder Labs: Lab Results  Component Value Date   HGBA1C 5.4 08/09/2016   MPG 108 08/09/2016   Lab Results  Component Value Date   PROLACTIN 35.9 (H) 08/09/2016   Lab Results  Component Value Date   CHOL 128 08/09/2016   TRIG 97 08/09/2016   HDL 29 (L) 08/09/2016   CHOLHDL 4.4 08/09/2016   VLDL 19 08/09/2016   LDLCALC 80 08/09/2016    Physical Findings: AIMS:  , ,  ,  ,    CIWA:    COWS:     Musculoskeletal: Strength & Muscle Tone: within normal limits Gait & Station: normal Patient leans: N/A  Psychiatric Specialty Exam: Physical Exam  Nursing note and vitals reviewed. Constitutional: He is oriented to person, place, and time. He appears well-developed.  Neurological: He is alert and oriented to person, place, and time.  Skin: Skin is warm and dry.  Psychiatric: He has a normal mood and affect.    Review of Systems  Psychiatric/Behavioral: Positive for depression. The patient is nervous/anxious.    no tremors, no abdominal pain, no nausea, no vomiting   Blood pressure 121/71, pulse 73, temperature 98.4 F (36.9 C), temperature source Oral, resp. rate 16, height 5\' 7"  (1.702 m), weight 95.3 kg (210 lb), SpO2 98 %.Body mass index is 32.89 kg/m.  General Appearance: Causal   Eye Contact:  Good  Speech:  Clear and Coherent  Volume:  Normal  Mood:  Depressed and Irritable  Affect:  Blunt, Congruent and Depressed   Thought Process:  Linear and Descriptions of Associations: Intact  Orientation:  Full (Time, Place, and Person)  Thought Content:  Hallucinations: None  Suicidal Thoughts:  No  Homicidal Thoughts:  No    Memory:  recent and remote grossly intact   Judgement:  Other:  fair, gradually improving   Insight:  Fair  Psychomotor Activity:  Normal  Concentration:  Concentration: Good and Attention Span: Good  Recall:  Good  Fund of Knowledge:  Good  Language:  Good  Akathisia:  No  Handed:  Right  AIMS (if indicated):     Assets:  Communication Skills Desire for Improvement Intimacy Resilience  ADL's:  improved  Cognition:  WNL  Sleep:  Number of Hours: 6.75     I agree with current treatment plan on 08/10/2016, Patient seen face-to-face for psychiatric evaluation follow-up, chart reviewed . Reviewed the information documented and agree with the treatment plan.  Treatment Plan Summary: Daily contact with patient to assess and evaluate symptoms and progress in treatment, Medication management, Plan inpatient treatment  and medications as below   Continue to encourage group and milieu participation to work on coping skills and symptom reduction  Will continue today 08/10/2016 plan as noted below expected where noted  Decreased Zyprexa at 7.5 mg to 5 mg PO QHS for mood disorder, explosiveness  -patient has complaint of excessive tiredness with this medication. Continue Depakote ER 1500 mgrs QHS  for mood disorder, intermittent explosiveness  Continue Vistaril 25 mgrs Q 6 hours PRN for anxiety Treatment team working on disposition planning options    Oneta Rack, NP 08/10/2016, 10:43 AM

## 2016-08-10 NOTE — Progress Notes (Signed)
Pt attend group. His day was a 7. His goal was to get his medicine correct a long day. He feel he came to correct place for help.

## 2016-08-10 NOTE — Progress Notes (Signed)
Patient ID: Jacob [name], male   DOB: 1985/11/01, 31 y.o.   MRN: [id number]  Pt currently presents with a flat affect and depressed, guarded behavior. Pt reports to writer that their goal is to "figure out what to take and how much to take (medications)." Pt states "I was tired today but it may be okay when I leave because I take Vyvance and use energy drinks in the morning at home to wake up." Pt reports good sleep with current medication regimen.   Pt provided with medications per providers orders. Pt's labs and vitals were monitored throughout the night. Pt given a 1:1 about emotional and mental status. Pt supported and encouraged to express concerns and questions. Pt educated on medications.  Pt's safety ensured with 15 minute and environmental checks. Pt currently denies SI/HI and A/V hallucinations. Pt verbally agrees to seek staff if SI/HI or A/VH occurs and to consult with staff before acting on any harmful thoughts. Pt requests materials on anger management. Reports periods of angry outbursts at home and at work, this is the first time patient has acknowledged this with Clinical research associatewriter. Will continue POC.

## 2016-08-10 NOTE — [facility] Group Notes (Signed)
Adult Psychoeducational Group Note  Date:  08/10/2016 Time:  0930-1100  Group Topic/Focus:  Assertive communication.  In this group we discussed the difference between passive, aggressive, passive-aggressive, and assertive communication styles.  The group was educated on effective "I" statements, and we covered a few different examples of "I" statements.  During the group people were free to discuss their personal struggles with communication and relationships.  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  He was very drowsy and would fall asleep intermittently.  Lindajo RoyalDaniel P Raziya Aveni 08/10/2016, 12:28 PM

## 2016-08-11 NOTE — Progress Notes (Signed)
Jacob [name] is seen OOB UAL on the 400 all today..he tolerates this well.  He is pleasant and cooperative during his med  pass, he tries not to engage in conversation ( again this morning ) but he willingly completes his daily assessment and on it he wrote he deneid SI today  And he rated his depression, hopelessness and anxeity " 0/0/4", respectively. [name] is compliant with medications and he says " I hope I am getting close to being dc'Jacob..ibuprofen'm hoping I can be discahrged tomorrow.Marland Kitchen..Marland Kitchen

## 2016-08-11 NOTE — Progress Notes (Signed)
Patient ID: Jacob [name], male   DOB: 1986-03-29, 31 y.o.   MRN: [id number]  Pt currently presents with an appropriate affect and depressed behavior. Pt reports to writer that their goal is to "focus on relaxation." Pt reports good sleep with current medication regimen. Pt reports that he has been feeling less anxious today and also has not felt as tired as he was yesterday.  Pt provided with medications per providers orders. Pt's labs and vitals were monitored throughout the night. Pt given a 1:1 about emotional and mental status. Pt supported and encouraged to express concerns and questions. Pt educated on medications.  Pt's safety ensured with 15 minute and environmental checks. Pt currently denies SI/HI and A/V hallucinations. Pt verbally agrees to seek staff if SI/HI or A/VH occurs and to consult with staff before acting on any harmful thoughts. Will continue POC.

## 2016-08-11 NOTE — [facility] Group Notes (Signed)
[facility] Group Notes:  (Clinical Social Work)   08/11/2016    10:00-11:00AM  Summary of Progress/Problems:   The main focus of today's process group was to   1)  discuss what healthy supports may be helpful to maintaining their wellness upon hospital discharge;  2)  go over a variety of potential healthy supports that could be considered  3)  identify the patient's willilngness to consider adding various types of support.  An exercise was done in which Motivational Interviewing techniques were utilized to assist patients in thinking about their willingness to consider various supports, as well as the barriers that they may encounter in adding each type.  An emphasis was placed on using counselor, psychiatrist, therapy groups, 12-step groups, and gym/workout partner,, volunteer work, take someone to appointments, scheduling time with family/friends, and problem-specific support groups to expand healthy supports.    The patient expressed full comprehension of the concepts presented, and agreed that there is a need to add more supports.  The patient stated that he feels that one type of support that would be very helpful to him would be peers who are non-judgmental as he has experienced in the hospital.  He said this has been the most helpful part of his hospitalization.  He also indicated a willingness to participate in a variety of other kinds of support, and wanted to be willing to participate in some others that he thought might be challenging for him only because of his anxiety.  Type of Therapy:  Process Group with Motivational Interviewing  Participation Level:  Active  Participation Quality:  Attentive, Sharing and Supportive  Affect:  Appropriate  Cognitive:  Appropriate and Oriented  Insight:  Engaged  Engagement in Therapy:  Engaged  Modes of Intervention:   Education, Support and Processing  Ambrose MantleMareida Grossman-Orr, LCSW 08/11/2016    12:50 PM

## 2016-08-11 NOTE — Progress Notes (Signed)
[facility] MD Progress Note  08/11/2016 11:30 AM Jacob [name]  MRN:  [id number] Subjective:Patient reports " I feel better after the medication adjustment. States 6:00am is to early to wake-up for breakfast."    Objective : Jacob [name] is awake, alert and oriented *3. Seen resting in bedroom.   Denies auditory or visual hallucination and does not appear to be responding to internal stimuli. Denies suicidal or homicidal ideation during this assessment.  Patient reports he is medication compliant and states he is tolerating medications well. Reports he feels the Zyprexa is helping. States his depression 2/10 during this assessment. Reports fair appetite and reports resting well  Support, encouragement and reassurance was provided.    Principal Problem: Bipolar 2 disorder (HCC) Diagnosis:   Patient Active Problem List   Diagnosis Date Noted  . Intermittent explosive disorder in adult [F63.81]   . Bipolar II disorder (HCC) [F31.81] 08/07/2016  . Bipolar 2 disorder (HCC) [F31.81] 08/07/2016  . Mood disorder (HCC) [F39] 04/30/2016  . Moderate episode of recurrent major depressive disorder (HCC) [F33.1] 04/29/2016  . Asthma with acute exacerbation [J45.901] 04/09/2016  . Allergic rhinitis [J30.9] 04/09/2016  . Routine general medical examination at a health care facility [Z00.00] 10/26/2014  . Right shoulder pain [M25.511] 07/07/2014  . Bipolar 1 disorder, mixed, moderate (HCC) [F31.62] 05/05/2014  . ADHD (attention deficit hyperactivity disorder) [F90.9] 05/05/2014   Total Time spent with patient: 20 minutes  Past Medical History:  Past Medical History:  Diagnosis Date  . ADHD (attention deficit hyperactivity disorder) evaluation   . Allergy   . Bipolar disorder (HCC)    History reviewed. No pertinent surgical history. Family History:  Family History  Problem Relation Age of Onset  . Cancer Mother    Social History:  History  Alcohol Use No    Comment: Rare     History  Drug Use  No    Social History   Social History  . Marital status: Married    Spouse name: N/A  . Number of children: 0  . 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 Smoking status: Never Smoker  . Smokeless tobacco: Never Used  . Alcohol use No     Comment: Rare  . Drug use: No  . Sexual activity: Yes    Partners: Female    Birth control/ protection: Condom, Diaphragm   Other Topics Concern  . None   Social History Narrative   Born and raised in [place]. Currently live in private residence with wife [name]). 3 cats, 2 dogs.   Fun: Engineer, petroleum   Denies religious beliefs that would effect healthcare.    Additional Social History:   Sleep: Fair  Appetite:  Good  Current Medications: Current Facility-Administered Medications  Medication Dose Route Frequency Provider Last Rate Last Dose  . acetaminophen (TYLENOL) tablet 650 mg  650 mg Oral Q6H PRN Kerry Hough, PA-C      . albuterol (PROVENTIL HFA;VENTOLIN HFA) 108 (90 Base) MCG/ACT inhaler 2 puff  2 puff Inhalation Q6H PRN Kerry Hough, PA-C      . alum & mag hydroxide-simeth (MAALOX/MYLANTA) 200-200-20 MG/5ML suspension 30 mL  30 mL Oral Q4H PRN Kerry Hough, PA-C      . cyclobenzaprine (FLEXERIL) tablet 10 mg  10 mg Oral TID PRN Craige Cotta, MD      . divalproex (DEPAKOTE ER) 24 hr tablet 1,500  mg  1,500 mg Oral Daily Kerry HoughSpencer E Simon, PA-C   1,500 mg at 08/11/16 0800  . hydrOXYzine (ATARAX/VISTARIL) tablet 25 mg  25 mg Oral Q6H PRN Kerry HoughSpencer E Simon, PA-C   25 mg at 08/10/16 2245  . ibuprofen (ADVIL,MOTRIN) tablet 800 mg  800 mg Oral Q8H PRN Kerry HoughSpencer E Simon, PA-C      . magnesium hydroxide (MILK OF MAGNESIA) suspension 30 mL  30 mL Oral Daily PRN Kerry HoughSpencer E Simon, PA-C      . mometasone-formoterol (DULERA) 200-5 MCG/ACT inhaler 2 puff  2 puff Inhalation BID Kerry HoughSpencer E Simon, PA-C   2 puff at 08/11/16 0800  . OLANZapine (ZYPREXA) tablet  5 mg  5 mg Oral QHS Oneta Rackanika N Ferrell Flam, NP   5 mg at 08/10/16 2245    Lab Results:  No results found for this or any previous visit (from the past 48 hour(s)).  Blood Alcohol level:  Lab Results  Component Value Date   ETH <5 08/07/2016    Metabolic Disorder Labs: Lab Results  Component Value Date   HGBA1C 5.4 08/09/2016   MPG 108 08/09/2016   Lab Results  Component Value Date   PROLACTIN 35.9 (H) 08/09/2016   Lab Results  Component Value Date   CHOL 128 08/09/2016   TRIG 97 08/09/2016   HDL 29 (L) 08/09/2016   CHOLHDL 4.4 08/09/2016   VLDL 19 08/09/2016   LDLCALC 80 08/09/2016    Physical Findings: AIMS:  , ,  ,  ,    CIWA:    COWS:     Musculoskeletal: Strength & Muscle Tone: within normal limits Gait & Station: normal Patient leans: N/A  Psychiatric Specialty Exam: Physical Exam  Nursing note and vitals reviewed. Constitutional: He is oriented to person, place, and time. He appears well-developed.  Neurological: He is alert and oriented to person, place, and time.  Skin: Skin is warm and dry.  Psychiatric: He has a normal mood and affect.    Review of Systems  Psychiatric/Behavioral: Positive for depression (stable). The patient is nervous/anxious.    no tremors, no abdominal pain, no nausea, no vomiting   Blood pressure 137/71, pulse 82, temperature 97.9 F (36.6 C), resp. rate 18, height 5\' 7"  (1.702 m), weight 95.3 kg (210 lb), SpO2 98 %.Body mass index is 32.89 kg/m.  General Appearance: Causal, fairly groomed  Eye Contact:  Good  Speech:  Clear and Coherent  Volume:  Normal  Mood:  Depressed  Affect:  Blunt, Congruent and Depressed  Thought Process:  Goal Directed  Orientation:  Full (Time, Place, and Person)  Thought Content:  Hallucinations: None  Suicidal Thoughts:  No  Homicidal Thoughts:  No    Memory:  Immediate;   Fair Recent;   Fair Remote;   Fair  Judgement:  Intact  Insight:  Fair  Psychomotor Activity:  Normal  Concentration:   Concentration: Fair  Recall:  Good  Fund of Knowledge:  Good  Language:  Good  Akathisia:  No  Handed:  Right  AIMS (if indicated):     Assets:  Communication Skills Desire for Improvement Intimacy Resilience Social Support  ADL's:  improved  Cognition:  WNL  Sleep:  Number of Hours: 5     I agree with current treatment plan on 08/11/2016, Patient seen face-to-face for psychiatric evaluation follow-up, chart reviewed . Reviewed the information documented and agree with the treatment plan.  Treatment Plan Summary: Daily contact with patient to assess and evaluate symptoms and progress  in treatment, Medication management, Plan inpatient treatment  and medications as below   Continue to encourage group and milieu participation to work on coping skills and symptom reduction  Will continue today 08/11/2016 plan as noted below expected where noted  Continue  Zyprexa  5 mg PO QHS for mood disorder, explosiveness  -patient has complaint of excessive tiredness with this medication. Continue Depakote ER 1500 mg QHS for mood disorder, intermittent explosiveness  Continue Vistaril 25 mg Q 6 hours PRN for anxiety Treatment team working on disposition planning options   Oneta Rack, NP 08/11/2016, 11:30 AM

## 2016-08-12 MED ORDER — MOMETASONE FURO-FORMOTEROL FUM 200-5 MCG/ACT IN AERO: 2.0000 | INHALATION_SPRAY | Freq: Two times a day (BID) | RESPIRATORY_TRACT | 1 refills | Status: DC

## 2016-08-12 MED ORDER — HYDROXYZINE HCL 25 MG PO TABS: 25.0000 mg | ORAL_TABLET | Freq: Four times a day (QID) | ORAL | 0 refills | Status: DC | PRN

## 2016-08-12 MED ORDER — CYCLOBENZAPRINE HCL 10 MG PO TABS: 10.0000 mg | ORAL_TABLET | Freq: Three times a day (TID) | ORAL | 0 refills | Status: DC | PRN

## 2016-08-12 MED ORDER — OLANZAPINE 5 MG PO TABS: 5.0000 mg | ORAL_TABLET | Freq: Every day | ORAL | 0 refills | Status: DC

## 2016-08-12 MED ORDER — DIVALPROEX SODIUM ER 500 MG PO TB24: 1500.0000 mg | ORAL_TABLET | Freq: Every day | ORAL | 0 refills | Status: DC

## 2016-08-12 NOTE — Progress Notes (Signed)
D: Pt presents with a flat affect. Pt guarded and forwarded little information during shift assessment. Pt denies depression. Pt denies suicidal/homicidal thoughts. Pt stated that he's here in the hospital because he had a "bipolar episode". Pt stated that he feels as though his mood is stabilized. Pt denies any recent anger episodes. Pt c/o bilat knee pain. Pt given prn med at his request for discomfort.  A: Medications reviewed with pt. Medications administered as ordered per MD. Verbal support provided. Pt encouraged to attend groups. 15 minute checks performed for safety. R: Pt receptive to tx.
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
## 2016-08-12 NOTE — [facility] Group Notes (Signed)
[facility] LCSW Group Therapy  08/12/2016 1:15pm  Type of Therapy:  Group Therapy vercoming Obstacles  Participation Level:  Minimal  Participation Quality:  Appropriate   Affect:  Lethargic  Cognitive:  Appropriate and Oriented  Insight:  Developing  Engagement in Therapy:  Improving  Modes of Intervention:  Discussion, Exploration, Problem-solving and Support  Description of Group:   In this group patients will be encouraged to explore what they see as obstacles to their own wellness and recovery. They will be guided to discuss their thoughts, feelings, and behaviors related to these obstacles. The group will process together ways to cope with barriers, with attention given to specific choices patients can make. Each patient will be challenged to identify changes they are motivated to make in order to overcome their obstacles. This group will be process-oriented, with patients participating in exploration of their own experiences as well as giving and receiving support and challenge from other group members.  Summary of Patient Progress: Pt slept throughout most of the group discussion; however, he did identify his anxiety as an obstacle in his life.    Therapeutic Modalities:   Cognitive Behavioral Therapy Solution Focused Therapy Motivational Interviewing Relapse Prevention Therapy   Jacob ShanksLauren Janellie Tennison, LCSW 08/12/2016 3:12 PM

## 2016-08-12 NOTE — Progress Notes (Signed)
[facility] MD Progress Note  08/12/2016 12:43 PM Jacob [name]  MRN:  [id number] Subjective: Patient states he is feeling "OK". He denies medication side effects. He denies any current worsening anger or recent episodes of explosiveness . Thus far tolerating medications well, does not endorse medication side effects. States he has had visits from his wife which have been " all right", without any increased irritability . He expresses interest in a family meeting with his wife prior to discharge, which she has also requested.   Objective : I have discussed case with treatment team and have met with patient . Presents calm, not irritable or agitated, reports partially improved mood. As he improves he is focusing more on discharge planning, at this time plans to return home. As noted, states that his wife has come to visit him on unit, and that visits have gone well . Denies any violent or homicidal ideations towards wife or anyone else . At this time tolerating medications well, denies side effects. Has minimized depression, sadness, but does endorse some ongoing anxiety. No disruptive or agitated behaviors on unit .    Principal Problem: Bipolar 2 disorder (Jacob [name]) Diagnosis:   Patient Active Problem List   Diagnosis Date Noted  . Intermittent explosive disorder in adult [F63.81]   . Bipolar II disorder (Jacob [name]) [F31.81] 08/07/2016  . Bipolar 2 disorder (Jacob [name]) [F31.81] 08/07/2016  . Mood disorder (Jacob [name]) [F39] 04/30/2016  . Moderate episode of recurrent major depressive disorder (Jacob [name]) [F33.1] 04/29/2016  . Asthma with acute exacerbation [J45.901] 04/09/2016  . Allergic rhinitis [J30.9] 04/09/2016  . Routine general medical examination at a health care facility [Z00.00] 10/26/2014  . Right shoulder pain [M25.511] 07/07/2014  . Bipolar 1 disorder, mixed, moderate (Jacob [name]) [F31.62] 05/05/2014  . ADHD (attention deficit hyperactivity disorder) [F90.9] 05/05/2014   Total Time spent with patient: 20  minutes  Past Medical History:  Past Medical History:  Diagnosis Date  . ADHD (attention deficit hyperactivity disorder) evaluation   . Allergy   . Bipolar disorder (Jacob [name])    History reviewed. No pertinent surgical history. Family History:  Family History  Problem Relation Age of Onset  . Cancer Mother    Social History:  History  Alcohol Use No    Comment: Rare     History  Drug Use No    Social History   Social History  . Marital status: Married    Spouse name: N/A  . Number of children: 0  . Years of education: 12   Occupational History  . Asst Manager [employer]   Social History Main Topics  . Smoking status: Never Smoker  . Smokeless tobacco: Never Used  . Alcohol use No     Comment: Rare  . Drug use: No  . Sexual activity: Yes    Partners: Female    Birth control/ protection: Condom, Diaphragm   Other Topics Concern  . None   Social History Narrative   Born and raised in [place]. Currently live in private residence with wife Sharyn Lull). 3 cats, 2 dogs.   Fun: Estate agent   Denies religious beliefs that would effect healthcare.    Additional Social History:   Sleep: improving  Appetite:  Good  Current Medications: Current Facility-Administered Medications  Medication Dose Route Frequency Provider Last Rate Last Dose  . acetaminophen (TYLENOL) tablet 650 mg  650 mg Oral Q6H PRN Laverle Hobby, PA-C      . albuterol (PROVENTIL HFA;VENTOLIN HFA) 108 (90 Base) MCG/ACT inhaler 2  puff  2 puff Inhalation Q6H PRN Laverle Hobby, PA-C      . alum & mag hydroxide-simeth (MAALOX/MYLANTA) 200-200-20 MG/5ML suspension 30 mL  30 mL Oral Q4H PRN Laverle Hobby, PA-C      . cyclobenzaprine (FLEXERIL) tablet 10 mg  10 mg Oral TID PRN Jenne Campus, MD   10 mg at 08/12/16 0829  . divalproex (DEPAKOTE ER) 24 hr tablet 1,500 mg  1,500 mg Oral Daily Laverle Hobby, PA-C   1,500 mg at 08/12/16 0827  . hydrOXYzine (ATARAX/VISTARIL) tablet 25 mg   25 mg Oral Q6H PRN Laverle Hobby, PA-C   25 mg at 08/11/16 2304  . ibuprofen (ADVIL,MOTRIN) tablet 800 mg  800 mg Oral Q8H PRN Laverle Hobby, PA-C   800 mg at 08/12/16 3007  . magnesium hydroxide (MILK OF MAGNESIA) suspension 30 mL  30 mL Oral Daily PRN Laverle Hobby, PA-C      . mometasone-formoterol [facility]) 200-5 MCG/ACT inhaler 2 puff  2 puff Inhalation BID Laverle Hobby, PA-C   2 puff at 08/11/16 2303  . OLANZapine (ZYPREXA) tablet 5 mg  5 mg Oral QHS Derrill Center, NP   5 mg at 08/11/16 2304    Lab Results:  No results found for this or any previous visit (from the past 48 hour(s)).  Blood Alcohol level:  Lab Results  Component Value Date   ETH <5 62/26/3335    Metabolic Disorder Labs: Lab Results  Component Value Date   HGBA1C 5.4 08/09/2016   MPG 108 08/09/2016   Lab Results  Component Value Date   PROLACTIN 35.9 (H) 08/09/2016   Lab Results  Component Value Date   CHOL 128 08/09/2016   TRIG 97 08/09/2016   HDL 29 (L) 08/09/2016   CHOLHDL 4.4 08/09/2016   VLDL 19 08/09/2016   LDLCALC 80 08/09/2016    Physical Findings: AIMS:  , ,  ,  ,    CIWA:    COWS:     Musculoskeletal: Strength & Muscle Tone: within normal limits Gait & Station: normal Patient leans: N/A  Psychiatric Specialty Exam: Physical Exam  Nursing note and vitals reviewed. Constitutional: He is oriented to person, place, and time. He appears well-developed.  Neurological: He is alert and oriented to person, place, and time.  Skin: Skin is warm and dry.  Psychiatric: He has a normal mood and affect.    Review of Systems  Psychiatric/Behavioral: Positive for depression (stable). The patient is nervous/anxious.    no tremors, no abdominal pain, no nausea, no vomiting   Blood pressure 135/83, pulse 83, temperature 97.6 F (36.4 C), temperature source Oral, resp. rate 18, height _0  (1.702 m), weight 95.3 kg (210 lb), SpO2 98 %.Body mass index is 32.89 kg/m.  General  Appearance:fairly groomed   Eye Contact:  Good  Speech:  Normal Rate  Volume:  Normal  Mood:  States mood is "OK", denies depression  Affect:  Reactive  Thought Process: Linear   Orientation:  Full (Time, Place, and Person)  Thought Content:  Hallucinations: None, no delusions expressed, not internally preoccupied   Suicidal Thoughts:  No denies any suicidal or self injurious ideations, denies any homicidal or violent ideations,and specifically denies any homicidal ideations or violent ideations towards wife   Homicidal Thoughts:  No    Memory: recent and remote grossly intact   Judgement: fair-improving   Insight:  Fair- improving   Psychomotor Activity:  Normal  Concentration:  Concentration: Good and Attention Span: Good  Recall:  Good  Fund of Knowledge:  Good  Language:  Good  Akathisia:  No  Handed:  Right  AIMS (if indicated):     Assets:  Communication Skills Desire for Improvement Intimacy Resilience Social Support  ADL's:  improved  Cognition:  WNL  Sleep:  Number of Hours: 5    Assessment - patient is presenting with improvement compared to admission- at this time calm, cooperative on approach, no irritable or explosive outbursts, states visits from wife have gone well . Denies medication side effects. ( on Depakote and Zyprexa) . No SI , no HI. Working non disposition planning, interested in family meeting .  Treatment Plan Summary: Daily contact with patient to assess and evaluate symptoms and progress in treatment, Medication management, Plan inpatient treatment  and medications as below  Encourage increased milieu and group participation to work on coping skills and symptom reduction Continue  Zyprexa  5 mg PO QHS for mood disorder, explosiveness  Continue Depakote ER 1500 mg QHS for mood disorder, intermittent explosiveness  Continue Vistaril 25 mg Q 6 hours PRN for anxiety Treatment team working on disposition planning options - family meeting with wife  requested by patient and wife- will work on arranging for today or tomorrow  Neita Garnet, MD 08/12/2016, 12:43 PM   Patient ID: Jacob [name], male   DOB: [birth year], 31 y.o.   MRN: [id number]

## 2016-08-12 NOTE — Progress Notes (Signed)
Adult Psychoeducational Group Note  Date:  08/12/2016 Time:  9:18 PM  Group Topic/Focus:  Wrap-Up Group:   The focus of this group is to help patients review their daily goal of treatment and discuss progress on daily workbooks.  Participation Level:  Active  Participation Quality:  Appropriate  Affect:  Appropriate  Cognitive:  Alert  Insight: Appropriate  Engagement in Group:  Engaged  Modes of Intervention:  Discussion  Additional Comments:  Patient rated his day a 7. Patient stated the family meeting with the doctor went well.   Cire Deyarmin L Kartik Fernando 08/12/2016, 9:18 PM

## 2016-08-12 NOTE — Progress Notes (Signed)
Recreation Therapy Notes  Date: 08/12/16 Time: 0930 Location: [address] Dayroom  Group Topic: Stress Management  Goal Area(s) Addresses:  Patient will verbalize importance of using healthy stress management.  Patient will identify positive emotions associated with healthy stress management.   Intervention: Stress Management  Activity :  Meditation.  LRT introduced the stress management technique of meditation.  LRT played a meditation from the Calm app to give patients the opportunity to engage in the activity.  Patients were to follow along as LRT played the meditation.  Education:  Stress Management, Discharge Planning.   Education Outcome: Acknowledges edcuation/In group clarification offered/Needs additional education  Clinical Observations/Feedback: Pt did not attend group.    Caroll RancherMarjette Maeve Debord, LRT/CTRS         Caroll RancherLindsay, Kenyada Dosch A 08/12/2016 3:23 PM

## 2016-08-13 NOTE — [facility] Suicide Risk Assessment (Signed)
[facility] Discharge Suicide Risk Assessment   Principal Problem: Bipolar 2 disorder [facility]) Discharge Diagnoses:  Patient Active Problem List   Diagnosis Date Noted  . Intermittent explosive disorder in adult [F63.81]   . Bipolar II disorder (HCC) [F31.81] 08/07/2016  . Bipolar 2 disorder (HCC) [F31.81] 08/07/2016  . Mood disorder (HCC) [F39] 04/30/2016  . Moderate episode of recurrent major depressive disorder (HCC) [F33.1] 04/29/2016  . Asthma with acute exacerbation [J45.901] 04/09/2016  . Allergic rhinitis [J30.9] 04/09/2016  . Routine general medical examination at a health care facility [Z00.00] 10/26/2014  . Right shoulder pain [M25.511] 07/07/2014  . Bipolar 1 disorder, mixed, moderate (HCC) [F31.62] 05/05/2014  . ADHD (attention deficit hyperactivity disorder) [F90.9] 05/05/2014    Total Time spent with patient: 30 minutes  Musculoskeletal: Strength & Muscle Tone: within normal limits Gait & Station: normal Patient leans: N/A  Psychiatric Specialty Exam: ROS no headache, no chest pain, no nausea, no vomiting, no fever, no chills, lower back pain  Blood pressure 127/85, pulse 85, temperature 98 F (36.7 C), temperature source Oral, resp. rate 16, height 5\' 7"  (1.702 m), weight 95.3 kg (210 lb), SpO2 98 %.Body mass index is 32.89 kg/m.  General Appearance: Well Groomed  Eye Contact::  Good  Speech:  Normal Rate409  Volume:  Normal  Mood:  improved  Affect:  improved, fuller range of affect   Thought Process:  Linear  Orientation:  Full (Time, Place, and Person)  Thought Content:  no hallucinations, no delusions, not internally preoccuopied  Suicidal Thoughts:  No denies any suicidal or self injurious ideations, denies any homicidal or violent ideations, and specifically also denies any homicidal or violent ideations towards wife   Homicidal Thoughts:  No  Memory:  recent and remote grossly intact   Judgement:  Other:  improved  Insight:  improving   Psychomotor Activity:   Normal  Concentration:  Good  Recall:  Good  Fund of Knowledge:Good  Language: Good  Akathisia:  Negative  Handed:  Right  AIMS (if indicated):   no abnormal or involuntary movements noted or reported   Assets:  Communication Skills Desire for Improvement Resilience  Sleep:  Number of Hours: 6.75  Cognition: WNL  ADL's:  Intact   Mental Status Per Nursing Assessment::   On Admission:     Demographic Factors:  31 year old married male, lives with wife, one daughter, employed   Loss Factors: Marital conflict   Historical Factors: Has been diagnosed with Bipolar Disorder in the past, describes history of Intermittent Explosive Disorder. Has been treated with Depakote ER and more recently with Zyprexa .  Risk Reduction Factors:   Responsible for children under 92 years of age, Sense of responsibility to family, Employed, Living with another person, especially a relative and Positive coping skills or problem solving skills  Continued Clinical Symptoms:  At this time patient is alert, attentive, well related, calm, not irritable, not restless, mood described as improved, denies depression, affect appropriate, more reactive, not irritable or expansive, no thought disorder, no suicidal or self injurious ideations, no homicidal ideations, no violent or homicidal ideations towards wife, no hallucinations, no delusions. Future oriented, looking forward to seeing his child and returning to work soon.  Behavior on unit in good control, calm. Denies medication side effects- we have reviewed side effect profile, to include risk of tremors, GI symptoms,hair loss on Valproic Acid and of weight gain, metabolic disturbances, sedation, movement disorders on Olanzapine  Yesterday had family meeting with patient, wife,  CSW, Clinical research associatewriter- meeting went well, they reiterated support for eachother, desire to work on marriage, and wife corroborated patient much improved and agreeing to his being discharged today.  Strategies to improve marital communication discussed and reviewed .  Cognitive Features That Contribute To Risk:  No gross cognitive deficits noted upon discharge. Is alert , attentive, and oriented x 3   Suicide Risk:  Mild:  Suicidal ideation of limited frequency, intensity, duration, and specificity.  There are no identifiable plans, no associated intent, mild dysphoria and related symptoms, good self-control (both objective and subjective assessment), few other risk factors, and identifiable protective factors, including available and accessible social support.  Follow-up Information    [facility] Follow up on 09/04/2016.   Specialty:  Behavioral Health Why:  at 4:30pm for medication management with Dr. Fransico HimHisada Contact information: [address] [place] [id number] [phone]       Frankey ShownRobert Milan,LCSW Follow up on 08/15/2016.   Why:  at 4:00pm for your first therapy appointment.  Contact information: [address], [place], [place] [address] Main Phone: [phone](336) [phone] Fax: [phone]          Plan Of Care/Follow-up recommendations:  Activity:  as tolerated  Diet:  Regular Tests:  NA Other:  See below  Patient is leaving unit in good spirits Plans to return home Plans to follow up as above  Plans to start individual therapy and also expresses interest in starting couples therapy, which has been recommended   Nehemiah MassedOBOS, FERNANDO, MD 08/13/2016, 9:27 AM

## 2016-08-13 NOTE — Progress Notes (Signed)
  [facility] :  Will you be returning to the same living situation after discharge:  Yes,  Pt returning home At discharge, do you have transportation home?: Yes,  [facility] Do you have the ability to pay for your medications: Yes,  Pt provided with prescriptions  Release of information consent forms completed and in the chart;  Patient's signature needed at discharge.  Patient to Follow up at: Follow-up Information    [facility] Follow up on 09/04/2016.   Specialty:  Behavioral Health Why:  at 4:30pm for medication management with Dr. Fransico HimHisada Contact information: [address] [place] [id number] [phone]       Frankey ShownRobert Milan,LCSW Follow up on 08/15/2016.   Why:  at 4:00pm for your first therapy appointment.  Contact information: [address], [place], [place] [address] Main Phone: [phone] Fax: [phone]) [phone]          Next level of care provider has [facility] Link:no  Safety Planning and Suicide Prevention discussed: Yes,  with wife; see SPE note  Have you used any form of tobacco in the last 30 days? (Cigarettes, Smokeless Tobacco, Cigars, and/or Pipes): No  Has patient been referred to the Quitline?: N/A patient is not a smoker  Patient has been referred for addiction treatment: N/A  Verdene LennertLauren C Akosua Constantine 08/13/2016, 9:44 AM

## 2016-08-13 NOTE — Plan of Care (Signed)
Problem: Health Behavior/Discharge Planning: Goal: Compliance with prescribed medication regimen will improve Outcome: Progressing Pt has been compliant with medication regimen tonight.    

## 2016-08-13 NOTE — Tx Team (Signed)
Interdisciplinary Treatment and Diagnostic Plan Update  08/13/2016 Time of Session: 9:42 AM  Jacob [name] MRN: [id number]  Principal Diagnosis: Bipolar 2 disorder [facility])  Secondary Diagnoses: Principal Problem:   Bipolar 2 disorder (HCC) Active Problems:   ADHD (attention deficit hyperactivity disorder)   Moderate episode of recurrent major depressive disorder (HCC)   Intermittent explosive disorder in adult   Current Medications:  Current Facility-Administered Medications  Medication Dose Route Frequency Provider Last Rate Last Dose  . acetaminophen (TYLENOL) tablet 650 mg  650 mg Oral Q6H PRN Kerry Hough, PA-C   650 mg at 08/13/16 0802  . albuterol (PROVENTIL HFA;VENTOLIN HFA) 108 (90 Base) MCG/ACT inhaler 2 puff  2 puff Inhalation Q6H PRN Kerry Hough, PA-C      . alum & mag hydroxide-simeth (MAALOX/MYLANTA) 200-200-20 MG/5ML suspension 30 mL  30 mL Oral Q4H PRN Kerry Hough, PA-C      . cyclobenzaprine (FLEXERIL) tablet 10 mg  10 mg Oral TID PRN Craige Cotta, MD   10 mg at 08/13/16 0802  . divalproex (DEPAKOTE ER) 24 hr tablet 1,500 mg  1,500 mg Oral Daily Kerry Hough, PA-C   1,500 mg at 08/13/16 0800  . hydrOXYzine (ATARAX/VISTARIL) tablet 25 mg  25 mg Oral Q6H PRN Kerry Hough, PA-C   25 mg at 08/12/16 2203  . ibuprofen (ADVIL,MOTRIN) tablet 800 mg  800 mg Oral Q8H PRN Kerry Hough, PA-C   800 mg at 08/12/16 1716  . magnesium hydroxide (MILK OF MAGNESIA) suspension 30 mL  30 mL Oral Daily PRN Kerry Hough, PA-C      . mometasone-formoterol [facility]) 200-5 MCG/ACT inhaler 2 puff  2 puff Inhalation BID Kerry Hough, PA-C   2 puff at 08/13/16 0803  . OLANZapine (ZYPREXA) tablet 5 mg  5 mg Oral QHS Oneta Rack, NP   5 mg at 08/12/16 2203    PTA Medications: Prescriptions Prior to Admission  Medication Sig Dispense Refill Last Dose  . albuterol (PROVENTIL HFA;VENTOLIN HFA) 108 (90 Base) MCG/ACT inhaler Inhale 2 puffs into the lungs every 6 (six) hours  as needed for wheezing or shortness of breath. 1 Inhaler 2 08/06/2016 at Unknown time  . budesonide-formoterol (SYMBICORT) 160-4.5 MCG/ACT inhaler Inhale 2 puffs into the lungs 2 (two) times daily. (Patient taking differently: Inhale 2 puffs into the lungs daily. ) 1 Inhaler 11 08/06/2016 at Unknown time  . cyclobenzaprine (FLEXERIL) 5 MG tablet Take 1-2 tablets (5-10 mg total) by mouth 3 (three) times daily as needed for muscle spasms. 30 tablet 1 Past Month at Unknown time  . divalproex (DEPAKOTE ER) 250 MG 24 hr tablet Take 250 mg by mouth daily. Once daily with ER 500 to make 1750 daily   08/07/2016 at Unknown time  . divalproex (DEPAKOTE ER) 500 MG 24 hr tablet Take 3 tablets (1,500 mg total) by mouth daily. 270 tablet 0 08/07/2016 at Unknown time  . escitalopram (LEXAPRO) 20 MG tablet Take 1 tablet (20 mg total) by mouth daily. 30 tablet 1 08/07/2016 at Unknown time  . ibuprofen (ADVIL,MOTRIN) 800 MG tablet Take 1 tablet (800 mg total) by mouth every 8 (eight) hours as needed. 90 tablet 0 Past Month at Unknown time  . levocetirizine (XYZAL) 5 MG tablet Take 5 mg by mouth every evening.   08/07/2016 at Unknown time  . lisdexamfetamine (VYVANSE) 70 MG capsule Take 1 capsule (70 mg total) by mouth daily. 90 capsule 0 08/07/2016 at Unknown time  .  meloxicam (MOBIC) 7.5 MG tablet Take 1-2 tablets (7.5-15 mg total) by mouth daily. (Patient taking differently: Take 7.5-15 mg by mouth daily as needed for pain. ) 30 tablet 0 Past Month at Unknown time  . OLANZapine (ZYPREXA) 2.5 MG tablet Take 1 tablet (2.5 mg total) by mouth at bedtime as needed (irritability, agitation). 30 tablet 1 Past Week at Unknown time  . pseudoephedrine (SUDAFED) 30 MG tablet Take 30 mg by mouth every 4 (four) hours as needed for congestion.   Past Week at Unknown time  . divalproex (DEPAKOTE ER) 500 MG 24 hr tablet Take 1 tablet (500 mg total) by mouth daily. Take total of 1750 mg daily (Patient not taking: Reported on 08/07/2016) 90  tablet 1 Not Taking at Unknown time  . valproic acid (DEPAKENE) 250 MG capsule Take total of 1750 mg (Patient not taking: Reported on 08/07/2016) 30 capsule 1 Not Taking at Unknown time    Treatment Modalities: Medication Management, Group therapy, Case management,  1 to 1 session with clinician, Psychoeducation, Recreational therapy.  Patient Stressors: Marital or family conflict Medication change or noncompliance  Patient Strengths: Capable of independent living Wellsite geologistCommunication skills General fund of knowledge Motivation for treatment/growth Supportive family/friends  Physician Treatment Plan for Primary Diagnosis: Bipolar 2 disorder (HCC) Long Term Goal(s): Improvement in symptoms so as ready for discharge  Short Term Goals: Ability to verbalize feelings will improve Ability to disclose and discuss suicidal ideas Ability to demonstrate self-control will improve Ability to identify and develop effective coping behaviors will improve Ability to maintain clinical measurements within normal limits will improve Ability to verbalize feelings will improve Ability to disclose and discuss suicidal ideas Ability to demonstrate self-control will improve Ability to identify and develop effective coping behaviors will improve Ability to maintain clinical measurements within normal limits will improve  Medication Management: Evaluate patient's response, side effects, and tolerance of medication regimen.  Therapeutic Interventions: 1 to 1 sessions, Unit Group sessions and Medication administration.  Evaluation of Outcomes: Adequate for Discharge  Physician Treatment Plan for Secondary Diagnosis: Principal Problem:   Bipolar 2 disorder (HCC) Active Problems:   ADHD (attention deficit hyperactivity disorder)   Moderate episode of recurrent major depressive disorder (HCC)   Intermittent explosive disorder in adult   Long Term Goal(s): Improvement in symptoms so as ready for  discharge  Short Term Goals: Ability to verbalize feelings will improve Ability to disclose and discuss suicidal ideas Ability to demonstrate self-control will improve Ability to identify and develop effective coping behaviors will improve Ability to maintain clinical measurements within normal limits will improve Ability to verbalize feelings will improve Ability to disclose and discuss suicidal ideas Ability to demonstrate self-control will improve Ability to identify and develop effective coping behaviors will improve Ability to maintain clinical measurements within normal limits will improve  Medication Management: Evaluate patient's response, side effects, and tolerance of medication regimen.  Therapeutic Interventions: 1 to 1 sessions, Unit Group sessions and Medication administration.  Evaluation of Outcomes: Adequate for Discharge   RN Treatment Plan for Primary Diagnosis: Bipolar 2 disorder (HCC) Long Term Goal(s): Knowledge of disease and therapeutic regimen to maintain health will improve  Short Term Goals: Ability to verbalize frustration and anger appropriately will improve, Ability to verbalize feelings will improve, Ability to disclose and discuss suicidal ideas and Ability to identify and develop effective coping behaviors will improve  Medication Management: RN will administer medications as ordered by provider, will assess and evaluate patient's response and provide education to  patient for prescribed medication. RN will report any adverse and/or side effects to prescribing provider.  Therapeutic Interventions: 1 on 1 counseling sessions, Psychoeducation, Medication administration, Evaluate responses to treatment, Monitor vital signs and CBGs as ordered, Perform/monitor CIWA, COWS, AIMS and Fall Risk screenings as ordered, Perform wound care treatments as ordered.  Evaluation of Outcomes: Adequate for Discharge   LCSW Treatment Plan for Primary Diagnosis: Bipolar 2  disorder (HCC) Long Term Goal(s): Safe transition to appropriate next level of care at discharge, Engage patient in therapeutic group addressing interpersonal concerns.  Short Term Goals: Engage patient in aftercare planning with referrals and resources, Identify triggers associated with mental health/substance abuse issues and Increase skills for wellness and recovery  Therapeutic Interventions: Assess for all discharge needs, 1 to 1 time with Social worker, Explore available resources and support systems, Assess for adequacy in community support network, Educate family and significant other(s) on suicide prevention, Complete Psychosocial Assessment, Interpersonal group therapy.  Evaluation of Outcomes: Adequate for Discharge   Progress in Treatment: Attending groups: Pt is new to milieu, continuing to assess  Participating in groups: Pt is new to milieu, continuing to assess  Taking medication as prescribed: Yes, MD continues to assess for medication changes as needed Toleration medication: Yes, no side effects reported at this time Family/Significant other contact made: Yes with wife Patient understands diagnosis: Yes AEB by willingness to seek treatment.  Discussing patient identified problems/goals with staff: Yes Medical problems stabilized or resolved: Yes Denies suicidal/homicidal ideation: Yes Issues/concerns per patient self-inventory: None Other: N/A  New problem(s) identified: None identified at this time.   New Short Term/Long Term Goal(s): None identified at this time.   Discharge Plan or Barriers: Pt will return home and follow-up with outpatient services.  Reason for Continuation of Hospitalization: None identified at this time.   Estimated Length of Stay: 0 days  Attendees: Patient: 08/13/2016  9:42 AM  Physician: Dr. Jama Flavors 08/13/2016  9:42 AM  Nursing: Joslyn Devon, RN 08/13/2016  9:42 AM  RN Care Manager: Onnie Boer, RN 08/13/2016  9:42 AM  Social Worker:  Vernie Shanks, LCSW; Heather Smart, LCSW 08/13/2016  9:42 AM  Recreational Therapist:  08/13/2016  9:42 AM  Other: Armandina Stammer, NP; Gray Bernhardt, NP 08/13/2016  9:42 AM  Other:  08/13/2016  9:42 AM  Other: 08/13/2016  9:42 AM    Scribe for Treatment Team: Verdene Lennert, LCSW 08/13/2016 9:42 AM

## 2016-08-13 NOTE — Progress Notes (Signed)
Discharge note:  Patient discharged home per MD order.  Patient received all personal belongings from unit and locker.  Reviewed AVS/transition record with patient and he indicated understanding.  Patient will follow up with his outpatient provider.  He received prescriptions of his medications.  He denies any thoughts of self harm.  [facility]

## 2016-08-13 NOTE — Progress Notes (Signed)
D: Pt presents with anxious affect and mood.  He reports he is discharging tomorrow and he feels safe to do so.  Pt reports he has a "therapy appointment Thursday, I had a family meeting today with the doctor and it went well."  Denies SI/HI, denies hallucinations, reports back pain of 7/10.  Pt has been visible in milieu interacting with peers appropriately.   A: Met with pt and offered support and encouragement.  Actively listened to pt.  Medication administered per order.  PRN medication administered for pain, anxiety, and muscle spasms.  Q15 minute safety checks maintained.   R:  Pt is compliant with medications.  He is receptive to treatment.  Pt is safe on the unit and he verbally contracts for safety.

## 2016-09-04 ENCOUNTER — Ambulatory Visit (HOSPITAL_COMMUNITY): Payer: Self-pay | Admitting: Psychiatry

## 2016-09-18 ENCOUNTER — Other Ambulatory Visit (HOSPITAL_COMMUNITY): Payer: Self-pay

## 2016-09-18 ENCOUNTER — Ambulatory Visit (INDEPENDENT_AMBULATORY_CARE_PROVIDER_SITE_OTHER): Payer: BLUE CROSS/BLUE SHIELD | Admitting: Psychiatry

## 2016-09-18 ENCOUNTER — Encounter (HOSPITAL_COMMUNITY): Payer: Self-pay | Admitting: Psychiatry

## 2016-09-18 VITALS — BP 136/78 | HR 94 | Ht 67.0 in | Wt 222.0 lb

## 2016-09-18 DIAGNOSIS — F3162 Bipolar disorder, current episode mixed, moderate: Secondary | ICD-10-CM

## 2016-09-18 DIAGNOSIS — Z79899 Other long term (current) drug therapy: Secondary | ICD-10-CM | POA: Diagnosis not present

## 2016-09-18 DIAGNOSIS — F902 Attention-deficit hyperactivity disorder, combined type: Secondary | ICD-10-CM | POA: Diagnosis not present

## 2016-09-18 DIAGNOSIS — Z87891 Personal history of nicotine dependence: Secondary | ICD-10-CM | POA: Diagnosis not present

## 2016-09-18 MED ORDER — LISDEXAMFETAMINE DIMESYLATE 70 MG PO CAPS: 70.0000 mg | ORAL_CAPSULE | Freq: Every day | ORAL | 0 refills | Status: DC

## 2016-09-18 MED ORDER — HYDROXYZINE HCL 25 MG PO TABS: 25.0000 mg | ORAL_TABLET | Freq: Four times a day (QID) | ORAL | 0 refills | Status: DC | PRN

## 2016-09-18 MED ORDER — DIVALPROEX SODIUM ER 500 MG PO TB24: 1500.0000 mg | ORAL_TABLET | Freq: Every day | ORAL | 3 refills | Status: DC

## 2016-09-18 MED ORDER — OLANZAPINE 5 MG PO TABS: 5.0000 mg | ORAL_TABLET | Freq: Every day | ORAL | 0 refills | Status: DC

## 2016-09-18 NOTE — Progress Notes (Signed)
BH MD/PA/NP OP Progress Note  09/18/2016 9:06 AM Jacob [name]  MRN:  [id number]  Chief Complaint: doing okay, need refills on meds  Subjective:  Patient presents 15 minutes late for his appointment. He reports this is his first psychiatric follow-up after hospitalization in early February. We reviewed the circumstances of the hospitalization. He reports that he and his wife have been doing well for the past one month, and there has not been any violent outbursts at home. He feels that the Depakote and Zyprexa are working effectively for his mood stability, and Vyvanse 70 mg continues to be helpful for his ADHD and impulsivity. I mentioned to the patient that he was instructed to stop Vyvanse on his discharge from the hospital, and he reports that he did not stop it, because he reports that it has always been helpful for him since he was a teenager, and when he got home he immediately resumed the medication. He reports that he tends to feel more calm, less impulsive with the Vyvanse, but also feels that in combination with Depakote it works very well to help with his anger and impulsivity.  Patient denies any suicidal or homicidal thoughts. He reports that he is continuing to work. He reports that he and his wife continue to work on strategies to avoid escalating conflicts, and respect each other's boundaries when one or the other is frustrated.  He agrees to continue the current medication regimen as prescribed, obtain necessary lab studies, and agrees to follow-up with writer in 2-3 months.    Visit Diagnosis:    ICD-9-CM ICD-10-CM   1. Bipolar 1 disorder, mixed, moderate (HCC) 296.62 F31.62 Valproic acid level     Comprehensive metabolic panel     divalproex (DEPAKOTE ER) 500 MG 24 hr tablet     OLANZapine (ZYPREXA) 5 MG tablet     hydrOXYzine (ATARAX/VISTARIL) 25 MG tablet     DISCONTINUED: hydrOXYzine (ATARAX/VISTARIL) 25 MG tablet  2. Attention deficit hyperactivity disorder (ADHD), combined  type 314.01 F90.2 lisdexamfetamine (VYVANSE) 70 MG capsule     lisdexamfetamine (VYVANSE) 70 MG capsule    Past Psychiatric History: See intake H&P for full details. Recently psychiatrically hospitalized at [facility] for aggressive acting out towards his wife. Depakote and Zyprexa were adjusted with good effect. The patient has a past psychiatric history of multiple hospitalizations, and had previously been on lithium in his teenage years for aggression and behavioral acting out.  Past Medical History:  Past Medical History:  Diagnosis Date  . ADHD (attention deficit hyperactivity disorder) evaluation   . Allergy   . Bipolar disorder (HCC)    No past surgical history on file.  Family Psychiatric History: See intake H&P for full details. Reviewed, with no updates at this time.   Family History:  Family History  Problem Relation Age of Onset  . Cancer Mother     Social History:  Social History   Social History  . Marital status: Married    Spouse name: N/A  . Number of children: 0  . 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 Smoking status: Former Smoker    Packs/day: 0.00    Years: 9.00    Quit date: 12/2010  . Smokeless tobacco: Never Used  . Alcohol use No     Comment: Rare  . Drug use: No  . Sexual activity: Yes    Partners: Female  Birth control/ protection: Condom, Diaphragm   Other Topics Concern  . None   Social History Narrative   Born and raised in [place]. Currently live in private residence with wife [name]). 3 cats, 2 dogs.   Fun: Engineer, petroleum   Denies religious beliefs that would effect healthcare.     Allergies: No Known Allergies  Metabolic Disorder Labs: Lab Results  Component Value Date   HGBA1C 5.4 08/09/2016   MPG 108 08/09/2016   Lab Results  Component Value Date   PROLACTIN 35.9 (H) 08/09/2016   Lab Results  Component Value Date   CHOL  128 08/09/2016   TRIG 97 08/09/2016   HDL 29 (L) 08/09/2016   CHOLHDL 4.4 08/09/2016   VLDL 19 08/09/2016   LDLCALC 80 08/09/2016     Current Medications: Current Outpatient Prescriptions  Medication Sig Dispense Refill  . albuterol (PROVENTIL HFA;VENTOLIN HFA) 108 (90 Base) MCG/ACT inhaler Inhale 2 puffs into the lungs every 6 (six) hours as needed for wheezing or shortness of breath. 1 Inhaler 2  . cyclobenzaprine (FLEXERIL) 10 MG tablet Take 1 tablet (10 mg total) by mouth 3 (three) times daily as needed for muscle spasms. 10 tablet 0  . divalproex (DEPAKOTE ER) 500 MG 24 hr tablet Take 3 tablets (1,500 mg total) by mouth daily. 270 tablet 3  . hydrOXYzine (ATARAX/VISTARIL) 25 MG tablet Take 1 tablet (25 mg total) by mouth every 6 (six) hours as needed for anxiety. 90 tablet 0  . [START ON 10/16/2016] lisdexamfetamine (VYVANSE) 70 MG capsule Take 1 capsule (70 mg total) by mouth daily. 90 capsule 0  . lisdexamfetamine (VYVANSE) 70 MG capsule Take 1 capsule (70 mg total) by mouth daily. 30 capsule 0  . mometasone-formoterol (DULERA) 200-5 MCG/ACT AERO Inhale 2 puffs into the lungs 2 (two) times daily. 1 Inhaler 1  . OLANZapine (ZYPREXA) 5 MG tablet Take 1 tablet (5 mg total) by mouth at bedtime. 90 tablet 0   No current facility-administered medications for this visit.     Neurologic: Headache: Negative Seizure: Negative Paresthesias: Negative  Musculoskeletal: Strength & Muscle Tone: within normal limits Gait & Station: normal Patient leans: N/A  Psychiatric Specialty Exam: Review of Systems  Constitutional: Negative.   HENT: Negative.   Respiratory: Negative.   Cardiovascular: Negative.   Gastrointestinal: Negative.   Musculoskeletal: Negative.   Neurological: Negative.   Psychiatric/Behavioral: Positive for memory loss. Negative for depression, hallucinations, substance abuse and suicidal ideas. The patient is not nervous/anxious and does not have insomnia.     Blood  pressure 136/78, pulse 94, height 5\' 7"  (1.702 m), weight 100.7 kg (222 lb).Body mass index is 34.77 kg/m.  General Appearance: Casual  Eye Contact:  Good  Speech:  Clear and Coherent  Volume:  Normal  Mood:  Euthymic  Affect:  Congruent  Thought Process:  Coherent and Goal Directed  Orientation:  Full (Time, Place, and Person)  Thought Content: Logical   Suicidal Thoughts:  No  Homicidal Thoughts:  No  Memory:  Immediate;   Good  Judgement:  Fair  Insight:  Shallow  Psychomotor Activity:  Normal  Concentration:  Concentration: Good  Recall:  NA  Fund of Knowledge: Good  Language: Good  Akathisia:  Negative  Handed:  Right  AIMS (if indicated):  n/a  Assets:  Communication Skills Desire for Improvement Financial Resources/Insurance Housing Intimacy Leisure Time Physical Health Resilience Social Support Talents/Skills Transportation Vocational/Educational  ADL's:  Intact  Cognition: WNL  Sleep:  8-9 hours,  good quality    Treatment Plan Summary: Jacob [name] is a [age] year old male with bipolar disorder, impulse control disorder unspecified, ADHD, who presents today for psychiatric follow-up.  He has a family history of bipolar disorder and depression. [facility] for aggressive acting out towards his wife.  This was in the context of the patient running out of Depakote for 3-4 days, and remaining on Vyvanse unencumbered by a mood stabilizer. He presents as currently stable, continuing on his regimen of Depakote 1500 mg ER, Zyprexa 5 mg nightly, and Vyvanse 70 mg daily. He denies any suicidal or homicidal thoughts, and denies any episodes of aggression between him and his wife for the past month. Will continue current regimen and follow-up in 2-3 months.  1. Bipolar 1 disorder, mixed, moderate (HCC)   2. Attention deficit hyperactivity disorder (ADHD), combined type   - continue Depakote 1500 mg ER nightly - Obtain CMP and  Depakote level - Continue Zyprexa 5 mg nightly - Continue Vyvanse 70 mg daily for ADHD - Return to clinic in 2-3 months  Burnard LeighAlexander Arya Eksir, MD 09/18/2016, 9:06 AM

## 2016-11-18 ENCOUNTER — Encounter (HOSPITAL_COMMUNITY): Payer: Self-pay | Admitting: Psychiatry

## 2016-11-18 ENCOUNTER — Ambulatory Visit (INDEPENDENT_AMBULATORY_CARE_PROVIDER_SITE_OTHER): Payer: BLUE CROSS/BLUE SHIELD | Admitting: Psychiatry

## 2016-11-18 VITALS — BP 126/78 | HR 82 | Ht 67.0 in | Wt 226.6 lb

## 2016-11-18 DIAGNOSIS — F3162 Bipolar disorder, current episode mixed, moderate: Secondary | ICD-10-CM

## 2016-11-18 DIAGNOSIS — Z79899 Other long term (current) drug therapy: Secondary | ICD-10-CM

## 2016-11-18 DIAGNOSIS — Z87891 Personal history of nicotine dependence: Secondary | ICD-10-CM

## 2016-11-18 DIAGNOSIS — F902 Attention-deficit hyperactivity disorder, combined type: Secondary | ICD-10-CM | POA: Diagnosis not present

## 2016-11-18 MED ORDER — LISDEXAMFETAMINE DIMESYLATE 70 MG PO CAPS: 70.0000 mg | ORAL_CAPSULE | Freq: Every day | ORAL | 0 refills | Status: DC

## 2016-11-18 MED ORDER — GUANFACINE HCL ER 2 MG PO TB24: 2.0000 mg | ORAL_TABLET | Freq: Every day | ORAL | 1 refills | Status: DC

## 2016-11-18 NOTE — Progress Notes (Signed)
BH MD/PA/NP OP Progress Note  11/18/2016 9:14 AM Jacob [name]  MRN:  [id number]  Chief Complaint: follow-up med management  Subjective: Jacob [name] presents today for follow-up. He presents with his wife. He has had increasing irritability, episodes of explosive verbal aggression. No physical aggression. He shares that his wife "just knows how to push my buttons". I had a frank conversation with the patient about his childhood trauma, and my impression that this is contributed to some of his personality and mood symptoms. We discussed this diagnosis of bipolar disorder, which frankly doesn't seem to fit with his clinical presentation. He and his wife agree with this, and have always felt like he has more ADHD and intermittent explosive disorder symptoms. This combined with PTSD, seems to make the most sense clinically for him. He continues to struggle with significantly increased appetite in the evenings, generally taking after Zyprexa. We discussed a plan to taper and discontinue Zyprexa, and to start Intuniv to help with some of his impulsivity and ADHD symptoms. We agreed to continue Depakote for now, in the future may consider adding an SSRI for mood and anxiety symptoms.  I spent time with the patient and his wife hearing about their arguments at home, typically over very small things. The patient will have "blow ups" that last about 5 minutes, whereby he may say very cruel and angry things, then he will feel very remorseful after about 5-10 minutes.  Discussed that this is much more consistent with the impulsivity and agitation associated with PTSD, and not very characteristic of a bipolar "episode". He has never had any episodes of mania.   Visit Diagnosis:    ICD-9-CM ICD-10-CM   1. Bipolar 1 disorder, mixed, moderate (HCC) 296.62 F31.62   2. Attention deficit hyperactivity disorder (ADHD), combined type 314.01 F90.2 lisdexamfetamine (VYVANSE) 70 MG capsule     guanFACINE (INTUNIV) 2 MG  TB24 ER tablet   Past Psychiatric History: See intake H&P for full details. Reviewed, with no updates at this time.  Past Medical History:  Past Medical History:  Diagnosis Date  . ADHD (attention deficit hyperactivity disorder) evaluation   . Allergy   . Bipolar disorder (HCC)    History reviewed. No pertinent surgical history.  Family Psychiatric History: See intake H&P for full details. Reviewed, with no updates at this time.   Family History:  Family History  Problem Relation Age of Onset  . Cancer Mother     Social History:  Social History   Social History  . Marital status: Married    Spouse name: N/A  . Number of children: 0  . Years of education: 12   Occupational History  . 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 Smoking status: Former Smoker    Packs/day: 0.00    Years: 9.00    Quit date: 12/2010  . Smokeless tobacco: Never Used  . Alcohol use No     Comment: Rare  . Drug use: No  . Sexual activity: Yes    Partners: Female    Birth control/ protection: Condom, Diaphragm   Other Topics Concern  . None   Social History Narrative   [place]. Currently live in private residence with wife Jacob [name](Jacob [name]). 3 cats, 2 dogs.   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     Allergies: No Known Allergies  Metabolic Disorder Labs: Lab Results  Component Value Date  HGBA1C 5.4 08/09/2016   MPG 108 08/09/2016   Lab Results  Component Value Date   PROLACTIN 35.9 (H) 08/09/2016   Lab Results  Component Value Date   CHOL 128 08/09/2016   TRIG 97 08/09/2016   HDL 29 (L) 08/09/2016   CHOLHDL 4.4 08/09/2016   VLDL 19 08/09/2016   LDLCALC 80 08/09/2016     Current Medications: Current Outpatient Prescriptions  Medication Sig Dispense Refill  . albuterol (PROVENTIL HFA;VENTOLIN HFA) 108 (90 Base) MCG/ACT inhaler Inhale 2 puffs into the lungs every 6 (six) hours as needed  for wheezing or shortness of breath. 1 Inhaler 2  . cyclobenzaprine (FLEXERIL) 10 MG tablet Take 1 tablet (10 mg total) by mouth 3 (three) times daily as needed for muscle spasms. 10 tablet 0  . divalproex (DEPAKOTE ER) 500 MG 24 hr tablet Take 3 tablets (1,500 mg total) by mouth daily. 270 tablet 3  . hydrOXYzine (ATARAX/VISTARIL) 25 MG tablet Take 1 tablet (25 mg total) by mouth every 6 (six) hours as needed for anxiety. 90 tablet 0  . [START ON 01/14/2017] lisdexamfetamine (VYVANSE) 70 MG capsule Take 1 capsule (70 mg total) by mouth daily. 90 capsule 0  . mometasone-formoterol (DULERA) 200-5 MCG/ACT AERO Inhale 2 puffs into the lungs 2 (two) times daily. 1 Inhaler 1  . guanFACINE (INTUNIV) 2 MG TB24 ER tablet Take 1 tablet (2 mg total) by mouth daily. 90 tablet 1   No current facility-administered medications for this visit.     Neurologic: Headache: Negative Seizure: Negative Paresthesias: Negative  Musculoskeletal: Strength & Muscle Tone: within normal limits Gait & Station: normal Patient leans: N/A  Psychiatric Specialty Exam: ROS  Blood pressure 126/78, pulse 82, height 5\' 7"  (1.702 m), weight 226 lb 9.6 oz (102.8 kg).Body mass index is 35.49 kg/m.  General Appearance: Casual and Fairly Groomed  Eye Contact:  Good  Speech:  Clear and Coherent  Volume:  Normal  Mood:  Euthymic  Affect:  Appropriate and Congruent  Thought Process:  Coherent  Orientation:  Full (Time, Place, and Person)  Thought Content: Logical   Suicidal Thoughts:  No  Homicidal Thoughts:  No  Memory:  Immediate;   Good  Judgement:  Fair  Insight:  Fair  Psychomotor Activity:  Normal  Concentration:  Concentration: Fair  Recall:  Good  Fund of Knowledge: Good  Language: Good  Akathisia:  Negative  Handed:  Right  AIMS (if indicated):  0  Assets:  Communication Skills Desire for Improvement Financial Resources/Insurance Housing Physical Health Resilience Vocational/Educational  ADL's:   Intact  Cognition: WNL  Sleep:  6-9 hours    Treatment Plan Summary: [name] is a 31 year old male with a psychiatric history most consistent with ADHD, intermittent explosive disorder of childhood, and chronic PTSD. He has been diagnosed with bipolar disorder in the past, but I do not believe this is consistent with his clinical presentation. We agreed to taper Zyprexa given the significant increase in his appetite, and continue as below. No acute safety issues, and we'll follow-up in 1 to 2 months.  1. Bipolar 1 disorder, mixed, moderate (HCC)   2. Attention deficit hyperactivity disorder (ADHD), combined type    Zyprexa tapered and discontinued Start Intuniv 2 mg at night Continue Vyvanse 70 mg daily Continue Depakote 1500 mg daily Follow-up in 1-2 months Patient instructed to journal 1-2 times weekly, practice sharing 3 good things with his wife, and I spent time discussing a plan for a "time out" if  he and his wife noticed that they start arguing about small things  Burnard Leigh, MD 11/18/2016, 9:14 AM

## 2016-11-18 NOTE — Patient Instructions (Signed)
Cut zyprexa down to 2.5 mg nightly, then stop in 1 week  Continue vyvanse in the morning Start Intuniv 2 mg at night

## 2016-12-18 ENCOUNTER — Other Ambulatory Visit: Payer: Self-pay | Admitting: Internal Medicine

## 2017-01-13 ENCOUNTER — Ambulatory Visit (HOSPITAL_COMMUNITY): Payer: Self-pay | Admitting: Psychiatry

## 2017-01-17 ENCOUNTER — Ambulatory Visit (INDEPENDENT_AMBULATORY_CARE_PROVIDER_SITE_OTHER): Payer: BLUE CROSS/BLUE SHIELD | Admitting: Psychiatry

## 2017-01-17 ENCOUNTER — Encounter (HOSPITAL_COMMUNITY): Payer: Self-pay | Admitting: Psychiatry

## 2017-01-17 VITALS — BP 132/82 | HR 90 | Ht 67.5 in | Wt 205.0 lb

## 2017-01-17 DIAGNOSIS — F902 Attention-deficit hyperactivity disorder, combined type: Secondary | ICD-10-CM | POA: Diagnosis not present

## 2017-01-17 DIAGNOSIS — R4589 Other symptoms and signs involving emotional state: Secondary | ICD-10-CM | POA: Diagnosis not present

## 2017-01-17 DIAGNOSIS — R4689 Other symptoms and signs involving appearance and behavior: Secondary | ICD-10-CM

## 2017-01-17 DIAGNOSIS — Z87891 Personal history of nicotine dependence: Secondary | ICD-10-CM

## 2017-01-17 MED ORDER — GUANFACINE HCL ER 4 MG PO TB24: 4.0000 mg | ORAL_TABLET | Freq: Every day | ORAL | 1 refills | Status: DC

## 2017-01-17 MED ORDER — CHLORPROMAZINE HCL 25 MG PO TABS: 25.0000 mg | ORAL_TABLET | Freq: Three times a day (TID) | ORAL | 1 refills | Status: DC | PRN
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
## 2017-03-05 ENCOUNTER — Other Ambulatory Visit (HOSPITAL_COMMUNITY): Payer: Self-pay

## 2017-03-05 ENCOUNTER — Ambulatory Visit (INDEPENDENT_AMBULATORY_CARE_PROVIDER_SITE_OTHER): Payer: BLUE CROSS/BLUE SHIELD | Admitting: Psychiatry

## 2017-03-05 ENCOUNTER — Encounter (HOSPITAL_COMMUNITY): Payer: Self-pay | Admitting: Psychiatry

## 2017-03-05 DIAGNOSIS — Z87891 Personal history of nicotine dependence: Secondary | ICD-10-CM

## 2017-03-05 DIAGNOSIS — R451 Restlessness and agitation: Secondary | ICD-10-CM | POA: Diagnosis not present

## 2017-03-05 DIAGNOSIS — F419 Anxiety disorder, unspecified: Secondary | ICD-10-CM | POA: Diagnosis not present

## 2017-03-05 DIAGNOSIS — F902 Attention-deficit hyperactivity disorder, combined type: Secondary | ICD-10-CM | POA: Diagnosis not present

## 2017-03-05 DIAGNOSIS — R454 Irritability and anger: Secondary | ICD-10-CM | POA: Diagnosis not present

## 2017-03-05 DIAGNOSIS — Z79899 Other long term (current) drug therapy: Secondary | ICD-10-CM

## 2017-03-05 DIAGNOSIS — F3162 Bipolar disorder, current episode mixed, moderate: Secondary | ICD-10-CM

## 2017-03-05 MED ORDER — LISDEXAMFETAMINE DIMESYLATE 70 MG PO CAPS: 70.0000 mg | ORAL_CAPSULE | Freq: Every day | ORAL | 0 refills | Status: DC

## 2017-03-05 MED ORDER — GUANFACINE HCL ER 3 MG PO TB24: 3.0000 mg | ORAL_TABLET | Freq: Two times a day (BID) | ORAL | 1 refills | Status: DC

## 2017-03-05 NOTE — Progress Notes (Signed)
[place] MD/PA/NP OP Progress Note  03/05/2017 9:53 AM JASTEN GUYETTE  MRN:  [id number]  Chief Complaint: med management  HPI: GIANPAOLO MINDEL presents for med management follow-up.  I spent 30 minutes with the patient individually discussing how he feels like things been going. He reports that the Intuniv does seem to help him not have as much of an adrenaline surge or physical feelings of anger, and it gives him a little bit of time before he has explosions, so that he can think about his behavior. He reports that it's been easier for him to have less road rage. He admits that he did have a couple episodes of feeling angry, driving erratically and quickly on the road, and having screaming/yelling at home. He has not had any physical aggression, breaking items, or violence towards others.  He reports that he is able to generally stay calm and collected when he is in social settings, at work, at a football game. He reports that he went to a panther's and patriots game last week, and the experience was overall fairly positive. He reports that after the game his wife was nagging at him about how he was driving, minor things such as how he yelled a lot during the football game (in excitement), and he feels like his wife nags at him "one complaint after another" and he admits that is quite difficult to tolerate.  I continue to encourage couples therapy which they have not arranged. He continues on Depakote, Intuniv, and Vyvanse. He has not used any Thorazine, because he reports things have not gotten to that level of urgency. He has used Vistaril a few times for anxiety.  He wonders about increasing the Intuniv, and I reviewed the risks and benefits of increasing Intuniv over the maximum dose of 4 mg. We agreed to increase to 3 mg twice daily.  He denies any suicidal thoughts or unsafe thoughts.  I met with his wife, to ask her what her take on things have been, and she feels like his behaviors are still the same. With  further questioning, she admits that he does seem to be able to better handle his road rage, he does seem to be more physically calm when he is angry, and there has not been any violence or aggression other than verbal arguments. She reports that he continues to act like a child and it is embarrassing her when they're out in public when he gets upset. I spent time again engaging her on what her role can be in terms of ignoring, and setting boundaries of what she will and will not respond to. I spent time setting appropriate expectations with her that these are behaviors which have accrued over the course of 30 years, and that it will take time for him to learn new skills, and it's important to arrange for couples therapy so that they can set appropriate goals together. Wife continues to complain that she has dealt with this for too long and doesn't know she can continue to tolerate it. I encouraged her to think about her own limit setting, and whether or not she wishes to remain in the marriage is up to her and him. I continue to recommend that they engage in both individual and couples therapy, and there is a significant limitation on what we will be able to do medication management-wise for these sorts of intermittent explosive behaviors.    Visit Diagnosis:    ICD-10-CM   1. Attention deficit hyperactivity  disorder (ADHD), combined type F90.2 guanFACINE 3 MG TB24    lisdexamfetamine (VYVANSE) 70 MG capsule    Ambulatory referral to Psychology    Past Psychiatric History: See intake H&P for full details. Reviewed, with no updates at this time.  Past Medical History:  Past Medical History:  Diagnosis Date  . ADHD (attention deficit hyperactivity disorder) evaluation   . Allergy   . Bipolar disorder ([place])    History reviewed. No pertinent surgical history.  Family Psychiatric History: See intake H&P for full details. Reviewed, with no updates at this time.   Family History:  Family History   Problem Relation Age of Onset  . Cancer Mother     Social History:  Social History   Social History  . Marital status: Married    Spouse name: N/A  . Number of children: 0  . Years of education: 12   Occupational History  . Asst Manager [employer]   Social History Main Topics  . Smoking status: Former Smoker    Packs/day: 0.00    Years: 9.00    Quit date: 12/2010  . Smokeless tobacco: Never Used  . Alcohol use No     Comment: Rare  . Drug use: No  . Sexual activity: Yes    Partners: Female    Birth control/ protection: Condom, Diaphragm   Other Topics Concern  . None   Social History Narrative   Born and raised in [place]. Currently live in private residence with wife Sharyn Lull). 3 cats, 2 dogs.   Fun: Estate agent   Denies religious beliefs that would effect healthcare.     Allergies: No Known Allergies  Metabolic Disorder Labs: Lab Results  Component Value Date   HGBA1C 5.4 08/09/2016   MPG 108 08/09/2016   Lab Results  Component Value Date   PROLACTIN 35.9 (H) 08/09/2016   Lab Results  Component Value Date   CHOL 128 08/09/2016   TRIG 97 08/09/2016   HDL 29 (L) 08/09/2016   CHOLHDL 4.4 08/09/2016   VLDL 19 08/09/2016   LDLCALC 80 08/09/2016   Lab Results  Component Value Date   TSH 0.668 08/09/2016   TSH 1.16 07/07/2014    Therapeutic Level Labs: No results found for: LITHIUM Lab Results  Component Value Date   VALPROATE 96 08/08/2016   VALPROATE 100.1 (H) 05/29/2016   No components found for:  CBMZ  Current Medications: Current Outpatient Prescriptions  Medication Sig Dispense Refill  . albuterol (PROVENTIL HFA;VENTOLIN HFA) 108 (90 Base) MCG/ACT inhaler Inhale 2 puffs into the lungs every 6 (six) hours as needed for wheezing or shortness of breath. 1 Inhaler 2  . chlorproMAZINE (THORAZINE) 25 MG tablet Take 1 tablet (25 mg total) by mouth 3 (three) times daily as needed. 90 tablet 1  . divalproex (DEPAKOTE ER) 500  MG 24 hr tablet Take 3 tablets (1,500 mg total) by mouth daily. 270 tablet 3  . guanFACINE 3 MG TB24 Take 1 tablet (3 mg total) by mouth 2 (two) times daily. 6 mg total daily 180 tablet 1  . hydrOXYzine (ATARAX/VISTARIL) 25 MG tablet Take 1 tablet (25 mg total) by mouth every 6 (six) hours as needed for anxiety. 90 tablet 0  . lisdexamfetamine (VYVANSE) 70 MG capsule Take 1 capsule (70 mg total) by mouth daily. 90 capsule 0  . SYMBICORT 160-4.5 MCG/ACT inhaler INHALE TWO PUFFS BY MOUTH TWICE DAILY 10.2 g 0   No current facility-administered medications for this visit.  Musculoskeletal: Strength & Muscle Tone: within normal limits Gait & Station: normal Patient leans: N/A  Psychiatric Specialty Exam: ROS  Blood pressure 126/80, pulse 74, height 5' 7"  (1.702 m), weight 197 lb 9.6 oz (89.6 kg).Body mass index is 30.95 kg/m.  General Appearance: Casual and Fairly Groomed  Eye Contact:  Good  Speech:  Clear and Coherent  Volume:  Normal  Mood:  Euthymic  Affect:  Appropriate and Congruent  Thought Process:  Goal Directed  Orientation:  Full (Time, Place, and Person)  Thought Content: Logical   Suicidal Thoughts:  No  Homicidal Thoughts:  No  Memory:  Immediate;   Good  Judgement:  Fair  Insight:  Shallow  Psychomotor Activity:  Normal  Concentration:  Attention Span: Good  Recall:  Good  Fund of Knowledge: Good  Language: Good  Akathisia:  Negative  Handed:  Right  AIMS (if indicated): not done  Assets:  Communication Skills Desire for Improvement Financial Resources/Insurance Housing Intimacy Physical Health Transportation Vocational/Educational  ADL's:  Intact  Cognition: WNL  Sleep:  Fair   Screenings: AUDIT     Admission (Discharged) from 08/07/2016 in [place] [facility]  Alcohol Use Disorder Identification Test Final Score (AUDIT)  0    PHQ2-9     Office Visit from 02/02/2016 in [facility] at [facility]  PHQ-2 Total Score  0        Assessment and Plan: RONDELL PARDON is a 31 year old male with ADHD and intermittent explosive disorder who presents today for medication management follow-up. He has a history of antisocial behaviors, and limited remorse for his periods of aggression.  He has a lifelong history learned aggression and explosive acting out, which is in part attributed to his temperament and childhood history. He has never acted aggressively in our office, and has been able to keep consistent employment because he is able to contain these impulsive aggressive behaviors when it is socially expected of him. Most of his verbal aggression comes out when he is in interpersonal conflict with his wife, or he is driving on the road with his wife and feeling attacked and criticized by his wife. I have seen the patient and his wife argue in office, and frankly, the patient has been able to maintain a calm affect in the face of significant lability and irritability from his wife.   This illustrateds that much of his behaviors are able to be contained when socially expected. I have attempted multiple times to educate his wife on the power of ignoring, and also setting her own personal boundaries of what would prevent her from continuing in the marriage, and recommended that she engage in individual therapy, and she has yet to make any of those changes. Their system of interaction is quite flawed I'm hopeful that they can learn improved communication in couples therapy. He does not present any acute safety issues at this time, and we will proceed as below, follow-up in 2-3 months.  He does seem to have a good response to alpha modulators, in terms of reduced sense of urgency to react when he is angry, so we will further titrate Intuniv. If he does not have ongoing benefits from Intuniv, we will switch to clonidine.  1. Attention deficit hyperactivity disorder (ADHD), combined type    Continue Vyvanse 70 mg daily  Intuniv 3 mg  twice daily (reviewed that this is an increase over the FDA recommended dose, and recommended that he be consistent with good hydration, and  reviewed the risks and benefits of this increase) Continue Depakote 1500 mg daily Continue Vistaril when necessary for anxiety Continue Thorazine 25 mg for severe agitation when needed, he has yet to use this since prescribed last month Check CMP, CBC, Depakote level, testosterone today in office Recommended couples therapy, I placed a referral for this as of last month  Notably, the patient has been tried on Abilify, Lexapro, Lamictal, Ritalin, Zyprexa, Seroquel in adulthood, and reports that he was tried on multiple agents as a child including lithium.  Aundra Dubin, MD 03/05/2017, 9:53 AM

## 2017-03-08 LAB — COMPREHENSIVE METABOLIC PANEL
A/G RATIO: 2 (ref 1.2–2.2)
ALK PHOS: 38 IU/L — AB (ref 39–117)
ALT: 26 IU/L (ref 0–44)
AST: 34 IU/L (ref 0–40)
Albumin: 4.7 g/dL (ref 3.5–5.5)
BUN/Creatinine Ratio: 11 (ref 9–20)
BUN: 14 mg/dL (ref 6–20)
Bilirubin Total: 0.3 mg/dL (ref 0.0–1.2)
CO2: 21 mmol/L (ref 20–29)
Calcium: 10.1 mg/dL (ref 8.7–10.2)
Chloride: 104 mmol/L (ref 96–106)
Creatinine, Ser: 1.27 mg/dL (ref 0.76–1.27)
GFR calc Af Amer: 87 mL/min/{1.73_m2} (ref >59)
GFR calc non Af Amer: 75 mL/min/{1.73_m2} (ref >59)
GLOBULIN, TOTAL: 2.3 g/dL (ref 1.5–4.5)
Glucose: 87 mg/dL (ref 65–99)
POTASSIUM: 5.2 mmol/L (ref 3.5–5.2)
SODIUM: 145 mmol/L — AB (ref 134–144)
Total Protein: 7 g/dL (ref 6.0–8.5)

## 2017-03-08 LAB — CBC WITH DIFFERENTIAL
BASOS ABS: 0.1 10*3/uL (ref 0.0–0.2)
Basos: 1 %
EOS (ABSOLUTE): 1.1 10*3/uL — ABNORMAL HIGH (ref 0.0–0.4)
EOS: 13 %
Hematocrit: 48.9 % (ref 37.5–51.0)
Hemoglobin: 16.2 g/dL (ref 13.0–17.7)
IMMATURE GRANS (ABS): 0 10*3/uL (ref 0.0–0.1)
IMMATURE GRANULOCYTES: 0 %
LYMPHS ABS: 2.9 10*3/uL (ref 0.7–3.1)
Lymphs: 35 %
MCH: 32 pg (ref 26.6–33.0)
MCHC: 33.1 g/dL (ref 31.5–35.7)
MCV: 97 fL (ref 79–97)
MONOCYTES: 8 %
Monocytes Absolute: 0.7 10*3/uL (ref 0.1–0.9)
NEUTROS PCT: 43 %
Neutrophils Absolute: 3.5 10*3/uL (ref 1.4–7.0)
RBC: 5.06 x10E6/uL (ref 4.14–5.80)
RDW: 13 % (ref 12.3–15.4)
WBC: 8.2 10*3/uL (ref 3.4–10.8)

## 2017-03-08 LAB — TESTOSTERONE FREE, PROFILE I
SEX HORMONE BINDING: 32.7 nmol/L (ref 16.5–55.9)
TESTOSTERONE: 392 ng/dL (ref 264–916)
Testost., Free, Calc: 76.3 pg/mL (ref 47.7–173.9)

## 2017-03-08 LAB — VALPROIC ACID LEVEL: Valproic Acid Lvl: 69 ug/mL (ref 50–100)

## 2017-04-21 ENCOUNTER — Telehealth: Payer: Self-pay | Admitting: Family

## 2017-04-22 ENCOUNTER — Encounter: Payer: Self-pay | Admitting: Nurse Practitioner

## 2017-04-22 ENCOUNTER — Other Ambulatory Visit: Payer: Self-pay | Admitting: Family

## 2017-04-22 MED ORDER — BUDESONIDE-FORMOTEROL FUMARATE 160-4.5 MCG/ACT IN AERO: 2.0000 | INHALATION_SPRAY | Freq: Two times a day (BID) | RESPIRATORY_TRACT | 0 refills | Status: DC

## 2017-04-22 NOTE — Telephone Encounter (Signed)
Pt has been scheduled with [facility], patient would like refill , please advise

## 2017-04-22 NOTE — Telephone Encounter (Signed)
Per office policy sent 30 day to local [facility] pharmacy until appt.../lmb  

## 2017-04-22 NOTE — Addendum Note (Signed)
Addended by: Deatra James on: 04/22/2017 04:24 PM   Modules accepted: Orders

## 2017-04-30 ENCOUNTER — Encounter (HOSPITAL_COMMUNITY): Payer: Self-pay | Admitting: Psychiatry

## 2017-04-30 ENCOUNTER — Ambulatory Visit (INDEPENDENT_AMBULATORY_CARE_PROVIDER_SITE_OTHER): Payer: BLUE CROSS/BLUE SHIELD | Admitting: Psychiatry

## 2017-04-30 VITALS — BP 120/76 | HR 76 | Ht 67.0 in | Wt 199.6 lb

## 2017-04-30 DIAGNOSIS — F902 Attention-deficit hyperactivity disorder, combined type: Secondary | ICD-10-CM

## 2017-04-30 DIAGNOSIS — Z79899 Other long term (current) drug therapy: Secondary | ICD-10-CM | POA: Diagnosis not present

## 2017-04-30 DIAGNOSIS — Z87891 Personal history of nicotine dependence: Secondary | ICD-10-CM

## 2017-04-30 DIAGNOSIS — G4730 Sleep apnea, unspecified: Secondary | ICD-10-CM | POA: Diagnosis not present

## 2017-04-30 DIAGNOSIS — F3162 Bipolar disorder, current episode mixed, moderate: Secondary | ICD-10-CM | POA: Diagnosis not present

## 2017-04-30 DIAGNOSIS — F6381 Intermittent explosive disorder: Secondary | ICD-10-CM | POA: Diagnosis not present

## 2017-04-30 MED ORDER — GUANFACINE HCL ER 3 MG PO TB24: 3.0000 mg | ORAL_TABLET | Freq: Two times a day (BID) | ORAL | 1 refills | Status: DC

## 2017-04-30 MED ORDER — LISDEXAMFETAMINE DIMESYLATE 70 MG PO CAPS: 70.0000 mg | ORAL_CAPSULE | Freq: Every day | ORAL | 0 refills | Status: DC

## 2017-04-30 NOTE — Patient Instructions (Signed)
Call sleep clinic to schedule assap  [phone]

## 2017-04-30 NOTE — Progress Notes (Signed)
[facility] MD/PA/NP OP Progress Note  04/30/2017 9:31 AM CHEIKH BRAMBLE  MRN:  [id number]  Chief Complaint: med check HPI: AVELINO HERREN reports that things are going better with the Intuniv 3 mg twice daily.  He reports that he had some mild lightheadedness but this has decreased.  He denies any suicidal thoughts or unsafe behaviors at home.  He reports that he continues to feel frustrated and angry at times but this is about 60% improved from the last visit.  He reports that he does not tend to have any issues at work, and his frustrations tends to be an interpersonal interactions.  He reports that now when he and his wife get into an argument, he goes for a walk or goes for a drive.  He reports that this is effective at helping him calm down.  He continues on Vyvanse, Intuniv, Depakote.  He does not use any Thorazine.  He is interested in trying CBD oil to help with sleep and back pain at night.  I spent time with him reviewing some of the concerns we have with over-the-counter herbal supplements, and cautioned him that he may be positive on a urine drug screen.  He was appreciative of the feedback and information, and will likely try this and let writer know how it goes.    He is also interested in what medication changes we could make to help reduce even further the periods of agitation and irritability, and mood fluctuation.  I educated him on Vraylar and I also provided him with Vraylar, and suggested that if he notices an increase in agitation, or paranoia, to please start 1.5 mg daily and let writer know.    Regarding sleep, he reports that he is using his wife's old CPAP machine because he was told by his wife that he stops and start breathing, he snores.  I suggested we get a split sleep study to assess for and treat any potential sleep apnea.  He was agreeable to this.  Visit Diagnosis:    ICD-10-CM   1. Sleep-disordered breathing G47.30 Split night study  2. Attention deficit hyperactivity  disorder (ADHD), combined type F90.2 GuanFACINE HCl 3 MG TB24    lisdexamfetamine (VYVANSE) 70 MG capsule  3. Bipolar 1 disorder, mixed, moderate (HCC) F31.62   4. Intermittent explosive disorder in adult F63.81     Past Psychiatric History: See intake H&P for full details. Reviewed, with no updates at this time.   Past Medical History:  Past Medical History:  Diagnosis Date  . ADHD (attention deficit hyperactivity disorder) evaluation   . Allergy   . Bipolar disorder (HCC)    History reviewed. No pertinent surgical history.  Family Psychiatric History: See intake H&P for full details. Reviewed, with no updates at this time.   Family History:  Family History  Problem Relation Age of Onset  . Cancer Mother     Social History:  Social History   Social History  . Marital status: Married    Spouse name: N/A  . Number of children: 0  . 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 Smoking status: Former Smoker    Packs/day: 0.00    Years: 9.00    Quit date: 12/2010  . Smokeless tobacco: Never Used  . Alcohol use No     Comment: Rare  . Drug use: No  . Sexual activity: Yes    Partners:  Female    Birth control/ protection: Condom, Diaphragm   Other Topics Concern  . None   Social History Narrative   Born and raised in [place]. Currently live in private residence with wife [name]. 3 cats, 2 dogs.   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     Allergies: No Known Allergies  Metabolic Disorder Labs: Lab Results  Component Value Date   HGBA1C 5.4 08/09/2016   MPG 108 08/09/2016   Lab Results  Component Value Date   PROLACTIN 35.9 (H) 08/09/2016   Lab Results  Component Value Date   CHOL 128 08/09/2016   TRIG 97 08/09/2016   HDL 29 (L) 08/09/2016   CHOLHDL 4.4 08/09/2016   VLDL 19 08/09/2016   LDLCALC 80 08/09/2016   Lab Results   Component Value Date   TSH 0.668 08/09/2016   TSH 1.16 07/07/2014    Therapeutic Level Labs: No results found for: LITHIUM Lab Results  Component Value Date   VALPROATE 69 03/05/2017   VALPROATE 96 08/08/2016   No components found for:  CBMZ  Current Medications: Current Outpatient Prescriptions  Medication Sig Dispense Refill  . albuterol (PROVENTIL HFA;VENTOLIN HFA) 108 (90 Base) MCG/ACT inhaler Inhale 2 puffs into the lungs every 6 (six) hours as needed for wheezing or shortness of breath. 1 Inhaler 2  . budesonide-formoterol (SYMBICORT) 160-4.5 MCG/ACT inhaler Inhale 2 puffs into the lungs 2 (two) times daily. Must keep appt w/new provider for future refills 10.2 g 0  . divalproex (DEPAKOTE ER) 500 MG 24 hr tablet Take 3 tablets (1,500 mg total) by mouth daily. 270 tablet 3  . GuanFACINE HCl 3 MG TB24 Take 1 tablet (3 mg total) by mouth 2 (two) times daily. 6 mg total daily 180 tablet 1  . hydrOXYzine (ATARAX/VISTARIL) 25 MG tablet Take 1 tablet (25 mg total) by mouth every 6 (six) hours as needed for anxiety. 90 tablet 0  . lisdexamfetamine (VYVANSE) 70 MG capsule Take 1 capsule (70 mg total) by mouth daily. 90 capsule 0   No current facility-administered medications for this visit.      Musculoskeletal: Strength & Muscle Tone: within normal limits Gait & Station: normal Patient leans: N/A  Psychiatric Specialty Exam: ROS  Blood pressure 120/76, pulse 76, height 5\' 7"  (1.702 m), weight 199 lb 9.6 oz (90.5 kg).Body mass index is 31.26 kg/m.  General Appearance: Casual  Eye Contact:  Good  Speech:  Clear and Coherent  Volume:  Normal  Mood:  Euthymic  Affect:  Congruent  Thought Process:  Goal Directed and Descriptions of Associations: Intact  Orientation:  Full (Time, Place, and Person)  Thought Content: Logical   Suicidal Thoughts:  No  Homicidal Thoughts:  No  Memory:  Immediate;   Good  Judgement:  Good  Insight:  Fair  Psychomotor Activity:  Normal   Concentration:  Concentration: Fair  Recall:  Fair  Fund of Knowledge: Good  Language: Good  Akathisia:  Negative  Handed:  Right  AIMS (if indicated): not done  Assets:  Communication Skills Desire for Improvement Financial Resources/Insurance Housing Intimacy Leisure Time Physical Health Resilience Social Support Talents/Skills Transportation Vocational/Educational  ADL's:  Intact  Cognition: WNL  Sleep:  Fair   Screenings: AUDIT     [facility]  Alcohol Use Disorder Identification Test Final Score (AUDIT)  0    PHQ2-9     Office Visit from 02/02/2016  in Primary Care at [facility] Total Score  0       Assessment and Plan:  ELLINGTON GREENSLADE is a 31 year old male with bipolar spectrum disorder, versus mood disorder, ADHD, intermittent explosive disorder who presents today for medication management.  He has had gradual improvement as we have gradually uptitrated Intuniv, and he reports that he is better able to govern some of his impulses when he is angry.  He does not present any acute suicidality or aggression towards others, and continues to participate well in follow-up care.  1. Sleep-disordered breathing   2. Attention deficit hyperactivity disorder (ADHD), combined type   3. Bipolar 1 disorder, mixed, moderate (HCC)   4. Intermittent explosive disorder in adult     Status of current problems: gradually improving  Labs Ordered: Orders Placed This Encounter  Procedures  . Split night study    Standing Status:   Future    Standing Expiration Date:   04/30/2018    Order Specific Question:   Where should this test be performed:    Answer:   [facility]    Labs Reviewed: NA  Collateral Obtained/Records Reviewed: N/A  Plan:  Continue Vyvanse 70 mg daily Continue Intuniv 3 mg twice daily Continue Depakote 1500 mg nightly Consider Cariprazine patient will think  about it Follow-up in 6-8 weeks  I spent 30 minutes with the patient in direct face-to-face clinical care.  Greater than 50% of this time was spent in counseling and coordination of care with the patient.    Burnard Leigh, MD 04/30/2017, 9:31 AM

## 2017-05-07 ENCOUNTER — Ambulatory Visit (HOSPITAL_BASED_OUTPATIENT_CLINIC_OR_DEPARTMENT_OTHER): Payer: BLUE CROSS/BLUE SHIELD | Attending: Psychiatry | Admitting: Internal Medicine

## 2017-05-07 VITALS — Ht 67.0 in | Wt 199.0 lb

## 2017-05-07 DIAGNOSIS — G4730 Sleep apnea, unspecified: Secondary | ICD-10-CM

## 2017-05-07 DIAGNOSIS — G4763 Sleep related bruxism: Secondary | ICD-10-CM | POA: Diagnosis not present

## 2017-05-13 ENCOUNTER — Telehealth (HOSPITAL_COMMUNITY): Payer: Self-pay

## 2017-05-13 ENCOUNTER — Other Ambulatory Visit (HOSPITAL_COMMUNITY): Payer: Self-pay | Admitting: Psychiatry

## 2017-05-13 MED ORDER — CARIPRAZINE HCL 1.5 MG PO CAPS: 1.5000 mg | ORAL_CAPSULE | Freq: Every day | ORAL | 1 refills | Status: DC

## 2017-05-13 NOTE — Telephone Encounter (Signed)
Okay, called the patient and let him know

## 2017-05-13 NOTE — Telephone Encounter (Signed)
Patient was going to e-mail you through my chart but could not find you. He said the Hedda SladeVrylar is working and would like you to send in a prescription. Please advise, thank you

## 2017-05-13 NOTE — Telephone Encounter (Signed)
Great, [facility] him

## 2017-05-17 DIAGNOSIS — G4730 Sleep apnea, unspecified: Secondary | ICD-10-CM | POA: Diagnosis not present

## 2017-05-17 NOTE — Procedures (Signed)
Patient Name: Jacob [name], Amahri Study Date: 05/07/2017 Gender: Male D.O.B: July 19, 1985 Age (years): 31 Referring Provider: Angus PalmsAlexander Eksir Height (inches): 67 Interpreting Physician: Jetty Duhamellinton Young MD, ABSM Weight (lbs): 199 RPSGT: Lowry RamMckinney, Takeya BMI: 31 MRN: [id number] Neck Size: 15.50 CLINICAL INFORMATION Sleep Study Type: NPSG  Indication for sleep study: Obesity, Snoring  Epworth Sleepiness Score: 14  SLEEP STUDY TECHNIQUE As per the AASM Manual for the Scoring of Sleep and Associated Events v2.3 (April 2016) with a hypopnea requiring 4% desaturations.  The channels recorded and monitored were frontal, central and occipital EEG, electrooculogram (EOG), submentalis EMG (chin), nasal and oral airflow, thoracic and abdominal wall motion, anterior tibialis EMG, snore microphone, electrocardiogram, and pulse oximetry.  MEDICATIONS Medications self-administered by patient taken the night of the study : GUANFACINE  SLEEP ARCHITECTURE The study was initiated at 10:58:28 PM and ended at 5:30:54 AM.  Sleep onset time was 7.2 minutes and the sleep efficiency was 88.9%. The total sleep time was 349.0 minutes.  Stage REM latency was 219.0 minutes.  The patient spent 11.17% of the night in stage N1 sleep, 78.94% in stage N2 sleep, 0.00% in stage N3 and 9.89% in REM.  Alpha intrusion was absent.  Supine sleep was 72.15%.  RESPIRATORY PARAMETERS The overall apnea/hypopnea index (AHI) was 1.2 per hour. There were 4 total apneas, including 3 obstructive, 1 central and 0 mixed apneas. There were 3 hypopneas and 33 RERAs.  The AHI during Stage REM sleep was 0.0 per hour.  AHI while supine was 1.4 per hour.  The mean oxygen saturation was 96.11%. The minimum SpO2 during sleep was 91.00%.  soft snoring was noted during this study.  CARDIAC DATA The 2 lead EKG demonstrated sinus rhythm. The mean heart rate was 57.59 beats per minute. Other EKG findings include: None.  LEG MOVEMENT  DATA The total PLMS were 4 with a resulting PLMS index of 0.69. Associated arousal with leg movement index was 0.2 .  IMPRESSIONS - No significant obstructive sleep apnea occurred during this study (AHI = 1.2/h). - No significant central sleep apnea occurred during this study (CAI = 0.2/h). - The patient had minimal or no oxygen desaturation during the study (Min O2 = 91.00%) - The patient snored with soft snoring volume. - No cardiac abnormalities were noted during this study. - Clinically significant periodic limb movements did not occur during sleep. No significant associated arousals. - Bruxism was noted.  DIAGNOSIS - Normal study - Bruxism (327.53 [G47.63 ICD-10])  RECOMMENDATIONS - Consider oral bite guard for bruxism - Be careful with alcohol, sedatives and other CNS depressants that may worsen sleep apnea and disrupt normal sleep architecture. - Sleep hygiene should be reviewed to assess factors that may improve sleep quality. - Weight management and regular exercise should be initiated or continued if appropriate.  [Electronically signed] 05/17/2017 02:11 PM  Jetty Duhamellinton Young MD, ABSM Diplomate, American Board of Sleep Medicine   NPI: [id number]

## 2017-06-06 ENCOUNTER — Telehealth: Payer: Self-pay | Admitting: Nurse Practitioner

## 2017-06-06 ENCOUNTER — Ambulatory Visit (INDEPENDENT_AMBULATORY_CARE_PROVIDER_SITE_OTHER): Payer: BLUE CROSS/BLUE SHIELD | Admitting: Nurse Practitioner

## 2017-06-06 ENCOUNTER — Ambulatory Visit (INDEPENDENT_AMBULATORY_CARE_PROVIDER_SITE_OTHER)
Admission: RE | Admit: 2017-06-06 | Discharge: 2017-06-06 | Disposition: A | Discharge status: Home / Self Care | Payer: BLUE CROSS/BLUE SHIELD | Source: Ambulatory Visit | Attending: Nurse Practitioner | Admitting: Nurse Practitioner

## 2017-06-06 ENCOUNTER — Encounter: Payer: Self-pay | Admitting: Nurse Practitioner

## 2017-06-06 VITALS — BP 142/90 | HR 89 | Temp 98.9°F | Ht 67.0 in | Wt 208.0 lb

## 2017-06-06 DIAGNOSIS — M5441 Lumbago with sciatica, right side: Secondary | ICD-10-CM

## 2017-06-06 DIAGNOSIS — G8929 Other chronic pain: Secondary | ICD-10-CM

## 2017-06-06 DIAGNOSIS — M545 Low back pain: Secondary | ICD-10-CM | POA: Diagnosis not present

## 2017-06-06 DIAGNOSIS — Z23 Encounter for immunization: Secondary | ICD-10-CM

## 2017-06-06 DIAGNOSIS — R030 Elevated blood-pressure reading, without diagnosis of hypertension: Secondary | ICD-10-CM | POA: Diagnosis not present

## 2017-06-06 MED ORDER — BUDESONIDE-FORMOTEROL FUMARATE 160-4.5 MCG/ACT IN AERO: 2.0000 | INHALATION_SPRAY | Freq: Two times a day (BID) | RESPIRATORY_TRACT | 3 refills | Status: DC

## 2017-06-06 MED ORDER — IBUPROFEN 600 MG PO TABS: 600.0000 mg | ORAL_TABLET | Freq: Three times a day (TID) | ORAL | 0 refills | Status: DC | PRN

## 2017-06-06 MED ORDER — TIZANIDINE HCL 2 MG PO CAPS: 2.0000 mg | ORAL_CAPSULE | Freq: Three times a day (TID) | ORAL | 0 refills | Status: DC

## 2017-06-06 NOTE — Assessment & Plan Note (Signed)
Chronic bilateral low back pain without sciatica - DG Lumbar Spine Complete; Future - tizanidine (ZANAFLEX) 2 MG capsule; Take 1 capsule (2 mg total) by mouth 3 (three) times daily.  Dispense: 30 capsule; Refill: 0 - ibuprofen (ADVIL,MOTRIN) 600 MG tablet; Take 1 tablet (600 mg total) by mouth every 8 (eight) hours as needed.  Dispense: 30 tablet; Refill: 0 F/u with Dr Katrinka BlazingSmith for further management

## 2017-06-06 NOTE — Telephone Encounter (Signed)
Patient advised of ashleighs note/instructions---he will try lower dosage and call back if not working

## 2017-06-06 NOTE — Patient Instructions (Addendum)
Please head downstairs for lab x-ray.  I have sent a new prescription for zanaflex to help with your pain. You may take 1 capsule (2mg ) by mouth up to three times a day as needed.  Id recommend you schedule an appointment with Dr. Katrinka BlazingSmith, our sports medicine doctor, for further management of your pain.  I'd like to see you back in about 1 month, to see how your pain is doing and recheck your blood pressure.  It was nice to meet you. Thanks for letting me take care of you today :)

## 2017-06-06 NOTE — Progress Notes (Signed)
Subjective:    Patient ID: Jacob [name], male    DOB: 1986/06/16, 31 y.o.   MRN: [id number]  HPI Jacob [name] is a 31 yo male Who presents today to establish care. He s transferring to me from another provider in the same clinic.   His chief complaint today is back pain. The pain is a dull to sharp ache across his mid to lower back bilaterally. The pain began about three years ago after lifting a heavy tire. He also reports a fall off a ladder onto his back about one year ago. His pain has been intermittent over the past three years but has become more constant over the past months. hes not had any imaging for the pain in the past. He has taken flexeril in the past with relief and tries heat and ice occasionally. His job is labor intensive with frequent heavy lifting and movement throughout the day.  He reports intermittent numbness and tingling in his right hand and bilateral lower extremities, mostly after sitting for long times. He denies fevers, rash, dysuria, urinary frequency or hesitancy, urinary or bowel incontinence, radiation into his legs.   Review of Systems  See HPI  Past Medical History:  Diagnosis Date  . ADHD (attention deficit hyperactivity disorder) evaluation   . Allergy   . Bipolar disorder [facility](HCC)      Social History   Socioeconomic History  . Marital status: Married    Spouse name: Not on file  . Number of children: 0  . Years of education: 6512  . Highest education level: Not on file  Social Needs  . Financial resource strain: Not on file  . Food insecurity - worry: Not on file  . Food insecurity - inability: Not on file  . Transportation needs - medical: Not on file  . Transportation needs - non-medical: Not on file  Occupational History  . Occupation: Asst Event organiserManager    Employer: [employer]  Tobacco Use  . Smoking status: Former Smoker    Packs/day: 0.00    Years: 9.00    Pack years: 0.00    Last attempt to quit: 12/2010    Years since quitting: 6.5    . Smokeless tobacco: Never Used  Substance and Sexual Activity  . Alcohol use: No    Comment: Rare  . Drug use: No  . Sexual activity: Yes    Partners: Female    Birth control/protection: Condom, Diaphragm  Other Topics Concern  . Not on file  Social History Narrative   [place]. Currently live in private residence with wife Jacob Duster([name]). 3 cats, 2 dogs.   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     History reviewed. No pertinent surgical history.  Family History  Problem Relation Age of Onset  . Cancer Mother     No Known Allergies  Current Outpatient Medications on File Prior to Visit  Medication Sig Dispense Refill  . albuterol (PROVENTIL HFA;VENTOLIN HFA) 108 (90 Base) MCG/ACT inhaler Inhale 2 puffs into the lungs every 6 (six) hours as needed for wheezing or shortness of breath. 1 Inhaler 2  . Cariprazine HCl (VRAYLAR) 1.5 MG CAPS Take 1 capsule (1.5 mg total) daily by mouth. 90 capsule 1  . divalproex (DEPAKOTE ER) 500 MG 24 hr tablet Take 3 tablets (1,500 mg total) by mouth daily. 270 tablet 3  . GuanFACINE HCl 3 MG TB24 Take 1 tablet (3 mg total) by mouth  2 (two) times daily. 6 mg total daily 180 tablet 1  . hydrOXYzine (ATARAX/VISTARIL) 25 MG tablet Take 1 tablet (25 mg total) by mouth every 6 (six) hours as needed for anxiety. 90 tablet 0  . lisdexamfetamine (VYVANSE) 70 MG capsule Take 1 capsule (70 mg total) by mouth daily. 90 capsule 0   No current facility-administered medications on file prior to visit.     BP 140/90 (BP Location: Left Arm, Patient Position: Sitting, Cuff Size: Large)   Pulse 89   Temp 98.9 F (37.2 C) (Oral)   Ht 5\' 7"  (1.702 m)   Wt 208 lb (94.3 kg)   SpO2 98%   BMI 32.58 kg/m       Objective:   Physical Exam  Constitutional: He is oriented to person, place, and time. He appears well-developed and well-nourished. No distress.  HENT:  Head: Normocephalic and  atraumatic.  Cardiovascular: Normal rate, regular rhythm, normal heart sounds and intact distal pulses.  Pulmonary/Chest: Effort normal and breath sounds normal.  Musculoskeletal:       Lumbar back: He exhibits tenderness. He exhibits normal range of motion, no swelling, no deformity and no spasm.  Negative straight leg raise.  Neurological: He is alert and oriented to person, place, and time. He has normal strength and normal reflexes. Coordination and gait normal.  Skin: Skin is warm and dry.  Psychiatric: He has a normal mood and affect. Judgment and thought content normal.       Assessment & Plan:  Influenza vaccine given today.  Elevated blood pressure reading- No elevated readings in past. He states he is anxious about his appointment today. He will return in 1 month for pain follow up and we will recheck his BP.

## 2017-06-11 ENCOUNTER — Other Ambulatory Visit: Payer: Self-pay

## 2017-06-11 DIAGNOSIS — G8929 Other chronic pain: Secondary | ICD-10-CM

## 2017-06-11 DIAGNOSIS — M545 Low back pain, unspecified: Secondary | ICD-10-CM

## 2017-06-12 ENCOUNTER — Ambulatory Visit (HOSPITAL_COMMUNITY)
Admission: RE | Admit: 2017-06-12 | Discharge: 2017-06-12 | Disposition: A | Discharge status: Home / Self Care | Payer: BLUE CROSS/BLUE SHIELD | Attending: Psychiatry | Admitting: Psychiatry

## 2017-06-12 ENCOUNTER — Other Ambulatory Visit: Payer: Self-pay

## 2017-06-12 ENCOUNTER — Encounter (HOSPITAL_COMMUNITY): Payer: Self-pay | Admitting: Psychiatry

## 2017-06-12 ENCOUNTER — Ambulatory Visit (INDEPENDENT_AMBULATORY_CARE_PROVIDER_SITE_OTHER): Payer: BLUE CROSS/BLUE SHIELD | Admitting: Psychiatry

## 2017-06-12 VITALS — BP 142/83 | HR 82 | Temp 98.1°F | Resp 16 | Wt 210.0 lb

## 2017-06-12 DIAGNOSIS — Z79899 Other long term (current) drug therapy: Secondary | ICD-10-CM

## 2017-06-12 DIAGNOSIS — F902 Attention-deficit hyperactivity disorder, combined type: Secondary | ICD-10-CM | POA: Diagnosis not present

## 2017-06-12 DIAGNOSIS — R4689 Other symptoms and signs involving appearance and behavior: Secondary | ICD-10-CM | POA: Diagnosis not present

## 2017-06-12 DIAGNOSIS — Z630 Problems in relationship with spouse or partner: Secondary | ICD-10-CM | POA: Diagnosis not present

## 2017-06-12 DIAGNOSIS — F6381 Intermittent explosive disorder: Secondary | ICD-10-CM | POA: Insufficient documentation

## 2017-06-12 DIAGNOSIS — F3181 Bipolar II disorder: Secondary | ICD-10-CM | POA: Diagnosis present

## 2017-06-12 DIAGNOSIS — F3162 Bipolar disorder, current episode mixed, moderate: Secondary | ICD-10-CM | POA: Diagnosis not present

## 2017-06-12 MED ORDER — LITHIUM CARBONATE ER 300 MG PO TBCR: 300.0000 mg | EXTENDED_RELEASE_TABLET | Freq: Two times a day (BID) | ORAL | 1 refills | Status: DC

## 2017-06-12 MED ORDER — LITHIUM CARBONATE ER 300 MG PO TBCR: 300.0000 mg | EXTENDED_RELEASE_TABLET | Freq: Two times a day (BID) | ORAL | 3 refills | Status: DC

## 2017-06-12 MED ORDER — CHLORPROMAZINE HCL 50 MG PO TABS: 50.0000 mg | ORAL_TABLET | Freq: Every day | ORAL | 1 refills | Status: DC | PRN
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
## 2017-06-12 NOTE — Patient Instructions (Signed)
Stop Vraylar  Start lithium 300 mg twice a day   Use Thorazine for anger/agitation  Continue depakote, intuniv, vyvanse
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
## 2017-06-13 ENCOUNTER — Telehealth (HOSPITAL_COMMUNITY): Payer: Self-pay | Admitting: Professional

## 2017-06-17 ENCOUNTER — Other Ambulatory Visit (HOSPITAL_COMMUNITY): Payer: Self-pay | Admitting: Psychiatry

## 2017-06-17 ENCOUNTER — Encounter (HOSPITAL_COMMUNITY): Payer: Self-pay | Admitting: Psychiatry

## 2017-06-17 DIAGNOSIS — F902 Attention-deficit hyperactivity disorder, combined type: Secondary | ICD-10-CM

## 2017-06-17 MED ORDER — GUANFACINE HCL ER 3 MG PO TB24: 3.0000 mg | ORAL_TABLET | Freq: Two times a day (BID) | ORAL | 1 refills | Status: DC

## 2017-06-18 ENCOUNTER — Other Ambulatory Visit: Payer: Self-pay

## 2017-06-18 DIAGNOSIS — G8929 Other chronic pain: Secondary | ICD-10-CM

## 2017-06-18 DIAGNOSIS — M545 Low back pain: Principal | ICD-10-CM

## 2017-06-18 MED ORDER — IBUPROFEN 600 MG PO TABS: 600.0000 mg | ORAL_TABLET | Freq: Three times a day (TID) | ORAL | 0 refills | Status: DC | PRN

## 2017-06-18 MED ORDER — TIZANIDINE HCL 2 MG PO CAPS: 2.0000 mg | ORAL_CAPSULE | Freq: Three times a day (TID) | ORAL | 0 refills | Status: DC

## 2017-06-20 ENCOUNTER — Encounter (HOSPITAL_COMMUNITY): Payer: Self-pay | Admitting: Psychiatry

## 2017-06-20 ENCOUNTER — Ambulatory Visit (INDEPENDENT_AMBULATORY_CARE_PROVIDER_SITE_OTHER): Payer: BLUE CROSS/BLUE SHIELD | Admitting: Psychiatry

## 2017-06-20 VITALS — BP 142/88 | HR 114 | Ht 67.0 in | Wt 209.0 lb

## 2017-06-20 DIAGNOSIS — F3162 Bipolar disorder, current episode mixed, moderate: Secondary | ICD-10-CM | POA: Diagnosis not present

## 2017-06-20 DIAGNOSIS — Z87891 Personal history of nicotine dependence: Secondary | ICD-10-CM | POA: Diagnosis not present

## 2017-06-20 DIAGNOSIS — F6381 Intermittent explosive disorder: Secondary | ICD-10-CM | POA: Diagnosis not present

## 2017-06-20 DIAGNOSIS — F902 Attention-deficit hyperactivity disorder, combined type: Secondary | ICD-10-CM | POA: Diagnosis not present

## 2017-06-20 DIAGNOSIS — R4689 Other symptoms and signs involving appearance and behavior: Secondary | ICD-10-CM | POA: Diagnosis not present

## 2017-06-20 DIAGNOSIS — Z79899 Other long term (current) drug therapy: Secondary | ICD-10-CM | POA: Diagnosis not present

## 2017-06-20 MED ORDER — DIVALPROEX SODIUM ER 500 MG PO TB24: 1500.0000 mg | ORAL_TABLET | Freq: Every day | ORAL | 3 refills | Status: DC

## 2017-06-20 MED ORDER — LISDEXAMFETAMINE DIMESYLATE 70 MG PO CAPS: 70.0000 mg | ORAL_CAPSULE | Freq: Every day | ORAL | 0 refills | Status: DC

## 2017-06-20 MED ORDER — LITHIUM CARBONATE ER 450 MG PO TBCR: 450.0000 mg | EXTENDED_RELEASE_TABLET | Freq: Two times a day (BID) | ORAL | 1 refills | Status: DC
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
## 2017-06-24 ENCOUNTER — Encounter (HOSPITAL_COMMUNITY): Payer: Self-pay | Admitting: Psychiatry

## 2017-06-24 LAB — CBC WITH DIFFERENTIAL/PLATELET
BASOS ABS: 0 10*3/uL (ref 0.0–0.2)
Basos: 0 %
EOS (ABSOLUTE): 0.5 10*3/uL — ABNORMAL HIGH (ref 0.0–0.4)
EOS: 6 %
HEMATOCRIT: 48.5 % (ref 37.5–51.0)
HEMOGLOBIN: 16.5 g/dL (ref 13.0–17.7)
Immature Grans (Abs): 0 10*3/uL (ref 0.0–0.1)
Immature Granulocytes: 0 %
LYMPHS ABS: 2.2 10*3/uL (ref 0.7–3.1)
Lymphs: 27 %
MCH: 33.7 pg — AB (ref 26.6–33.0)
MCHC: 34 g/dL (ref 31.5–35.7)
MCV: 99 fL — ABNORMAL HIGH (ref 79–97)
MONOCYTES: 6 %
MONOS ABS: 0.5 10*3/uL (ref 0.1–0.9)
NEUTROS ABS: 5.1 10*3/uL (ref 1.4–7.0)
Neutrophils: 61 %
Platelets: 303 10*3/uL (ref 150–379)
RBC: 4.9 x10E6/uL (ref 4.14–5.80)
RDW: 13.6 % (ref 12.3–15.4)
WBC: 8.2 10*3/uL (ref 3.4–10.8)

## 2017-06-24 LAB — COMPREHENSIVE METABOLIC PANEL
ALBUMIN: 4.6 g/dL (ref 3.5–5.5)
ALK PHOS: 44 IU/L (ref 39–117)
ALT: 22 IU/L (ref 0–44)
AST: 32 IU/L (ref 0–40)
Albumin/Globulin Ratio: 2.2 (ref 1.2–2.2)
BILIRUBIN TOTAL: 0.5 mg/dL (ref 0.0–1.2)
BUN / CREAT RATIO: 9 (ref 9–20)
BUN: 9 mg/dL (ref 6–20)
CHLORIDE: 105 mmol/L (ref 96–106)
CO2: 16 mmol/L — ABNORMAL LOW (ref 20–29)
Calcium: 9.7 mg/dL (ref 8.7–10.2)
Creatinine, Ser: 1.03 mg/dL (ref 0.76–1.27)
GFR calc Af Amer: 112 mL/min/{1.73_m2} (ref >59)
GFR calc non Af Amer: 97 mL/min/{1.73_m2} (ref >59)
GLOBULIN, TOTAL: 2.1 g/dL (ref 1.5–4.5)
Glucose: 62 mg/dL — ABNORMAL LOW (ref 65–99)
POTASSIUM: 5.8 mmol/L — AB (ref 3.5–5.2)
SODIUM: 145 mmol/L — AB (ref 134–144)
Total Protein: 6.7 g/dL (ref 6.0–8.5)

## 2017-06-24 LAB — LITHIUM LEVEL: LITHIUM LVL: 0.5 mmol/L — AB (ref 0.6–1.2)

## 2017-06-24 LAB — VALPROIC ACID LEVEL: VALPROIC ACID LVL: 80 ug/mL (ref 50–100)

## 2017-06-24 LAB — T4, FREE: Free T4: 0.98 ng/dL (ref 0.82–1.77)

## 2017-06-26 LAB — TSH: TSH: 2.2 u[IU]/mL (ref 0.450–4.500)

## 2017-06-27 ENCOUNTER — Other Ambulatory Visit: Payer: Self-pay

## 2017-06-27 ENCOUNTER — Ambulatory Visit (INDEPENDENT_AMBULATORY_CARE_PROVIDER_SITE_OTHER): Payer: BLUE CROSS/BLUE SHIELD | Admitting: Family Medicine

## 2017-06-27 ENCOUNTER — Ambulatory Visit (HOSPITAL_COMMUNITY): Payer: BLUE CROSS/BLUE SHIELD

## 2017-06-27 ENCOUNTER — Encounter (HOSPITAL_COMMUNITY): Payer: Self-pay | Admitting: Psychiatry

## 2017-06-27 ENCOUNTER — Encounter: Payer: Self-pay | Admitting: Family Medicine

## 2017-06-27 DIAGNOSIS — M545 Low back pain, unspecified: Secondary | ICD-10-CM

## 2017-06-27 DIAGNOSIS — F3162 Bipolar disorder, current episode mixed, moderate: Secondary | ICD-10-CM

## 2017-06-27 DIAGNOSIS — G8929 Other chronic pain: Secondary | ICD-10-CM | POA: Diagnosis not present

## 2017-06-27 DIAGNOSIS — Z79899 Other long term (current) drug therapy: Secondary | ICD-10-CM

## 2017-06-27 MED ORDER — DICLOFENAC SODIUM 2 % TD SOLN: 2.0000 g | Freq: Two times a day (BID) | TRANSDERMAL | 3 refills | Status: DC

## 2017-06-27 MED ORDER — VITAMIN D (ERGOCALCIFEROL) 1.25 MG (50000 UNIT) PO CAPS: 50000.0000 [IU] | ORAL_CAPSULE | ORAL | 0 refills | Status: DC

## 2017-06-27 NOTE — Assessment & Plan Note (Signed)
Patient's low back pain seems to be more multifactorial.  I do believe that some of patient's medications increase the likelihood of dehydration.  Encourage patient to do this.  Once weekly vitamin D secondary to likely some absorption problems, this can help with muscle strength and endurance, home exercises given for muscle imbalances.  Work with Event organiserathletic trainer.  Patient also given topical anti-inflammatories.  Warned of potential interaction with the lithium but significantly less likely than oral.  Patient will try this very intermittently.  Patient then will follow up with me again in 4 weeks and will see how patient is responding.

## 2017-06-27 NOTE — Patient Instructions (Addendum)
Good to see you  Ice is your friend Ice 20 minutes 2 times daily. Usually after activity and before bed. pennsaid pinkie amount topically 2 times daily as needed. You should be fine with your lithium  Once weekly vitamin D for 12 weeks Over the counter get  DHEA 25 mg daily for 4 weeks.  B complex to take daily  See me again in 4 weeks and we will hope you are making progress.

## 2017-06-27 NOTE — Progress Notes (Signed)
[name] [facility] [address] [place], [place] [address] Phone: [phone] Subjective:    I'm seeing this patient by the request  of:  [name], [name], NP   CC: Back pain  YQM:VHQIONGEXBHPI:Subjective  Jacob [name] is a 31 y.o. male coming in with complaint of back pain. He fell off a later. Pain varies depending on how he moves but the center of his back is the worse. He gets numbness and tingling after sitting on the floor for about 2 mins. His feet goes numb when he gives his daughter a bath. Also feels it when crossing his legs. 20 mgs of flexeril relieves his pain.   Onset-  1 year ago Location- Thoracic  Duration- At night after a long day of work pain is at its worse  Character- Dull. With movement sharp Aggravating factors- Twisting, Sitting Reliving factors- Flexeril (20 mg), Zanaflex (2 mg) Therapies tried-  Severity-7 out of 10   Patient did have x-rays at last exam by primary care.  These were independently visualized by me showing no significant bony normality.  Past Medical History:  Diagnosis Date  . ADHD (attention deficit hyperactivity disorder) evaluation   . Allergy   . Bipolar disorder (HCC)    No past surgical history on file. Social History   Socioeconomic History  . Marital status: Married    Spouse name: None  . Number of children: 0  . Years of education: 5412  . Highest education level: None  Social Needs  . Financial resource strain: None  . Food insecurity - worry: None  . Food insecurity - inability: None  . Transportation needs - medical: None  . Transportation needs - non-medical: None  Occupational History  . Occupation: Asst Event organiserManager    Employer: [employer]  Tobacco Use  . Smoking status: Former Smoker    Packs/day: 0.00    Years: 9.00    Pack years: 0.00    Last attempt to quit: 12/2010    Years since quitting: 6.5  . Smokeless tobacco: Never Used  Substance and Sexual Activity  . Alcohol use: No   Comment: Rare  . Drug use: No  . Sexual activity: Yes    Partners: Female    Birth control/protection: Condom, Diaphragm  Other Topics Concern  . None  Social History Narrative   Born [place] raised in [place]. Currently live in private residence with wife Jacob [name]([name]). 3 cats, 2 dogs.   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    No Known Allergies Family History  Problem Relation Age of Onset  . Cancer Mother      Past medical history, social, surgical and family history all reviewed in electronic medical record.  No pertanent information unless stated regarding to the chief complaint.   Review of Systems:Review of systems updated and as accurate as of 06/27/17  No headache, visual changes, nausea, vomiting, diarrhea, constipation, dizziness, abdominal pain, skin rash, fevers, chills, night sweats, weight loss, swollen lymph nodes, body aches, joint swelling,  chest pain, shortness of breath, mood changes.  + Muscle aches  Objective  Blood pressure (!) 144/90, pulse 74, height 5\' 7"  (1.702 m), weight 216 lb (98 kg), SpO2 98 %. Systems examined below as of 06/27/17   General: No apparent distress alert and oriented x3 mood and affect normal, dressed appropriately.  HEENT: Pupils equal, extraocular movements intact  Respiratory: Patient's speak in full sentences and does not appear  short of breath  Cardiovascular: No lower extremity edema, non tender, no erythema  Skin: Warm dry intact with no signs of infection or rash on extremities or on axial skeleton.  Abdomen: Soft nontender  Neuro: Cranial nerves II through XII are intact, neurovascularly intact in all extremities with 2+ DTRs and 2+ pulses.  Lymph: No lymphadenopathy of posterior or anterior cervical chain or axillae bilaterally.  Gait normal with good balance and coordination.  MSK:  Non tender with full range of motion and good stability and symmetric strength and tone of  shoulders, elbows, wrist, hip, knee and ankles bilaterally.  Back Exam:  Inspection: Mild loss of lordosis. Motion: Flexion 35 deg, Extension 25 deg, Side Bending to 35 deg bilaterally,  Rotation to 35 deg bilaterally  SLR laying: Negative.  Tightness of the hamstrings bilaterally XSLR laying: Negative  Palpable tenderness: Tender to palpation in the paraspinal musculature lumbar spine. FABER: Tightness bilaterally. Sensory change: Gross sensation intact to all lumbar and sacral dermatomes.  Reflexes: 2+ at both patellar tendons, 2+ at achilles tendons, Babinski's downgoing.  Strength at foot  Plantar-flexion: 5/5 Dorsi-flexion: 5/5 Eversion: 5/5 Inversion: 5/5  Leg strength  Quad: 5/5 Hamstring: 5/5 Hip flexor: 5/5 Hip abductors: 4/5 but symmetric Gait unremarkable.  [id number]; 15 additional minutes spent for Therapeutic exercises as stated in above notes.  This included exercises focusing on stretching, strengthening, with significant focus on eccentric aspects.   Long term goals include an improvement in range of motion, strength, endurance as well as avoiding reinjury. Patient's frequency would include in 1-2 times a day, 3-5 times a week for a duration of 6-12 weeks. Low back exercises that included:  Pelvic tilt/bracing instruction to focus on control of the pelvic girdle and lower abdominal muscles  Glute strengthening exercises, focusing on proper firing of the glutes without engaging the low back muscles Proper stretching techniques for maximum relief for the hamstrings, hip flexors, low back and some rotation where tolerated    Proper technique shown and discussed handout in great detail with ATC.  All questions were discussed and answered.     Impression and Recommendations:     This case required medical decision making of moderate complexity.      Note: This dictation was prepared with Dragon dictation along with smaller phrase technology. Any transcriptional errors that  result from this process are unintentional.

## 2017-07-01 LAB — LITHIUM LEVEL: Lithium Lvl: 0.9 mmol/L (ref 0.6–1.2)

## 2017-07-11 ENCOUNTER — Encounter: Payer: Self-pay | Admitting: Nurse Practitioner

## 2017-07-11 ENCOUNTER — Ambulatory Visit: Payer: BLUE CROSS/BLUE SHIELD | Admitting: Nurse Practitioner

## 2017-07-11 VITALS — BP 144/84 | HR 79 | Temp 99.0°F | Resp 16 | Ht 67.0 in | Wt 220.0 lb

## 2017-07-11 DIAGNOSIS — R05 Cough: Secondary | ICD-10-CM | POA: Diagnosis not present

## 2017-07-11 DIAGNOSIS — J45901 Unspecified asthma with (acute) exacerbation: Secondary | ICD-10-CM

## 2017-07-11 DIAGNOSIS — I10 Essential (primary) hypertension: Secondary | ICD-10-CM | POA: Diagnosis not present

## 2017-07-11 DIAGNOSIS — R059 Cough, unspecified: Secondary | ICD-10-CM

## 2017-07-11 MED ORDER — ALBUTEROL SULFATE HFA 108 (90 BASE) MCG/ACT IN AERS: 2.0000 | INHALATION_SPRAY | Freq: Four times a day (QID) | RESPIRATORY_TRACT | 2 refills | Status: DC | PRN

## 2017-07-11 MED ORDER — AMLODIPINE BESYLATE 5 MG PO TABS: 5.0000 mg | ORAL_TABLET | Freq: Every day | ORAL | 3 refills | Status: DC

## 2017-07-11 MED ORDER — METHYLPREDNISOLONE ACETATE 80 MG/ML IJ SUSP
80.0000 mg | Freq: Once | INTRAMUSCULAR | Status: AC
  Administered 2017-07-11: 80 mg via INTRAMUSCULAR

## 2017-07-11 MED ORDER — AMLODIPINE BESYLATE 5 MG PO TABS: 5.0000 mg | ORAL_TABLET | Freq: Every day | ORAL | 0 refills | Status: DC

## 2017-07-11 NOTE — Progress Notes (Signed)
Subjective:    Patient ID: Jacob [name], male    DOB: Oct 20, 1985, 32 y.o.   MRN: [id number]  HPI Mr. Melynda RippleHobbs presents today for a follow up visit of back pain and high blood pressure reading. He was seen in the clinic on 11/30 for lower back pain with a blood pressure reading of 140/90 that day. He said he had not been told of elevated blood pressure readings prior to that visit, but that he was feeling anxious about his appointment that day and felt that was causing his BP to increase. A DG lumbar spine and zanaflex prn were ordered for his low back pain and he was referred to Dr Katrinka BlazingSmith, sports medicine in our clinic for further workup of back pain. He was instructed to return in about a month for follow up of his pain and blood pressure.  He saw Dr Katrinka BlazingSmith on 12/21 for his back pain and again had an elevated blood pressure reading. He was given topical antiinflammatories, instructions for OTC supplements and home exercise and he will see Dr Katrinka BlazingSmith back in 4 weeks for another follow up of his back pain.  His blood pressure remains elevated today. He also had a high readings 140/high 80s outside of the clinic in the past month. He does have intermittent headaches, relieved with rest or OTC medication. he has also noticed some mild dizziness since his psychiatrist started him on lithium, but this does seem to be getting better over time. He also reports a productive cough with white sputum over the past month. he says that he gets "coughing spells" where he can not catch his breath and starts wheezing- these spells are occurring a couple of times per week. He has especially noticed these spells after exposure to fumes or strong smells. He has continued to use his symbicort daily and his albuterol as needed for his "coughing spells." he says his cough does not seem to be getting better. He denies fevers, malaise, weakness, syncope, vision changes, chest pain, palpitations, edema.  BP Readings from Last 3  Encounters:  07/11/17 (!) 144/84  06/27/17 (!) 144/90  06/06/17 (!) 142/90    Review of Systems  See HPI  Past Medical History:  Diagnosis Date  . ADHD (attention deficit hyperactivity disorder) evaluation   . Allergy   . Bipolar disorder [facility](HCC)      Social History   Socioeconomic History  . Marital status: Married    Spouse name: Not on file  . Number of children: 0  . Years of education: 4612  . Highest education level: Not on file  Social Needs  . Financial resource strain: Not on file  . Food insecurity - worry: Not on file  . Food insecurity - inability: Not on file  . Transportation needs - medical: Not on file  . Transportation needs - non-medical: Not on file  Occupational History  . Occupation: Asst Event organiserManager    Employer: [employer]  Tobacco Use  . Smoking status: Former Smoker    Packs/day: 0.00    Years: 9.00    Pack years: 0.00    Last attempt to quit: 12/2010    Years since quitting: 6.5  . Smokeless tobacco: Never Used  Substance and Sexual Activity  . Alcohol use: No    Comment: Rare  . Drug use: No  . Sexual activity: Yes    Partners: Female    Birth control/protection: Condom, Diaphragm  Other Topics Concern  . Not on file  Social History Narrative   Born and raised in [place]. Currently live in private residence with wife [name]). 3 cats, 2 dogs.   Fun: Engineer, petroleum   Denies religious beliefs that would effect healthcare.     No past surgical history on file.  Family History  Problem Relation Age of Onset  . Cancer Mother     No Known Allergies  Current Outpatient Medications on File Prior to Visit  Medication Sig Dispense Refill  . albuterol (PROVENTIL HFA;VENTOLIN HFA) 108 (90 Base) MCG/ACT inhaler Inhale 2 puffs into the lungs every 6 (six) hours as needed for wheezing or shortness of breath. 1 Inhaler 2  . budesonide-formoterol (SYMBICORT) 160-4.5 MCG/ACT inhaler Inhale 2 puffs into the lungs 2 (two) times  daily. Must keep appt w/new provider for future refills 3 Inhaler 3  . chlorproMAZINE (THORAZINE) 50 MG tablet Take 1 tablet (50 mg total) by mouth daily as needed (for agitation). 90 tablet 1  . Diclofenac Sodium (PENNSAID) 2 % SOLN Place 2 g onto the skin 2 (two) times daily. 112 g 3  . divalproex (DEPAKOTE ER) 500 MG 24 hr tablet Take 3 tablets (1,500 mg total) by mouth daily. 270 tablet 3  . GuanFACINE HCl 3 MG TB24 Take 1 tablet (3 mg total) by mouth 2 (two) times daily. 6 mg total daily 180 tablet 1  . lisdexamfetamine (VYVANSE) 70 MG capsule Take 1 capsule (70 mg total) by mouth daily. 90 capsule 0  . lithium carbonate (ESKALITH) 450 MG CR tablet Take 1 tablet (450 mg total) by mouth 2 (two) times daily. 180 tablet 1  . tizanidine (ZANAFLEX) 2 MG capsule Take 1 capsule (2 mg total) by mouth 3 (three) times daily. 90 capsule 0  . Vitamin D, Ergocalciferol, (DRISDOL) 50000 units CAPS capsule Take 1 capsule (50,000 Units total) by mouth every 7 (seven) days. 12 capsule 0   No current facility-administered medications on file prior to visit.     BP (!) 144/84 (BP Location: Left Arm, Patient Position: Sitting, Cuff Size: Large)   Pulse 79   Temp 99 F (37.2 C) (Oral)   Resp 16   Ht 5\' 7"  (1.702 m)   Wt 220 lb (99.8 kg)   SpO2 98%   BMI 34.46 kg/m        Objective:   Physical Exam  Constitutional: He is oriented to person, place, and time. He appears well-developed and well-nourished. No distress.  HENT:  Head: Normocephalic and atraumatic.  Cardiovascular: Normal rate, regular rhythm, normal heart sounds and intact distal pulses.  Pulmonary/Chest: Effort normal and breath sounds normal. No respiratory distress.  Neurological: He is alert and oriented to person, place, and time. Coordination normal.  Skin: Skin is warm and dry.  Psychiatric: He has a normal mood and affect. Judgment and thought content normal.      Assessment & Plan:    Moderate asthma with acute  exacerbation, unspecified whether persistent, Cough Cough x 1 month, not improving. Lungs are clear on PE today and he is without fevers, malaise. He declines chest x-ray today. Will continue symbicort, albuterol and give prednisone injection in clinic today. - albuterol (PROVENTIL HFA;VENTOLIN HFA) 108 (90 Base) MCG/ACT inhaler; Inhale 2 puffs into the lungs every 6 (six) hours as needed for wheezing or shortness of breath.  Dispense: 1 Inhaler; Refill: 2 - methylPREDNISolone acetate (DEPO-MEDROL) injection 80 mg We discussed the importance of following up if no improvement. RTC in 2 weeks for recheck

## 2017-07-11 NOTE — Assessment & Plan Note (Signed)
Multiple elevated readings now. We discussed initiating medication for high blood pressure at this point and he agrees. We also discussed diet and exercise in the role of BP reduction. Medications ordered: - amLODipine (NORVASC) 5 MG tablet; Take 1 tablet (5 mg total) by mouth daily.  Dispense: 90 tablet; Refill: 3 Discussed side effects. RTC in about 2 weeks for recheck.

## 2017-07-11 NOTE — Patient Instructions (Addendum)
Please start amlodipine 5mg  daily for your blood pressure. Our goal is to get your blood pressure below 140/90 at all times.  For your cough, continue your symbicort daily. I would like for you to use your albuterol 2 puffs every 6 hours for the next three days, then return to normal use (as needed only). If you develop fevers, worsening shortness of breath, cough with bloody or yellow sputum, or start to feel worse please let me me know.  Id like for you to come back in about 2 weeks, we can see how your cough is doing and recheck your blood pressure.  It was nice to see you. Thanks for letting me take care of you today :)

## 2017-07-14 ENCOUNTER — Other Ambulatory Visit: Payer: Self-pay

## 2017-07-14 DIAGNOSIS — G8929 Other chronic pain: Secondary | ICD-10-CM

## 2017-07-14 DIAGNOSIS — M545 Low back pain: Principal | ICD-10-CM

## 2017-07-14 MED ORDER — TIZANIDINE HCL 2 MG PO CAPS: 2.0000 mg | ORAL_CAPSULE | Freq: Three times a day (TID) | ORAL | 0 refills | Status: DC

## 2017-07-24 ENCOUNTER — Ambulatory Visit: Payer: Self-pay | Admitting: Nurse Practitioner

## 2017-07-25 ENCOUNTER — Ambulatory Visit: Payer: Self-pay | Admitting: Family Medicine

## 2017-07-30 NOTE — Progress Notes (Signed)
[name] [address] [place], [place] [address] Phone: [phone] Subjective:     CC: Low back pain follow-up  BJY:[id number]  Jacob [name] is a 32 y.o. male coming in with complaint of low back pain.  Seem to be multifactorial.  Patient was started on vitamin D, over-the-counter medications, and icing regimen.  Home exercises given.  Patient did have x-rays that were independently visualized by me showing no bony abnormality.  Patient states 50% better.  Not having as much tightness.  Feels like he is making progress.  Doing the exercises intermittently.    Past Medical History:  Diagnosis Date  . ADHD (attention deficit hyperactivity disorder) evaluation   . Allergy   . Bipolar disorder (HCC)    No past surgical history on file. Social History   Socioeconomic History  . Marital status: Married    Spouse name: Not on file  . Number of children: 0  . Years of education: 36  . Highest education level: Not on file  Social Needs  . Financial resource strain: Not on file  . Food insecurity - worry: Not on file  . Food insecurity - inability: Not on file  . Transportation needs - medical: Not on file  . Transportation needs - non-medical: Not on file  Occupational History  . Occupation: Asst Event organiser: [employer]  Tobacco Use  . Smoking status: Former Smoker    Packs/day: 0.00    Years: 9.00    Pack years: 0.00    Last attempt to quit: 12/2010    Years since quitting: 6.6  . Smokeless tobacco: Never Used  Substance and Sexual Activity  . Alcohol use: No    Comment: Rare  . Drug use: No  . Sexual activity: Yes    Partners: Female    Birth control/protection: Condom, Diaphragm  Other Topics Concern  . Not on file  Social History Narrative   Born and raised in [place]. Currently live in private residence with wife [name]). 3 cats, 2 dogs.   Fun: Engineer, petroleum   Denies religious beliefs that would  effect healthcare.    No Known Allergies Family History  Problem Relation Age of Onset  . Cancer Mother      Past medical history, social, surgical and family history all reviewed in electronic medical record.  No pertanent information unless stated regarding to the chief complaint.   Review of Systems:Review of systems updated and as accurate as of 07/30/17  No headache, visual changes, nausea, vomiting, diarrhea, constipation, dizziness, abdominal pain, skin rash, fevers, chills, night sweats, weight loss, swollen lymph nodes, body aches, joint swelling,, chest pain, shortness of breath, mood changes.  Mild positive muscle aches  Objective  There were no vitals taken for this visit. Systems examined below as of 07/30/17   General: No apparent distress alert and oriented x3 mood and affect normal, dressed appropriately.  HEENT: Pupils equal, extraocular movements intact  Respiratory: Patient's speak in full sentences and does not appear short of breath  Cardiovascular: No lower extremity edema, non tender, no erythema  Skin: Warm dry intact with no signs of infection or rash on extremities or on axial skeleton.  Abdomen: Soft nontender  Neuro: Cranial nerves II through XII are intact, neurovascularly intact in all extremities with 2+ DTRs and 2+ pulses.  Lymph: No lymphadenopathy of posterior or anterior cervical chain or axillae bilaterally.  Gait normal with good balance and coordination.  MSK:  Non tender with full range of motion and good stability and symmetric strength and tone of shoulders, elbows, wrist, hip, knee and ankles bilaterally.  Back Exam:  Inspection: Unremarkable  Motion: Flexion 45 deg, Extension 25 deg, Side Bending to 35 deg bilaterally,  Rotation to 40 deg bilaterally  SLR laying: Negative  XSLR laying: Negative  Palpable tenderness: Is in the thoracolumbar juncture. FABER: Mild positive on the left. Sensory change: Gross sensation intact to all lumbar and  sacral dermatomes.  Reflexes: 2+ at both patellar tendons, 2+ at achilles tendons, Babinski's downgoing.  Strength at foot  Plantar-flexion: 5/5 Dorsi-flexion: 5/5 Eversion: 5/5 Inversion: 5/5  Leg strength  Quad: 5/5 Hamstring: 5/5 Hip flexor: 5/5 Hip abductors: 5/5  Gait unremarkable.  Osteopathic findings C3 flexed rotated and side bent right  C6 flexed rotated and side bent left T3 extended rotated and side bent right inhaled third rib T11 extended rotated and side bent left L2 flexed rotated and side bent right Sacrum left on left      Impression and Recommendations:     This case required medical decision making of moderate complexity.      Note: This dictation was prepared with Dragon dictation along with smaller phrase technology. Any transcriptional errors that result from this process are unintentional.

## 2017-07-31 ENCOUNTER — Ambulatory Visit: Payer: BLUE CROSS/BLUE SHIELD | Admitting: Family Medicine

## 2017-07-31 ENCOUNTER — Ambulatory Visit: Payer: BLUE CROSS/BLUE SHIELD | Admitting: Nurse Practitioner

## 2017-07-31 ENCOUNTER — Other Ambulatory Visit (INDEPENDENT_AMBULATORY_CARE_PROVIDER_SITE_OTHER): Payer: BLUE CROSS/BLUE SHIELD

## 2017-07-31 ENCOUNTER — Encounter: Payer: Self-pay | Admitting: Nurse Practitioner

## 2017-07-31 ENCOUNTER — Encounter: Payer: Self-pay | Admitting: Family Medicine

## 2017-07-31 VITALS — BP 142/90 | HR 81 | Ht 67.0 in | Wt 216.0 lb

## 2017-07-31 VITALS — BP 142/82 | HR 81 | Resp 16 | Ht 67.0 in | Wt 216.0 lb

## 2017-07-31 DIAGNOSIS — M999 Biomechanical lesion, unspecified: Secondary | ICD-10-CM

## 2017-07-31 DIAGNOSIS — I10 Essential (primary) hypertension: Secondary | ICD-10-CM

## 2017-07-31 DIAGNOSIS — M545 Low back pain: Secondary | ICD-10-CM | POA: Diagnosis not present

## 2017-07-31 DIAGNOSIS — G8929 Other chronic pain: Secondary | ICD-10-CM

## 2017-07-31 DIAGNOSIS — J45909 Unspecified asthma, uncomplicated: Secondary | ICD-10-CM | POA: Diagnosis not present

## 2017-07-31 LAB — BASIC METABOLIC PANEL
BUN: 14 mg/dL (ref 6–23)
CO2: 29 mEq/L (ref 19–32)
CREATININE: 1.08 mg/dL (ref 0.40–1.50)
Calcium: 10.1 mg/dL (ref 8.4–10.5)
Chloride: 103 mEq/L (ref 96–112)
GFR: 85.02 mL/min (ref >60.00)
GLUCOSE: 98 mg/dL (ref 70–99)
POTASSIUM: 4.9 meq/L (ref 3.5–5.1)
Sodium: 138 mEq/L (ref 135–145)

## 2017-07-31 MED ORDER — LOSARTAN POTASSIUM 50 MG PO TABS: 50.0000 mg | ORAL_TABLET | Freq: Every day | ORAL | 3 refills | Status: DC

## 2017-07-31 NOTE — Patient Instructions (Signed)
Good to see you  Jacob [name] is your friend.  Stay active.  Tried manipulation today and should help  Continue the vitamins See me again in 5-6 weeks

## 2017-07-31 NOTE — Assessment & Plan Note (Signed)
I believe that this is muscular in nature.  X-rays were normal.  Started osteopathic manipulation in addition to the B.  Patient did have a good response immediately.  We discussed other core strengthening eccentric as well as isometric exercises that I think will be beneficial.  Discussed lifting mechanics.  Follow-up with me again in 4-6 weeks

## 2017-07-31 NOTE — Patient Instructions (Addendum)
Please head downstairs for lab work.  Start losartan 50mg  daily for your blood pressure. Please try to check your blood pressure once daily or at least a few times a week, at the same time each day, and keep a log.  It was nice to see you. Thanks for letting me take care of you today :)  Mediterranean Diet A Mediterranean diet refers to food and lifestyle choices that are based on the traditions of countries located on the [employer]. This way of eating has been shown to help prevent certain conditions and improve outcomes for people who have chronic diseases, like kidney disease and heart disease. What are tips for following this plan? Lifestyle  Cook and eat meals together with your family, when possible.  Drink enough fluid to keep your urine clear or pale yellow.  Be physically active every day. This includes: ? Aerobic exercise like running or swimming. ? Leisure activities like gardening, walking, or housework.  Get 7-8 hours of sleep each night.  If recommended by your health care provider, drink red wine in moderation. This means 1 glass a day for nonpregnant women and 2 glasses a day for men. A glass of wine equals 5 oz (150 mL). Reading food labels  Check the serving size of packaged foods. For foods such as rice and pasta, the serving size refers to the amount of cooked product, not dry.  Check the total fat in packaged foods. Avoid foods that have saturated fat or trans fats.  Check the ingredients list for added sugars, such as corn syrup. Shopping  At the grocery store, buy most of your food from the areas near the walls of the store. This includes: ? Fresh fruits and vegetables (produce). ? Grains, beans, nuts, and seeds. Some of these may be available in unpackaged forms or large amounts (in bulk). ? Fresh seafood. ? Poultry and eggs. ? Low-fat dairy products.  Buy whole ingredients instead of prepackaged foods.  Buy fresh fruits and vegetables  in-season from local farmers markets.  Buy frozen fruits and vegetables in resealable bags.  If you do not have access to quality fresh seafood, buy precooked frozen shrimp or canned fish, such as tuna, salmon, or sardines.  Buy small amounts of raw or cooked vegetables, salads, or olives from the deli or salad bar at your store.  Stock your pantry so you always have certain foods on hand, such as olive oil, canned tuna, canned tomatoes, rice, pasta, and beans. Cooking  Cook foods with extra-virgin olive oil instead of using butter or other vegetable oils.  Have meat as a side dish, and have vegetables or grains as your main dish. This means having meat in small portions or adding small amounts of meat to foods like pasta or stew.  Use beans or vegetables instead of meat in common dishes like chili or lasagna.  Experiment with different cooking methods. Try roasting or broiling vegetables instead of steaming or sauteing them.  Add frozen vegetables to soups, stews, pasta, or rice.  Add nuts or seeds for added healthy fat at each meal. You can add these to yogurt, salads, or vegetable dishes.  Marinate fish or vegetables using olive oil, lemon juice, garlic, and fresh herbs. Meal planning  Plan to eat 1 vegetarian meal one day each week. Try to work up to 2 vegetarian meals, if possible.  Eat seafood 2 or more times a week.  Have healthy snacks readily available, such as: ? Vegetable sticks with  hummus. ? [place] yogurt. ? Fruit and nut trail mix.  Eat balanced meals throughout the week. This includes: ? Fruit: 2-3 servings a day ? Vegetables: 4-5 servings a day ? Low-fat dairy: 2 servings a day ? Fish, poultry, or lean meat: 1 serving a day ? Beans and legumes: 2 or more servings a week ? Nuts and seeds: 1-2 servings a day ? Whole grains: 6-8 servings a day ? Extra-virgin olive oil: 3-4 servings a day  Limit red meat and sweets to only a few servings a month What are my  food choices?  Mediterranean diet ? Recommended ? Grains: Whole-grain pasta. Brown rice. Bulgar wheat. Polenta. Couscous. Whole-wheat bread. Orpah Cobbatmeal. Quinoa. ? Vegetables: Artichokes. Beets. Broccoli. Cabbage. Carrots. Eggplant. Green beans. Chard. Kale. Spinach. Onions. Leeks. Peas. Squash. Tomatoes. Peppers. Radishes. ? Fruits: Apples. Apricots. Avocado. Berries. Bananas. Cherries. Dates. Figs. Grapes. Lemons. Melon. Oranges. Peaches. Plums. Pomegranate. ? Meats and other protein foods: Beans. Almonds. Sunflower seeds. Pine nuts. Peanuts. Cod. Salmon. Scallops. Shrimp. Tuna. Tilapia. Clams. Oysters. Eggs. ? Dairy: Low-fat milk. Cheese. Greek yogurt. ? Beverages: Water. Red wine. Herbal tea. ? Fats and oils: Extra virgin olive oil. Avocado oil. Grape seed oil. ? Sweets and desserts: [place] yogurt with honey. Baked apples. Poached pears. Trail mix. ? Seasoning and other foods: Basil. Cilantro. Coriander. Cumin. Mint. Parsley. Sage. Rosemary. Tarragon. Garlic. Oregano. Thyme. Pepper. Balsalmic vinegar. Tahini. Hummus. Tomato sauce. Olives. Mushrooms. ? Limit these ? Grains: Prepackaged pasta or rice dishes. Prepackaged cereal with added sugar. ? Vegetables: Deep fried potatoes (french fries). ? Fruits: Fruit canned in syrup. ? Meats and other protein foods: Beef. Pork. Lamb. Poultry with skin. Hot dogs. Tomasa BlaseBacon. ? Dairy: Ice cream. Sour cream. Whole milk. ? Beverages: Juice. Sugar-sweetened soft drinks. Beer. Liquor and spirits. ? Fats and oils: Butter. Canola oil. Vegetable oil. Beef fat (tallow). Lard. ? Sweets and desserts: Cookies. Cakes. Pies. Candy. ? Seasoning and other foods: Mayonnaise. Premade sauces and marinades. ? The items listed may not be a complete list. Talk with your dietitian about what dietary choices are right for you. Summary  The Mediterranean diet includes both food and lifestyle choices.  Eat a variety of fresh fruits and vegetables, beans, nuts, seeds, and whole  grains.  Limit the amount of red meat and sweets that you eat.  Talk with your health care provider about whether it is safe for you to drink red wine in moderation. This means 1 glass a day for nonpregnant women and 2 glasses a day for men. A glass of wine equals 5 oz (150 mL). This information is not intended to replace advice given to you by your health care provider. Make sure you discuss any questions you have with your health care provider. Document Released: 02/15/2016 Document Revised: 03/19/2016 Document Reviewed: 02/15/2016 Elsevier Interactive Patient Education  [employer]2018 Elsevier Inc.

## 2017-07-31 NOTE — Progress Notes (Signed)
Name: Jacob [name]   MRN: [id number]    DOB: 04-Aug-1985   Date:07/31/2017       Progress Note  Subjective  Chief Complaint  Chief Complaint  Patient presents with  . Follow-up    BP med made him feel lightheaded     HPI Jacob [name] presents today for a follow up of blood pressure medication initiation  Hypertension -he was started on amlodipine 5 daily at his last appointment on 1/4 due to multiple elevated readings. Reports he tried the amlodipine but it made him feel "foggy" in his head so he stopped taking after about 2 days. Denies headaches, vision changes, chest pain, shortness of breath, edema.  BP Readings from Last 3 Encounters:  07/31/17 (!) 142/82  07/31/17 (!) 142/90  07/11/17 (!) 144/84     Patient Active Problem List   Diagnosis Date Noted  . Nonallopathic lesion of thoracic region 07/31/2017  . Nonallopathic lesion of sacral region 07/31/2017  . Nonallopathic lesion of lumbosacral region 07/31/2017  . Hypertension 07/11/2017  . Chronic bilateral low back pain without sciatica 06/06/2017  . Intermittent explosive disorder in adult   . Bipolar II disorder (HCC) 08/07/2016  . Bipolar 2 disorder (HCC) 08/07/2016  . Mood disorder (HCC) 04/30/2016  . Moderate episode of recurrent major depressive disorder (HCC) 04/29/2016  . Asthma with acute exacerbation 04/09/2016  . Allergic rhinitis 04/09/2016  . Routine general medical examination at a health care facility 10/26/2014  . Bipolar 1 disorder, mixed, moderate (HCC) 05/05/2014  . ADHD (attention deficit hyperactivity disorder) 05/05/2014    No past surgical history on file.  Family History  Problem Relation Age of Onset  . Cancer Mother     Social History   Socioeconomic History  . Marital status: Married    Spouse name: Not on file  . Number of children: 0  . Years of education: 42  . Highest education level: Not on file  Social Needs  . Financial resource strain: Not on file  . Food insecurity -  worry: Not on file  . Food insecurity - inability: Not on file  . Transportation needs - medical: Not on file  . Transportation needs - non-medical: Not on file  Occupational History  . Occupation: Asst Event organiser: [employer]  Tobacco Use  . Smoking status: Former Smoker    Packs/day: 0.00    Years: 9.00    Pack years: 0.00    Last attempt to quit: 12/2010    Years since quitting: 6.6  . Smokeless tobacco: Never Used  Substance and Sexual Activity  . Alcohol use: No    Comment: Rare  . Drug use: No  . Sexual activity: Yes    Partners: Female    Birth control/protection: Condom, Diaphragm  Other Topics Concern  . Not on file  Social History Narrative   Born and raised in [place]. Currently live in private residence with wife Jacob [name]). 3 cats, 2 dogs.   Fun: Engineer, petroleum   Denies religious beliefs that would effect healthcare.      Current Outpatient Medications:  .  albuterol (PROVENTIL HFA;VENTOLIN HFA) 108 (90 Base) MCG/ACT inhaler, Inhale 2 puffs into the lungs every 6 (six) hours as needed for wheezing or shortness of breath., Disp: 1 Inhaler, Rfl: 2 .  amLODipine (NORVASC) 5 MG tablet, Take 1 tablet (5 mg total) by mouth daily., Disp: 30 tablet, Rfl: 0 .  budesonide-formoterol (SYMBICORT) 160-4.5 MCG/ACT inhaler, Inhale 2 puffs into  the lungs 2 (two) times daily. Must keep appt w/new provider for future refills, Disp: 3 Inhaler, Rfl: 3 .  chlorproMAZINE (THORAZINE) 50 MG tablet, Take 1 tablet (50 mg total) by mouth daily as needed (for agitation)., Disp: 90 tablet, Rfl: 1 .  Diclofenac Sodium (PENNSAID) 2 % SOLN, Place 2 g onto the skin 2 (two) times daily., Disp: 112 g, Rfl: 3 .  divalproex (DEPAKOTE ER) 500 MG 24 hr tablet, Take 3 tablets (1,500 mg total) by mouth daily., Disp: 270 tablet, Rfl: 3 .  GuanFACINE HCl 3 MG TB24, Take 1 tablet (3 mg total) by mouth 2 (two) times daily. 6 mg total daily, Disp: 180 tablet, Rfl: 1 .  lisdexamfetamine  (VYVANSE) 70 MG capsule, Take 1 capsule (70 mg total) by mouth daily., Disp: 90 capsule, Rfl: 0 .  lithium carbonate (ESKALITH) 450 MG CR tablet, Take 1 tablet (450 mg total) by mouth 2 (two) times daily., Disp: 180 tablet, Rfl: 1 .  tizanidine (ZANAFLEX) 2 MG capsule, Take 1 capsule (2 mg total) by mouth 3 (three) times daily., Disp: 90 capsule, Rfl: 0 .  Vitamin D, Ergocalciferol, (DRISDOL) 50000 units CAPS capsule, Take 1 capsule (50,000 Units total) by mouth every 7 (seven) days., Disp: 12 capsule, Rfl: 0  No Known Allergies   ROS See HPI  Objective  Vitals:   07/31/17 1043  BP: (!) 142/82  Pulse: 81  Resp: 16  SpO2: 98%  Weight: 216 lb (98 kg)  Height: 5\' 7"  (1.702 m)    Body mass index is 33.83 kg/m.  Physical Exam Constitutional: Patient appears well-developed and well-nourished. No distress.  HENT: Head: Normocephalic and atraumatic. Nose: Nose normal. Mouth/Throat: Oropharynx is clear and moist. No oropharyngeal exudate.  Eyes: Conjunctivae are normal. No scleral icterus.  Neck: Normal range of motion. Neck supple. Cardiovascular: Normal rate, regular rhythm and normal heart sounds. No BLE edema. Distal pulses intact. Pulmonary/Chest: Effort normal. Mild wheezes to lower lung fields. Musculoskeletal: Normal range of motion, no joint effusions. No gross deformities Neurological: He is alert and oriented to person, place, and time. Coordination, balance, strength, speech and gait are normal.  Skin: Skin is warm and dry. No rash noted. No erythema.  Psychiatric: Patient has a normal mood and affect. Judgment and thought content normal.   Assessment & Plan RTC in about 2 weeks for follow up of losartan, repeat BMET, reconsider referral to pulm for asthma if wheezing persists

## 2017-07-31 NOTE — Assessment & Plan Note (Signed)
Decision today to treat with OMT was based on Physical Exam  After verbal consent patient was treated with HVLA, ME, FPR techniques in cervical, thoracic, lumbar and sacral areas  Patient tolerated the procedure well with improvement in symptoms  Patient given exercises, stretches and lifestyle modifications  See medications in patient instructions if given  Patient will follow up in 6 weeks 

## 2017-08-03 ENCOUNTER — Encounter: Payer: Self-pay | Admitting: Nurse Practitioner

## 2017-08-03 NOTE — Assessment & Plan Note (Signed)
Diet discuseed-See AVS for education provided to patient He stopped amlodipine on his own- will start losartan today - losartan (COZAAR) 50 MG tablet; Take 1 tablet (50 mg total) by mouth daily.  Dispense: 90 tablet; Refill: 3 - Basic metabolic panel; Future

## 2017-08-03 NOTE — Assessment & Plan Note (Signed)
Wheezing auscultated on PE today. O2 sats are normal, no respiratory distress. He reports using his symbicort daily and albuterol prn- he says he does not feel SOB and has not noticed wheezing. He says his cough resolved from last visit on 1/4 (he was given steroid injection in clinic that day). I encouraged again a chest xray and referral to pulm today, as we discussed on his last visit, but he declines again today. He says he will let me know at next visit if he would like CXR/pulm referral We could also add singulair at his next visit- elevated EOS on CBC in December)

## 2017-08-06 ENCOUNTER — Other Ambulatory Visit: Payer: Self-pay | Admitting: Nurse Practitioner

## 2017-08-06 DIAGNOSIS — I10 Essential (primary) hypertension: Secondary | ICD-10-CM

## 2017-08-07 MED ORDER — ALBUTEROL SULFATE HFA 108 (90 BASE) MCG/ACT IN AERS: 2.0000 | INHALATION_SPRAY | Freq: Four times a day (QID) | RESPIRATORY_TRACT | 2 refills | Status: AC | PRN

## 2017-08-07 NOTE — Addendum Note (Signed)
Addended by: [name], Janaiyah Blackard N on: 08/07/2017 12:44 PM   Modules accepted: Orders

## 2017-08-13 ENCOUNTER — Other Ambulatory Visit: Payer: Self-pay | Admitting: Nurse Practitioner

## 2017-08-13 DIAGNOSIS — M545 Low back pain: Principal | ICD-10-CM

## 2017-08-13 DIAGNOSIS — G8929 Other chronic pain: Secondary | ICD-10-CM

## 2017-08-27 ENCOUNTER — Ambulatory Visit: Payer: Self-pay | Admitting: Nurse Practitioner

## 2017-09-03 NOTE — Progress Notes (Signed)
Tawana ScaleZach [name] D.O. [facility] [address] [place], [place] [address] Phone: [phone] Subjective:      CC: Back pain follow-up, shoulder pain    BJY:NWGNFAOZHYHPI:Subjective  Jacob [name] is a 32 y.o. male coming in with complaint of lower back pain. He has been doing much better. He has had 3 spasms in his back since last visit.   Patient states that his right shoulder is also bothering him. He said that he sleeps on the right side and feels that he is losing range of motion. He states that over the past 3 months that his shoulder seems to be getting worse. He lifts a lot of heavy items at work.        Past Medical History:  Diagnosis Date  . ADHD (attention deficit hyperactivity disorder) evaluation   . Allergy   . Bipolar disorder (HCC)    No past surgical history on file. Social History   Socioeconomic History  . Marital status: Married    Spouse name: Not on file  . Number of children: 0  . Years of education: 1312  . Highest education level: Not on file  Social Needs  . Financial resource strain: Not on file  . Food insecurity - worry: Not on file  . Food insecurity - inability: Not on file  . Transportation needs - medical: Not on file  . Transportation needs - non-medical: Not on file  Occupational History  . Occupation: Asst Event organiserManager    Employer: [employer]  Tobacco Use  . Smoking status: Former Smoker    Packs/day: 0.00    Years: 9.00    Pack years: 0.00    Last attempt to quit: 12/2010    Years since quitting: 6.7  . Smokeless tobacco: Never Used  Substance and Sexual Activity  . Alcohol use: No    Comment: Rare  . Drug use: No  . Sexual activity: Yes    Partners: Female    Birth control/protection: Condom, Diaphragm  Other Topics Concern  . Not on file  Social History Narrative   [place] Currently live in private residence with wife Jacob [name]([name]). 3 cats, 2 dogs.   Fun: Engineer, petroleumlectronics and stuff   Denies religious  beliefs that would effect healthcare.    Allergies  Allergen Reactions  . Amlodipine Other (See Comments)    Head felt foggy   Family History  Problem Relation Age of Onset  . Cancer Mother      Past medical history, social, surgical and family history all reviewed in electronic medical record.  No pertanent information unless stated regarding to the chief complaint.   Review of Systems:Review of systems updated and as accurate as of 09/03/17  No headache, visual changes, nausea, vomiting, diarrhea, constipation, dizziness, abdominal pain, skin rash, fevers, chills, night sweats, weight loss, swollen lymph nodes, body aches, joint swelling, muscle aches, chest pain, shortness of breath, mood changes.   Objective  There were no vitals taken for this visit. Systems examined below as of 09/03/17   General: No apparent distress alert and oriented x3 mood and affect normal, dressed appropriately.  HEENT: Pupils equal, extraocular movements intact  Respiratory: Patient's speak in full sentences and does not appear short of breath  Cardiovascular: No lower extremity edema, non tender, no erythema  Skin: Warm dry intact with no signs of infection or rash on extremities or on axial skeleton.  Abdomen: Soft nontender  Neuro: Cranial nerves II through XII are  intact, neurovascularly intact in all extremities with 2+ DTRs and 2+ pulses.  Lymph: No lymphadenopathy of posterior or anterior cervical chain or axillae bilaterally.  Gait normal with good balance and coordination.  MSK:  Non tender with full range of motion and good stability and symmetric strength and tone of elbows, wrist, hip, knee and ankles bilaterally.  Shoulder: Right Inspection reveals no abnormalities, atrophy or asymmetry. Palpation is normal with no tenderness over AC joint or bicipital groove. ROM is full in all planes passively. Rotator cuff strength normal throughout. signs of impingement with positive Neer and  Hawkin's tests, but negative empty can sign. Speeds and Yergason's tests normal. No labral pathology noted with negative Obrien's, negative clunk and good stability. Normal scapular function observed. No painful arc and no drop arm sign. No apprehension sign Contralateral shoulder unremarkable  Patient Some mild tightness.  Lower back sacroiliac pain.  Positive Pearlean Brownie.  Overall.  Strength is 5 out of 5 in the lower extremities.  MSK US performed of: Right This study was ordered, performed, and interpreted by Terrilee Files D.O.  Shoulder:   Supraspinatus:  Appears normal on long and transverse views, Bursal bulge seen with shoulder abduction on impingement view. Infraspinatus:  Appears normal on long and transverse views. Significant increase in Doppler flow Subscapularis:  Appears normal on long and transverse views. Positive bursa Teres Minor:  Appears normal on long and transverse views. AC joint:  Capsule undistended, no geyser sign. Glenohumeral Joint:  Appears normal without effusion. Glenoid Labrum:  Intact without visualized tears. Biceps Tendon:  Appears normal on long and transverse views, no fraying of tendon, tendon located in intertubercular groove, no subluxation with shoulder internal or external rotation.  Impression: Subacromial bursitis  Osteopathic findings C2 flexed rotated and side bent right C7 flexed rotated and side bent left T3 extended rotated and side bent right inhaled third rib T6 extended rotated and side bent left L3 flexed rotated and side bent right Sacrum right on right     Impression and Recommendations:     This case required medical decision making of moderate complexity.      Note: This dictation was prepared with Dragon dictation along with smaller phrase technology. Any transcriptional errors that result from this process are unintentional.

## 2017-09-04 ENCOUNTER — Ambulatory Visit: Payer: BLUE CROSS/BLUE SHIELD | Admitting: Family Medicine

## 2017-09-04 ENCOUNTER — Encounter: Payer: Self-pay | Admitting: Family Medicine

## 2017-09-04 ENCOUNTER — Ambulatory Visit: Payer: Self-pay

## 2017-09-04 VITALS — BP 132/82 | HR 101 | Ht 67.0 in | Wt 216.0 lb

## 2017-09-04 DIAGNOSIS — M25511 Pain in right shoulder: Principal | ICD-10-CM

## 2017-09-04 DIAGNOSIS — G8929 Other chronic pain: Secondary | ICD-10-CM | POA: Diagnosis not present

## 2017-09-04 DIAGNOSIS — M999 Biomechanical lesion, unspecified: Secondary | ICD-10-CM | POA: Diagnosis not present

## 2017-09-04 DIAGNOSIS — M545 Low back pain, unspecified: Secondary | ICD-10-CM

## 2017-09-04 DIAGNOSIS — M7551 Bursitis of right shoulder: Secondary | ICD-10-CM

## 2017-09-04 NOTE — Assessment & Plan Note (Signed)
Stable overall.  Responding well to manipulation.  Likely could go up to 8-12-week intervals.  We will see patient back again in 4 weeks secondary to patient's shoulder pain.

## 2017-09-04 NOTE — Patient Instructions (Signed)
Good to see you  Overall neck is great  For the shoulder Ice 20 minutes 2 times daily. Usually after activity and before bed. Exercises 3 times a week.  pennsaid pinkie amount topically 2 times daily as needed.  Keep hands within peripheral vision as much as possible See me again in 5-6 weeks

## 2017-09-04 NOTE — Assessment & Plan Note (Signed)
Findings.  Home exercises given, discussed icing regimen and topical anti-inflammatories.  Discussed ergonomics.  Follow-up if worsening symptoms consider injection

## 2017-09-04 NOTE — Assessment & Plan Note (Signed)
Decision today to treat with OMT was based on Physical Exam  After verbal consent patient was treated with HVLA, ME, FPR techniques in cervical, thoracic, lumbar and sacral areas  Patient tolerated the procedure well with improvement in symptoms  Patient given exercises, stretches and lifestyle modifications  See medications in patient instructions if given  Patient will follow up in 4 weeks 

## 2017-09-15 ENCOUNTER — Other Ambulatory Visit (HOSPITAL_COMMUNITY): Payer: Self-pay

## 2017-09-15 ENCOUNTER — Encounter (HOSPITAL_COMMUNITY): Payer: Self-pay | Admitting: Psychiatry

## 2017-09-15 ENCOUNTER — Ambulatory Visit (HOSPITAL_COMMUNITY): Payer: BLUE CROSS/BLUE SHIELD | Admitting: Psychiatry

## 2017-09-15 DIAGNOSIS — R4689 Other symptoms and signs involving appearance and behavior: Secondary | ICD-10-CM | POA: Diagnosis not present

## 2017-09-15 DIAGNOSIS — Z79899 Other long term (current) drug therapy: Secondary | ICD-10-CM

## 2017-09-15 DIAGNOSIS — F3162 Bipolar disorder, current episode mixed, moderate: Secondary | ICD-10-CM

## 2017-09-15 DIAGNOSIS — Z630 Problems in relationship with spouse or partner: Secondary | ICD-10-CM | POA: Diagnosis not present

## 2017-09-15 DIAGNOSIS — F902 Attention-deficit hyperactivity disorder, combined type: Secondary | ICD-10-CM | POA: Diagnosis not present

## 2017-09-15 DIAGNOSIS — Z87891 Personal history of nicotine dependence: Secondary | ICD-10-CM | POA: Diagnosis not present

## 2017-09-15 DIAGNOSIS — F6381 Intermittent explosive disorder: Secondary | ICD-10-CM | POA: Diagnosis not present

## 2017-09-15 MED ORDER — LISDEXAMFETAMINE DIMESYLATE 70 MG PO CAPS: 70.0000 mg | ORAL_CAPSULE | Freq: Every day | ORAL | 0 refills | Status: DC

## 2017-09-15 NOTE — Progress Notes (Signed)
BH MD/PA/NP OP Progress Note  09/15/2017 11:26 AM Jacob [name]  MRN:  [id number]  Chief Complaint: med management  HPI: MILT COYE reports that things are going okay and his work life, personal life with his friends, he is driving better less angry, but reports that he and his wife continue to have conflict.  I spent time again with the patient reiterating the value of individual therapy and couples therapy, and that I expect they will continue to hit a wall in terms of his progress if they do not participate in couples therapy.  He acknowledges this and continues to struggle with interest and motivation to schedule couples therapy.  He continues on lithium 450 mg twice a day and we agreed to get a follow-up level today given that he was recently started on blood pressure medicine.  We agreed to taper and discontinue Depakote given that lithium adequately covers aggressive acting out and mood stability.  He continues on Intuniv twice a day and Vyvanse.  He denies any intentions to harm himself or others.  He reports that he feels like things are more calm with him, but he continues to have conflicts with his wife.  They tend to argue over admittedly minute things, that later on they both recognize are not worth the arguments.  Visit Diagnosis:    ICD-10-CM   1. Attention deficit hyperactivity disorder (ADHD), combined type F90.2 lisdexamfetamine (VYVANSE) 70 MG capsule  2. Bipolar 1 disorder, mixed, moderate (HCC) F31.62   3. Intermittent explosive disorder in adult F63.81   4. Aggression R46.89     Past Psychiatric History: See intake H&P for full details. Reviewed, with no updates at this time.   Past Medical History:  Past Medical History:  Diagnosis Date  . ADHD (attention deficit hyperactivity disorder) evaluation   . Allergy   . Bipolar disorder (HCC)    No past surgical history on file.  Family Psychiatric History: See intake H&P for full details. Reviewed, with no updates  at this time.   Family History:  Family History  Problem Relation Age of Onset  . Cancer Mother     Social History:  Social History   Socioeconomic History  . Marital status: Married    Spouse name: None  . Number of children: 0  . Years of education: 69  . Highest education level: None  Social Needs  . Financial resource strain: None  . Food insecurity - worry: None  . Food insecurity - inability: None  . Transportation needs - medical: None  . Transportation needs - non-medical: None  Occupational History  . Occupation: Asst Event organiser: [employer]  Tobacco Use  . Smoking status: Former Smoker    Packs/day: 0.00    Years: 9.00    Pack years: 0.00    Last attempt to quit: 12/2010    Years since quitting: 6.7  . Smokeless tobacco: Never Used  Substance and Sexual Activity  . Alcohol use: No    Comment: Rare  . Drug use: No  . Sexual activity: Yes    Partners: Female    Birth control/protection: Condom, Diaphragm  Other Topics Concern  . None  Social History Narrative   Born and raised in [place]. Currently live in private residence with wife [name]). 3 cats, 2 dogs.   Fun: Engineer, petroleum   Denies religious beliefs that would effect healthcare.     Allergies:  Allergies  Allergen Reactions  . Amlodipine Other (  See Comments)    Head felt foggy    Metabolic Disorder Labs: Lab Results  Component Value Date   HGBA1C 5.4 08/09/2016   MPG 108 08/09/2016   Lab Results  Component Value Date   PROLACTIN 35.9 (H) 08/09/2016   Lab Results  Component Value Date   CHOL 128 08/09/2016   TRIG 97 08/09/2016   HDL 29 (L) 08/09/2016   CHOLHDL 4.4 08/09/2016   VLDL 19 08/09/2016   LDLCALC 80 08/09/2016   Lab Results  Component Value Date   TSH 2.200 06/23/2017   TSH 0.668 08/09/2016    Therapeutic Level Labs: Lab Results  Component Value Date   LITHIUM 0.9 06/27/2017   LITHIUM 0.5 (L) 06/20/2017   Lab Results  Component  Value Date   VALPROATE 80 06/20/2017   VALPROATE 69 03/05/2017   No components found for:  CBMZ  Current Medications: Current Outpatient Medications  Medication Sig Dispense Refill  . albuterol (PROVENTIL HFA;VENTOLIN HFA) 108 (90 Base) MCG/ACT inhaler Inhale 2 puffs into the lungs every 6 (six) hours as needed for wheezing or shortness of breath. 1 Inhaler 2  . budesonide-formoterol (SYMBICORT) 160-4.5 MCG/ACT inhaler Inhale 2 puffs into the lungs 2 (two) times daily. Must keep appt w/new provider for future refills 3 Inhaler 3  . chlorproMAZINE (THORAZINE) 50 MG tablet Take 1 tablet (50 mg total) by mouth daily as needed (for agitation). 90 tablet 1  . Diclofenac Sodium (PENNSAID) 2 % SOLN Place 2 g onto the skin 2 (two) times daily. 112 g 3  . divalproex (DEPAKOTE ER) 500 MG 24 hr tablet Take 3 tablets (1,500 mg total) by mouth daily. 270 tablet 3  . GuanFACINE HCl 3 MG TB24 Take 1 tablet (3 mg total) by mouth 2 (two) times daily. 6 mg total daily 180 tablet 1  . lisdexamfetamine (VYVANSE) 70 MG capsule Take 1 capsule (70 mg total) by mouth daily. 90 capsule 0  . lithium carbonate (ESKALITH) 450 MG CR tablet Take 1 tablet (450 mg total) by mouth 2 (two) times daily. 180 tablet 1  . losartan (COZAAR) 50 MG tablet Take 1 tablet (50 mg total) by mouth daily. 90 tablet 3  . tizanidine (ZANAFLEX) 2 MG capsule TAKE 1 CAPSULE BY MOUTH THREE TIMES A DAY 90 capsule 0  . Vitamin D, Ergocalciferol, (DRISDOL) 50000 units CAPS capsule Take 1 capsule (50,000 Units total) by mouth every 7 (seven) days. 12 capsule 0   No current facility-administered medications for this visit.      Musculoskeletal: Strength & Muscle Tone: within normal limits Gait & Station: normal Patient leans: N/A  Psychiatric Specialty Exam: ROS  Blood pressure 118/80, pulse 70, height 5\' 7"  (1.702 m), weight 219 lb (99.3 kg), SpO2 99 %.Body mass index is 34.3 kg/m.  General Appearance: Casual and Well Groomed  Eye  Contact:  Good  Speech:  Clear and Coherent and Normal Rate  Volume:  Normal  Mood:  Euthymic  Affect:  Appropriate and Congruent  Thought Process:  Coherent, Goal Directed and Descriptions of Associations: Intact  Orientation:  Full (Time, Place, and Person)  Thought Content: Logical   Suicidal Thoughts:  No  Homicidal Thoughts:  No  Memory:  Immediate;   Good  Judgement:  Good  Insight:  Fair  Psychomotor Activity:  Normal  Concentration:  Concentration: Good  Recall:  Good  Fund of Knowledge: Good  Language: Good  Akathisia:  Negative  Handed:  Right  AIMS (if indicated): not done  Assets:  Communication Skills Desire for Improvement Financial Resources/Insurance Housing Intimacy Transportation Vocational/Educational  ADL's:  Intact  Cognition: WNL  Sleep:  Good   Screenings: AUDIT     [facility]  Alcohol Use Disorder Identification Test Final Score (AUDIT)  0    PHQ2-9     Office Visit from 06/06/2017 in [facility] Office Visit from 02/02/2016 in [facility] at [facility]  PHQ-2 Total Score  0  0  PHQ-9 Total Score  0  No data       Assessment and Plan: Jacob [name] presents for med management follow-up.  His mood and acting out symptoms are generally stable on the current medication regimen.  He does complain of weight gain, fatigue, and I suspect Depakote is responsible for this.  We agreed to taper Depakote given that lithium has generally been robust and effective for him in the past.  He continues on Vyvanse and Intuniv for ADHD.  He denies any acute safety issues.  He and his wife continue to engage in verbal aggression and frequent arguments, and of yet to participate in couples therapy which has been recommended, referrals have been made multiple times.  1. Attention deficit hyperactivity disorder (ADHD), combined type   2. Bipolar 1 disorder, mixed,  moderate (HCC)   3. Intermittent explosive disorder in adult   4. Aggression     Status of current problems: stable  Labs Ordered: No orders of the defined types were placed in this encounter.   Labs Reviewed:  Lab Results  Component Value Date   LITHIUM 0.9 06/27/2017   NA 138 07/31/2017   BUN 14 07/31/2017   CREATININE 1.08 07/31/2017   TSH 2.200 06/23/2017   WBC 8.2 06/20/2017    Collateral Obtained/Records Reviewed: n/a  Plan:  CMP, lithium, TSH, free T4 Continue lithium 450 mg twice a day Taper Depakote to discontinue Continue Vyvanse and Intuniv as prescribed Return to clinic in 8 weeks  I spent 25 minutes with the patient in direct face-to-face clinical care.  Greater than 50% of this time was spent in counseling and coordination of care with the patient.    Burnard LeighAlexander Arya Kateryna Grantham, MD 09/15/2017, 11:26 AM

## 2017-09-15 NOTE — Patient Instructions (Addendum)
Decrease depakote to 2 capsules nightly for 2 weeks, then 1 capsule nightly for 2 weeks, then stop completely  Lets re-check lithium level and thyroid levels

## 2017-09-16 ENCOUNTER — Other Ambulatory Visit: Payer: Self-pay | Admitting: Family Medicine

## 2017-09-16 LAB — LITHIUM LEVEL: LITHIUM LVL: 1.1 mmol/L (ref 0.6–1.2)

## 2017-09-16 LAB — COMPREHENSIVE METABOLIC PANEL
ALK PHOS: 40 IU/L (ref 39–117)
ALT: 14 IU/L (ref 0–44)
AST: 19 IU/L (ref 0–40)
Albumin/Globulin Ratio: 2 (ref 1.2–2.2)
Albumin: 4.5 g/dL (ref 3.5–5.5)
BUN/Creatinine Ratio: 11 (ref 9–20)
BUN: 12 mg/dL (ref 6–20)
Bilirubin Total: <0.2 mg/dL (ref 0.0–1.2)
CALCIUM: 9.7 mg/dL (ref 8.7–10.2)
CO2: 21 mmol/L (ref 20–29)
CREATININE: 1.1 mg/dL (ref 0.76–1.27)
Chloride: 105 mmol/L (ref 96–106)
GFR calc Af Amer: 104 mL/min/{1.73_m2} (ref >59)
GFR, EST NON AFRICAN AMERICAN: 90 mL/min/{1.73_m2} (ref >59)
GLUCOSE: 97 mg/dL (ref 65–99)
Globulin, Total: 2.3 g/dL (ref 1.5–4.5)
Potassium: 5.6 mmol/L — ABNORMAL HIGH (ref 3.5–5.2)
Sodium: 142 mmol/L (ref 134–144)
Total Protein: 6.8 g/dL (ref 6.0–8.5)

## 2017-09-16 LAB — T4, FREE: FREE T4: 1.07 ng/dL (ref 0.82–1.77)

## 2017-09-16 LAB — TSH: TSH: 3.29 u[IU]/mL (ref 0.450–4.500)

## 2017-09-16 NOTE — Progress Notes (Signed)
Hey there, I see that yall started cozaar. Just a heads up to check lithium level and cmp anytime you're starting an antihypertensive that acts on the kidney tubules, if patients are on lithium.  Lithium level is a little bit on the higher end of normal now, and will likely normalize after cozaar is stopped.  Potassium should also normalize.     May want to consider switching him from cozaar to a beta blocker such as metoprolol for his blood pressure since lithium will likely be a long term therapy for him.

## 2017-09-17 ENCOUNTER — Other Ambulatory Visit: Payer: Self-pay | Admitting: Nurse Practitioner

## 2017-09-17 ENCOUNTER — Telehealth: Payer: Self-pay | Admitting: Nurse Practitioner

## 2017-09-17 DIAGNOSIS — I10 Essential (primary) hypertension: Secondary | ICD-10-CM

## 2017-09-17 MED ORDER — METOPROLOL SUCCINATE ER 25 MG PO TB24: 25.0000 mg | ORAL_TABLET | Freq: Every day | ORAL | 1 refills | Status: DC

## 2017-09-17 NOTE — Telephone Encounter (Signed)
His psychiatrist recommended switching his BP medication because losartan can interact with his lithium. I have sent a new prescription for metoprolol succinate 25 mg daily. Please have him stop losartan and start metoprolol Please schedule him for a 2 week follow up so that we can see how his blood pressure is doing on the new medication and recheck his lab work. Also let me know if he has any BP readings over 140/90 at home

## 2017-09-17 NOTE — Progress Notes (Signed)
His psychiatrist recommended switching his blood pressure medications due to possible interaction between his lithium and losartan, I have discontinued his losartan and sent a new prescription for metoprolol succinate 25 once daily. Please have stop the losartan and start the metoprolol and monitor blood pressure at home. Please schedule him for a follow up for an office visit in 2 weeks to recheck his lab work and blood pressure. He should let us know if he gets BP readings over 140/90 in the meantime.

## 2017-09-17 NOTE — Telephone Encounter (Signed)
-----   Message from Burnard LeighAlexander Arya Eksir, MD sent at 09/16/2017  9:09 AM EDT ----- Hey there, I see that yall started cozaar. Just a heads up to check lithium level and cmp anytime you're starting an antihypertensive that acts on the kidney tubules, if patients are on lithium.  Lithium level is a little bit on the higher end of nor mal now, and will likely normalize after cozaar is stopped.  Potassium should also normalize.     May want to consider switching him from cozaar to a beta blocker such as metoprolol for his blood pressure since lithium will likely be a long term therapy for him.

## 2017-09-18 NOTE — Progress Notes (Signed)
Thank you Morrie Sheldonshley, I think that's a good plan. Have a nice weekend! Alex

## 2017-09-18 NOTE — Telephone Encounter (Signed)
LVM letting pt know results. 

## 2017-09-18 NOTE — Telephone Encounter (Signed)
LVM for pt to let him know message below. Asked him to call back with any questions or concerns if needed.

## 2017-10-02 ENCOUNTER — Encounter: Payer: Self-pay | Admitting: Nurse Practitioner

## 2017-10-02 ENCOUNTER — Ambulatory Visit: Payer: BLUE CROSS/BLUE SHIELD | Admitting: Nurse Practitioner

## 2017-10-02 ENCOUNTER — Other Ambulatory Visit (INDEPENDENT_AMBULATORY_CARE_PROVIDER_SITE_OTHER): Payer: BLUE CROSS/BLUE SHIELD

## 2017-10-02 VITALS — BP 122/80 | HR 63 | Temp 98.6°F | Resp 16 | Ht 67.0 in | Wt 207.0 lb

## 2017-10-02 DIAGNOSIS — R05 Cough: Secondary | ICD-10-CM

## 2017-10-02 DIAGNOSIS — I10 Essential (primary) hypertension: Secondary | ICD-10-CM | POA: Diagnosis not present

## 2017-10-02 DIAGNOSIS — R61 Generalized hyperhidrosis: Secondary | ICD-10-CM

## 2017-10-02 DIAGNOSIS — R059 Cough, unspecified: Secondary | ICD-10-CM

## 2017-10-02 DIAGNOSIS — R251 Tremor, unspecified: Secondary | ICD-10-CM | POA: Diagnosis not present

## 2017-10-02 LAB — COMPREHENSIVE METABOLIC PANEL
ALBUMIN: 4.7 g/dL (ref 3.5–5.2)
ALK PHOS: 42 U/L (ref 39–117)
ALT: 17 U/L (ref 0–53)
AST: 17 U/L (ref 0–37)
BILIRUBIN TOTAL: 0.5 mg/dL (ref 0.2–1.2)
BUN: 11 mg/dL (ref 6–23)
CO2: 27 mEq/L (ref 19–32)
Calcium: 10.1 mg/dL (ref 8.4–10.5)
Chloride: 106 mEq/L (ref 96–112)
Creatinine, Ser: 1.12 mg/dL (ref 0.40–1.50)
GFR: 81.44 mL/min (ref >60.00)
Glucose, Bld: 110 mg/dL — ABNORMAL HIGH (ref 70–99)
POTASSIUM: 4.3 meq/L (ref 3.5–5.1)
Sodium: 138 mEq/L (ref 135–145)
TOTAL PROTEIN: 7.3 g/dL (ref 6.0–8.3)

## 2017-10-02 LAB — HEMOGLOBIN A1C: HEMOGLOBIN A1C: 5.2 % (ref 4.6–6.5)

## 2017-10-02 NOTE — Progress Notes (Signed)
Name: Jacob [name]   MRN: [id number]    DOB: 1985/08/28   Date:10/02/2017       Progress Note  Subjective  Chief Complaint  Chief Complaint  Patient presents with  . Follow-up    blood pressure    HPI MR Jacob [name] Is here for BP follow up. We will also follow up on a cough and "feeling shaky."  Hypertension -started on metoprolol succinate 25 daily about 2 weeks ago- he was on losartan but was switched to metoprolol instead due to also being on lithium and having increased potassium levels Reports he did stop the losartan and start the metoprolol and has been taking daily as prescribed with no noted adverse medication effects. Denies headaches, vision changes, chest pain, shortness of breath, edema. He says he acutally feels better overall since taking the metoprolol  BP Readings from Last 3 Encounters:  10/02/17 122/80  09/04/17 132/82  07/31/17 (!) 142/82   Feeling shaky- This is not a new problem. He c/o feeling shaky and like his blood sugar drops after not eating for many hours, especially in the morning when he skips his breakfast He has no past history of diabetes but wonders if he might be diabetic He does report feeling very hungry and thirsty often but feels this is  A side effect of his psychiatric medications  Cough- This is not a new problem He has a history of asthma with cough and at his last office visit he actually told me the cough had improved after a steroid injection , but today reports dry cough has returned  He has continued to use his symbicort daily as prescribed, he uses albuterol rarely He describes his cough as dry cough and clear sputum He denies fevers, chest pain, shortness of breath, edema. We discussed referral to pulmonology at last OV for continued cough and abnormal past CT chest but he declined  Patient Active Problem List   Diagnosis Date Noted  . Acute bursitis of right shoulder 09/04/2017  . Nonallopathic lesion of thoracic region  07/31/2017  . Nonallopathic lesion of sacral region 07/31/2017  . Nonallopathic lesion of lumbosacral region 07/31/2017  . Hypertension 07/11/2017  . Chronic bilateral low back pain without sciatica 06/06/2017  . Intermittent explosive disorder in adult   . Bipolar II disorder (HCC) 08/07/2016  . Bipolar 2 disorder (HCC) 08/07/2016  . Mood disorder (HCC) 04/30/2016  . Moderate episode of recurrent major depressive disorder (HCC) 04/29/2016  . Asthma 04/09/2016  . Allergic rhinitis 04/09/2016  . Routine general medical examination at a health care facility 10/26/2014  . Bipolar 1 disorder, mixed, moderate (HCC) 05/05/2014  . ADHD (attention deficit hyperactivity disorder) 05/05/2014    No past surgical history on file.  Family History  Problem Relation Age of Onset  . Cancer Mother     Social History   Socioeconomic History  . Marital status: Married    Spouse name: Not on file  . Number of children: 0  . Years of education: 8312  . Highest education level: Not on file  Occupational History  . Occupation: Asst Event organiserManager    Employer: [name]  Social Needs  . Financial resource strain: Not on file  . Food insecurity:    Worry: Not on file    Inability: Not on file  . Transportation needs:    Medical: Not on file    Non-medical: Not on file  Tobacco Use  . Smoking status: Former Smoker    Packs/day: 0.00  Years: 9.00    Pack years: 0.00    Last attempt to quit: 12/2010    Years since quitting: 6.8  . Smokeless tobacco: Never Used  Substance and Sexual Activity  . Alcohol use: No    Comment: Rare  . Drug use: No  . Sexual activity: Yes    Partners: Female    Birth control/protection: Condom, Diaphragm  Lifestyle  . Physical activity:    Days per week: Not on file    Minutes per session: Not on file  . Stress: Not on file  Relationships  . Social connections:    Talks on phone: Not on file    Gets together: Not on file    Attends religious service:  Not on file    Active member of club or organization: Not on file    Attends meetings of clubs or organizations: Not on file    Relationship status: Not on file  . Intimate partner violence:    Fear of current or ex partner: Not on file    Emotionally abused: Not on file    Physically abused: Not on file    Forced sexual activity: Not on file  Other Topics Concern  . Not on file  Social History Narrative   Born and raised in [place]. Currently live in private residence with wife Jacob [name]). 3 cats, 2 dogs.   Fun: Engineer, petroleum   Denies religious beliefs that would effect healthcare.      Current Outpatient Medications:  .  albuterol (PROVENTIL HFA;VENTOLIN HFA) 108 (90 Base) MCG/ACT inhaler, Inhale 2 puffs into the lungs every 6 (six) hours as needed for wheezing or shortness of breath., Disp: 1 Inhaler, Rfl: 2 .  budesonide-formoterol (SYMBICORT) 160-4.5 MCG/ACT inhaler, Inhale 2 puffs into the lungs 2 (two) times daily. Must keep appt w/new provider for future refills, Disp: 3 Inhaler, Rfl: 3 .  chlorproMAZINE (THORAZINE) 50 MG tablet, Take 1 tablet (50 mg total) by mouth daily as needed (for agitation)., Disp: 90 tablet, Rfl: 1 .  Diclofenac Sodium (PENNSAID) 2 % SOLN, Place 2 g onto the skin 2 (two) times daily., Disp: 112 g, Rfl: 3 .  divalproex (DEPAKOTE ER) 500 MG 24 hr tablet, Take 3 tablets (1,500 mg total) by mouth daily., Disp: 270 tablet, Rfl: 3 .  GuanFACINE HCl 3 MG TB24, Take 1 tablet (3 mg total) by mouth 2 (two) times daily. 6 mg total daily, Disp: 180 tablet, Rfl: 1 .  lisdexamfetamine (VYVANSE) 70 MG capsule, Take 1 capsule (70 mg total) by mouth daily., Disp: 90 capsule, Rfl: 0 .  lithium carbonate (ESKALITH) 450 MG CR tablet, Take 1 tablet (450 mg total) by mouth 2 (two) times daily., Disp: 180 tablet, Rfl: 1 .  metoprolol succinate (TOPROL-XL) 25 MG 24 hr tablet, Take 1 tablet (25 mg total) by mouth daily., Disp: 30 tablet, Rfl: 1 .  tizanidine (ZANAFLEX)  2 MG capsule, TAKE 1 CAPSULE BY MOUTH THREE TIMES A DAY, Disp: 90 capsule, Rfl: 0 .  Vitamin D, Ergocalciferol, (DRISDOL) 50000 units CAPS capsule, TAKE ONE CAPSULE BY MOUTH EVERY 7 DAYS, Disp: 12 capsule, Rfl: 0  Allergies  Allergen Reactions  . Amlodipine Other (See Comments)    Head felt foggy     ROS See HPI  Objective  Vitals:   10/02/17 1121  BP: 122/80  Pulse: 63  Resp: 16  Temp: 98.6 F (37 C)  TempSrc: Oral  SpO2: 98%  Weight: 207 lb (93.9 kg)  Height:  5\' 7"  (1.702 m)    Body mass index is 32.42 kg/m.  Physical Exam Vital signs reviewed. Constitutional: Patient appears well-developed and well-nourished. No distress.  HENT: Head: Normocephalic and atraumatic. Nose: Nose normal. Mouth/Throat: Oropharynx is clear and moist. No oropharyngeal exudate.  Eyes: Conjunctivae are normal. Pupils equal, round and reactive to light. No scleral icterus.  Neck: Normal range of motion. Neck supple. Cardiovascular: Normal rate, regular rhythm and normal heart sounds. No BLE edema. Distal pulses intact. Pulmonary/Chest: Effort normal. Mild wheezes to lower lung fields. Musculoskeletal: Normal range of motion, no joint effusions. No gross deformities Neurological: He is alert and oriented to person, place, and time. Coordination, balance, strength, speech and gait are normal.  Skin: Skin is warm and dry. No rash noted. No erythema.  Psychiatric: Patient has a normal mood and affect. Judgment and thought content normal.  Assessment & Plan RTC in about 3 months for F/U: HTN, CPE   Cough He agrees to pulmonology referral today due to continued cough and wheezing heard on exam - Ambulatory referral to Pulmonology  Shakiness A1c ordered today We discussed importance of eating three healthy balanced meals a day to avoid symptoms of hypoglycemia, even in non-diabetic patient F/U if no improvement or worsening of symptoms  - Hemoglobin A1c; Future

## 2017-10-02 NOTE — Patient Instructions (Addendum)
Please head downstairs for lab work/x-rays. If any of your test results are critically abnormal, you will be contacted right away. Your results may be released to your MyChart for viewing before I am able to provide you with my response. I will contact you within a week about your test results and any recommendations for abnormalities.  I have placed a referral to pulmonology for further evaluation of your cough. Our office will call you to schedule this appointment. You should hear from our office in 7-10 days.  It was nice to see you. Thanks for letting me take care of you today :)

## 2017-10-07 ENCOUNTER — Encounter: Payer: Self-pay | Admitting: Nurse Practitioner

## 2017-10-07 NOTE — Assessment & Plan Note (Signed)
Stable, continue current medication RTC in 3 months for F/U - Comprehensive metabolic panel; Future

## 2017-10-09 ENCOUNTER — Ambulatory Visit: Payer: Self-pay | Admitting: Family Medicine

## 2017-10-10 ENCOUNTER — Other Ambulatory Visit: Payer: Self-pay | Admitting: Nurse Practitioner

## 2017-10-10 DIAGNOSIS — I10 Essential (primary) hypertension: Secondary | ICD-10-CM

## 2017-10-16 ENCOUNTER — Ambulatory Visit: Payer: BLUE CROSS/BLUE SHIELD | Admitting: Family Medicine

## 2017-10-16 VITALS — BP 118/86 | HR 84 | Ht 67.0 in | Wt 203.0 lb

## 2017-10-16 DIAGNOSIS — M7551 Bursitis of right shoulder: Secondary | ICD-10-CM

## 2017-10-16 DIAGNOSIS — M999 Biomechanical lesion, unspecified: Secondary | ICD-10-CM | POA: Diagnosis not present

## 2017-10-16 DIAGNOSIS — G8929 Other chronic pain: Secondary | ICD-10-CM

## 2017-10-16 DIAGNOSIS — M545 Low back pain: Secondary | ICD-10-CM

## 2017-10-16 NOTE — Assessment & Plan Note (Signed)
Decision today to treat with OMT was based on Physical Exam  After verbal consent patient was treated with HVLA, ME, FPR techniques in cervical, thoracic, lumbar and sacral areas  Patient tolerated the procedure well with improvement in symptoms  Patient given exercises, stretches and lifestyle modifications  See medications in patient instructions if given  Patient will follow up in 4-6 weeks 

## 2017-10-16 NOTE — Patient Instructions (Signed)
Good to see you  Ice is your friend Keep hands within peripheral vision.  Stay active.  Injected the right shoulder today  See me again in 6 weeks

## 2017-10-16 NOTE — Assessment & Plan Note (Signed)
Stable.  Discussed icing regimen and home exercises.  Discussed which activities to do which wants to avoid.  Patient will follow up with me again 4-6 weeks

## 2017-10-16 NOTE — Assessment & Plan Note (Signed)
Worsening symptoms.  Given injection.  Likely only some osteoarthritic changes is very likely secondary to patient's age.  Patient will likely have a bursitis that has been seen previously on ultrasound.  Responded well to the injection.  Declined formal physical therapy.  Follow-up again 4-6 weeks

## 2017-10-16 NOTE — Progress Notes (Signed)
[name] [address] [place], [place] [address] Phone: [phone] Subjective:      CC: Left shoulder back pain follow-up  [id number]  Jacob [name] is a 32 y.o. male coming in with complaint of Jacob [name] is a 32 y.o. male coming in with complaint of back and shoulder pain. Right shoulder pain has not improved. He has performed the exercises but has not had any relief. Pain has been getting worse and patient feels that he has to manually pop the shoulder to get relief of his pain.   His back is getting better. His back hurts with sitting down on a foam block at work in a deep squat position.  Has responded well to osteopathic manipulation.  Denies any numbness or tingling.  States that it feels like if he does the exercises improving overall.     Past Medical History:  Diagnosis Date  . ADHD (attention deficit hyperactivity disorder) evaluation   . Allergy   . Bipolar disorder (HCC)    No past surgical history on file. Social History   Socioeconomic History  . Marital status: Married    Spouse name: Not on file  . Number of children: 0  . Years of education: 73  . Highest education level: Not on file  Occupational History  . Occupation: Asst Event organiser: [employer]  Social Needs  . Financial resource strain: Not on file  . Food insecurity:    Worry: Not on file    Inability: Not on file  . Transportation needs:    Medical: Not on file    Non-medical: Not on file  Tobacco Use  . Smoking status: Former Smoker    Packs/day: 0.00    Years: 9.00    Pack years: 0.00    Last attempt to quit: 12/2010    Years since quitting: 6.8  . Smokeless tobacco: Never Used  Substance and Sexual Activity  . Alcohol use: No    Comment: Rare  . Drug use: No  . Sexual activity: Yes    Partners: Female    Birth control/protection: Condom, Diaphragm  Lifestyle  . Physical activity:    Days per week: Not on file    Minutes  per session: Not on file  . Stress: Not on file  Relationships  . Social connections:    Talks on phone: Not on file    Gets together: Not on file    Attends religious service: Not on file    Active member of club or organization: Not on file    Attends meetings of clubs or organizations: Not on file    Relationship status: Not on file  Other Topics Concern  . Not on file  Social History Narrative   Born and raised in [place]. Currently live in private residence with wife [name]). 3 cats, 2 dogs.   Fun: Engineer, petroleum   Denies religious beliefs that would effect healthcare.    Allergies  Allergen Reactions  . Amlodipine Other (See Comments)    Head felt foggy   Family History  Problem Relation Age of Onset  . Cancer Mother      Past medical history, social, surgical and family history all reviewed in electronic medical record.  No pertanent information unless stated regarding to the chief complaint.   Review of Systems:Review of systems updated and as accurate as of 10/16/17  No headache, visual changes, nausea, vomiting, diarrhea, constipation, dizziness, abdominal  pain, skin rash, fevers, chills, night sweats, weight loss, swollen lymph nodes, body aches, joint swelling, muscle aches, chest pain, shortness of breath, mood changes.   Objective  Blood pressure 118/86, pulse 84, height 5\' 7"  (1.702 m), weight 203 lb (92.1 kg), SpO2 98 %. Systems examined below as of 10/16/17   General: No apparent distress alert and oriented x3 mood and affect normal, dressed appropriately.  HEENT: Pupils equal, extraocular movements intact  Respiratory: Patient's speak in full sentences and does not appear short of breath  Cardiovascular: No lower extremity edema, non tender, no erythema  Skin: Warm dry intact with no signs of infection or rash on extremities or on axial skeleton.  Abdomen: Soft nontender  Neuro: Cranial nerves II through XII are intact, neurovascularly intact  in all extremities with 2+ DTRs and 2+ pulses.  Lymph: No lymphadenopathy of posterior or anterior cervical chain or axillae bilaterally.  Gait normal with good balance and coordination.  MSK:  Non tender with full range of motion and good stability and symmetric strength and tone of  elbows, wrist, hip, knee and ankles bilaterally.  Back Exam:  Inspection: Mild loss of lordosis Motion: Flexion 45 deg, Extension 25 deg, Side Bending to 45 deg bilaterally,  Rotation to 35 deg bilaterally  SLR laying: Negative  XSLR laying: Negative  Palpable tenderness: Tender to palpation in the paraspinal musculature lumbar spine right greater than left. FABER: negative. Sensory change: Gross sensation intact to all lumbar and sacral dermatomes.  Reflexes: 2+ at both patellar tendons, 2+ at achilles tendons, Babinski's downgoing.  Strength at foot  Plantar-flexion: 5/5 Dorsi-flexion: 5/5 Eversion: 5/5 Inversion: 5/5  Leg strength  Quad: 5/5 Hamstring: 5/5 Hip flexor: 5/5 Hip abductors: 5/5  Gait unremarkable.  Shoulder: Right Inspection reveals no abnormalities, atrophy or asymmetry. Palpation is normal with no tenderness over AC joint or bicipital groove. ROM is full in all planes passively. Rotator cuff strength normal throughout. signs of impingement with positive Neer and Hawkin's tests, but negative empty can sign. Speeds and Yergason's tests normal. No labral pathology noted with negative Obrien's, negative clunk and good stability. Normal scapular function observed. No painful arc and no drop arm sign. No apprehension sign  MSK US performed of: Right This study was ordered, performed, and interpreted by Terrilee FilesZach Telly Jawad D.O.  Shoulder:   Supraspinatus:  Appears normal on long and transverse views, Bursal bulge seen with shoulder abduction on impingement view. Infraspinatus:  Appears normal on long and transverse views. Significant increase in Doppler flow Subscapularis:  Appears normal on long  and transverse views. Positive bursa Teres Minor:  Appears normal on long and transverse views. AC joint:  Capsule undistended, no geyser sign. Glenohumeral Joint:  Appears normal without effusion. Glenoid Labrum:  Intact without visualized tears. Biceps Tendon:  Appears normal on long and transverse views, no fraying of tendon, tendon located in intertubercular groove, no subluxation with shoulder internal or external rotation.  Impression: Subacromial bursitis  Procedure: Real-time Ultrasound Guided Injection of right glenohumeral joint Device: GE Logiq E  Ultrasound guided injection is preferred based studies that show increased duration, increased effect, greater accuracy, decreased procedural pain, increased response rate with ultrasound guided versus blind injection.  Verbal informed consent obtained.  Time-out conducted.  Noted no overlying erythema, induration, or other signs of local infection.  Skin prepped in a sterile fashion.  Local anesthesia: Topical Ethyl chloride.  With sterile technique and under real time ultrasound guidance:  Joint visualized.  23g 1  inch  needle inserted posterior approach. Pictures taken for needle placement. Patient did have injection of 2 cc of 1% lidocaine, 2 cc of 0.5% Marcaine, and 1.0 cc of Kenalog 40 mg/dL. Completed without difficulty  Pain immediately resolved suggesting accurate placement of the medication.  Advised to call if fevers/chills, erythema, induration, drainage, or persistent bleeding.  Images permanently stored and available for review in the ultrasound unit.  Impression: Technically successful ultrasound guided injection.  Osteopathic findings C2 flexed rotated and side bent right T3 extended rotated and side bent right inhaled third rib T11 extended rotated and side bent left L2 flexed rotated and side bent right Sacrum right on right    Impression and Recommendations:     This case required medical decision making of  moderate complexity.      Note: This dictation was prepared with Dragon dictation along with smaller phrase technology. Any transcriptional errors that result from this process are unintentional.

## 2017-10-28 ENCOUNTER — Encounter: Payer: Self-pay | Admitting: Nurse Practitioner

## 2017-11-05 ENCOUNTER — Other Ambulatory Visit: Payer: Self-pay | Admitting: Family

## 2017-11-05 ENCOUNTER — Ambulatory Visit (HOSPITAL_COMMUNITY): Payer: BLUE CROSS/BLUE SHIELD | Admitting: Psychiatry

## 2017-11-05 ENCOUNTER — Encounter (HOSPITAL_COMMUNITY): Payer: Self-pay | Admitting: Psychiatry

## 2017-11-05 DIAGNOSIS — F902 Attention-deficit hyperactivity disorder, combined type: Secondary | ICD-10-CM

## 2017-11-05 DIAGNOSIS — R4689 Other symptoms and signs involving appearance and behavior: Secondary | ICD-10-CM | POA: Diagnosis not present

## 2017-11-05 DIAGNOSIS — Z630 Problems in relationship with spouse or partner: Secondary | ICD-10-CM | POA: Diagnosis not present

## 2017-11-05 DIAGNOSIS — F3162 Bipolar disorder, current episode mixed, moderate: Secondary | ICD-10-CM

## 2017-11-05 DIAGNOSIS — Z87891 Personal history of nicotine dependence: Secondary | ICD-10-CM

## 2017-11-05 DIAGNOSIS — Z79899 Other long term (current) drug therapy: Secondary | ICD-10-CM

## 2017-11-05 DIAGNOSIS — F6381 Intermittent explosive disorder: Secondary | ICD-10-CM

## 2017-11-05 DIAGNOSIS — I10 Essential (primary) hypertension: Secondary | ICD-10-CM

## 2017-11-05 MED ORDER — CARBAMAZEPINE 200 MG PO TABS: 200.0000 mg | ORAL_TABLET | Freq: Two times a day (BID) | ORAL | 3 refills | Status: DC

## 2017-11-05 MED ORDER — GUANFACINE HCL ER 3 MG PO TB24: 3.0000 mg | ORAL_TABLET | Freq: Two times a day (BID) | ORAL | 1 refills | Status: DC

## 2017-11-05 MED ORDER — LITHIUM CARBONATE ER 450 MG PO TBCR: 450.0000 mg | EXTENDED_RELEASE_TABLET | Freq: Two times a day (BID) | ORAL | 1 refills | Status: DC
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
## 2017-11-26 ENCOUNTER — Ambulatory Visit: Payer: BLUE CROSS/BLUE SHIELD | Admitting: Family Medicine

## 2017-11-26 ENCOUNTER — Encounter: Payer: Self-pay | Admitting: Family Medicine

## 2017-11-26 VITALS — BP 130/82 | HR 94 | Ht 67.0 in | Wt 204.0 lb

## 2017-11-26 DIAGNOSIS — M545 Low back pain: Secondary | ICD-10-CM

## 2017-11-26 DIAGNOSIS — M999 Biomechanical lesion, unspecified: Secondary | ICD-10-CM

## 2017-11-26 DIAGNOSIS — G8929 Other chronic pain: Secondary | ICD-10-CM

## 2017-11-26 NOTE — Progress Notes (Signed)
[name] [address] [place], [place] [address] Phone: [phone] Subjective:     CC: Back pain and neck pain follow-up  [id number]  Jacob [name] is a 32 y.o. male coming in with complaint of back and neck pain.  Patient has been seen previously.  Has responded very well to osteopathic manipulation.  Continues to work on Pharmacist, community.  Making some progress.  Denies any radiation of the arm.  Patient states that the neck pain is still there and has a palpable sensation but states that he is noticing increasing range of motion.     Past Medical History:  Diagnosis Date  . ADHD (attention deficit hyperactivity disorder) evaluation   . Allergy   . Bipolar disorder (HCC)    No past surgical history on file. Social History   Socioeconomic History  . Marital status: Married    Spouse name: Not on file  . Number of children: 0  . Years of education: 38  . Highest education level: Not on file  Occupational History  . Occupation: Asst Event organiser: [employer]  Social Needs  . Financial resource strain: Not on file  . Food insecurity:    Worry: Not on file    Inability: Not on file  . Transportation needs:    Medical: Not on file    Non-medical: Not on file  Tobacco Use  . Smoking status: Former Smoker    Packs/day: 0.00    Years: 9.00    Pack years: 0.00    Last attempt to quit: 12/2010    Years since quitting: 6.9  . Smokeless tobacco: Never Used  Substance and Sexual Activity  . Alcohol use: No    Comment: Rare  . Drug use: No  . Sexual activity: Yes    Partners: Female    Birth control/protection: Condom, Diaphragm  Lifestyle  . Physical activity:    Days per week: Not on file    Minutes per session: Not on file  . Stress: Not on file  Relationships  . Social connections:    Talks on phone: Not on file    Gets together: Not on file    Attends religious service: Not on file    Active member of club or  organization: Not on file    Attends meetings of clubs or organizations: Not on file    Relationship status: Not on file  Other Topics Concern  . Not on file  Social History Narrative   Born and raised in [place]. Currently live in private residence with wife [name]). 3 cats, 2 dogs.   Fun: Engineer, petroleum   Denies religious beliefs that would effect healthcare.    Allergies  Allergen Reactions  . Amlodipine Other (See Comments)    Head felt foggy   Family History  Problem Relation Age of Onset  . Cancer Mother      Past medical history, social, surgical and family history all reviewed in electronic medical record.  No pertanent information unless stated regarding to the chief complaint.   Review of Systems:Review of systems updated and as accurate as of 11/26/17  No headache, visual changes, nausea, vomiting, diarrhea, constipation, dizziness, abdominal pain, skin rash, fevers, chills, night sweats, weight loss, swollen lymph nodes, body aches, joint swelling, muscle aches, chest pain, shortness of breath, mood changes.   Objective  There were no vitals taken for this visit. Systems examined below as of 11/26/17  General: No apparent distress alert and oriented x3 mood and affect normal, dressed appropriately.  HEENT: Pupils equal, extraocular movements intact  Respiratory: Patient's speak in full sentences and does not appear short of breath  Cardiovascular: No lower extremity edema, non tender, no erythema  Skin: Warm dry intact with no signs of infection or rash on extremities or on axial skeleton.  Abdomen: Soft nontender  Neuro: Cranial nerves II through XII are intact, neurovascularly intact in all extremities with 2+ DTRs and 2+ pulses.  Lymph: No lymphadenopathy of posterior or anterior cervical chain or axillae bilaterally.  Gait normal with good balance and coordination.  MSK:  Non tender with full range of motion and good stability and symmetric  strength and tone of shoulders, elbows, wrist, hip, knee and ankles bilaterally.  Neck: Inspection mild loss of lordosis. No palpable stepoffs. Negative Spurling's maneuver. Mild limitation in all planes lacking last 5 degrees of extension mild tightness with right-sided rotation Grip strength and sensation normal in bilateral hands Strength good C4 to T1 distribution No sensory change to C4 to T1 Negative Hoffman sign bilaterally Reflexes normal  Lower back shows some mild loss of lordosis.  Otherwise fairly unremarkable  Osteopathic findings C2 flexed rotated and side bent right C4 flexed rotated and side bent left T9 extended rotated and side bent left L2 flexed rotated and side bent right Sacrum right on right     Impression and Recommendations:     This case required medical decision making of moderate complexity.      Note: This dictation was prepared with Dragon dictation along with smaller phrase technology. Any transcriptional errors that result from this process are unintentional.

## 2017-11-26 NOTE — Assessment & Plan Note (Signed)
Decision today to treat with OMT was based on Physical Exam  After verbal consent patient was treated with HVLA, ME, FPR techniques in  thoracic, lumbar and sacral areas  Patient tolerated the procedure well with improvement in symptoms  Patient given exercises, stretches and lifestyle modifications  See medications in patient instructions if given  Patient will follow up in 4-8 weeks 

## 2017-11-26 NOTE — Patient Instructions (Signed)
Good to see you  Gustavus Bryant is your friend.  Keep it up  Continue the vitamins See me again in 2 months

## 2017-11-26 NOTE — Assessment & Plan Note (Signed)
Stable overall.  Still has some muscle tightness.  Did not feel that we need to make any significant changes in management.  Patient responded well to osteopathic manipulation.  Patient will continue this.  Follow-up again in 4 to 8 weeks

## 2017-11-27 ENCOUNTER — Ambulatory Visit: Payer: BLUE CROSS/BLUE SHIELD | Admitting: Internal Medicine

## 2017-11-27 ENCOUNTER — Encounter: Payer: Self-pay | Admitting: Internal Medicine

## 2017-11-27 ENCOUNTER — Other Ambulatory Visit (INDEPENDENT_AMBULATORY_CARE_PROVIDER_SITE_OTHER): Payer: BLUE CROSS/BLUE SHIELD

## 2017-11-27 ENCOUNTER — Telehealth: Payer: Self-pay | Admitting: Internal Medicine

## 2017-11-27 ENCOUNTER — Telehealth: Payer: Self-pay | Admitting: Nurse Practitioner

## 2017-11-27 VITALS — BP 128/88 | HR 95 | Ht 67.0 in | Wt 201.2 lb

## 2017-11-27 DIAGNOSIS — I10 Essential (primary) hypertension: Secondary | ICD-10-CM

## 2017-11-27 DIAGNOSIS — R05 Cough: Secondary | ICD-10-CM

## 2017-11-27 DIAGNOSIS — R053 Chronic cough: Secondary | ICD-10-CM

## 2017-11-27 DIAGNOSIS — J4540 Moderate persistent asthma, uncomplicated: Secondary | ICD-10-CM | POA: Diagnosis not present

## 2017-11-27 LAB — CBC WITH DIFFERENTIAL/PLATELET
BASOS ABS: 0.1 10*3/uL (ref 0.0–0.1)
BASOS PCT: 1.4 % (ref 0.0–3.0)
EOS ABS: 0.5 10*3/uL (ref 0.0–0.7)
EOS PCT: 6 % — AB (ref 0.0–5.0)
HCT: 48.1 % (ref 39.0–52.0)
HEMOGLOBIN: 16.4 g/dL (ref 13.0–17.0)
LYMPHS ABS: 2.2 10*3/uL (ref 0.7–4.0)
Lymphocytes Relative: 25 % (ref 12.0–46.0)
MCHC: 34.2 g/dL (ref 30.0–36.0)
MCV: 96.4 fl (ref 78.0–100.0)
Monocytes Absolute: 0.5 10*3/uL (ref 0.1–1.0)
Monocytes Relative: 6.1 % (ref 3.0–12.0)
NEUTROS PCT: 61.5 % (ref 43.0–77.0)
Neutro Abs: 5.3 10*3/uL (ref 1.4–7.7)
Platelets: 329 10*3/uL (ref 150.0–400.0)
RBC: 4.99 Mil/uL (ref 4.22–5.81)
RDW: 12.2 % (ref 11.5–15.5)
WBC: 8.7 10*3/uL (ref 4.0–10.5)

## 2017-11-27 LAB — NITRIC OXIDE: NITRIC OXIDE: 15

## 2017-11-27 MED ORDER — NIFEDIPINE ER OSMOTIC RELEASE 30 MG PO TB24: 30.0000 mg | ORAL_TABLET | Freq: Every day | ORAL | 1 refills | Status: DC

## 2017-11-27 MED ORDER — FLUTICASONE FUROATE-VILANTEROL 200-25 MCG/INH IN AEPB: 1.0000 | INHALATION_SPRAY | Freq: Every day | RESPIRATORY_TRACT | 0 refills | Status: DC

## 2017-11-27 NOTE — Patient Instructions (Addendum)
ICD-10-CM   1. Moderate persistent asthma, unspecified whether complicated J45.40   2. Chronic cough R05     Unclear If cough is due to poor control of asthma or some othe rproblem I understand you are only taking your symbicort half the time  Plan - change symbicort to high dose breo - this is once daily - you can take it at night   - this can simplify your regimen for better compliance  - take samples if we have it - use albuterol as needed - check CBC with diff and blood IgE level 11/27/2017 - hold of CXR for now - do full PFT - will write to PCP Evaristo Bury, NP to see if any other BP med other than lopressor   Followup - next few weeks but after completing above - can see APP or Dr Marchelle Gearing  -might need eNT eval if above non-contribuitor

## 2017-11-27 NOTE — Addendum Note (Signed)
Addended by: Wyvonne Lenz on: 11/27/2017 01:30 PM   Modules accepted: Orders

## 2017-11-27 NOTE — Progress Notes (Signed)
Subjective:     Patient ID: Jacob [name], male   DOB: [birth year], 32 y.o.   MRN: [id number]  PCP Jacob Bury, NP  HPI  IOV 11/27/2017  Chief Complaint  Patient presents with  . Consult    Referred by Jacob [name]. Pt states he had asthma as a child. Has had complaints of cough with SOB and has had to use albuterol to help. Pt has had some occ wheezing and some pain in rib cage on left side.   32 year old male referred by primary care for cough and wheezing in the setting of asthma.  He tells me that he has had asthma all his life because he remembers nebulizers but never had an admission to 2015 when he was then in the [place] area and admitted to [facility] in [facility] with a severe asthma exacerbation.  Since 2017 he has been on Symbicort with our primary care physicians.  Prior to that he was not on any maintenance inhaler therapy at all.  He has had some modest improvement in overall symptomatology particularly with cough ever since being on Symbicort.  However he admits to noncompliance on account of his ADHD and difficulty remembering.  He only takes his Symbicort once daily at night and even that he forgets occasionally.  It appears that he is on Lopressor for hypertension because other antihypertensives appear to interfere with his CNS medications.  He tells me that he has a cough despite his Symbicort and as best as I can gather after repeated lines of questioning this happens to happen every few days.  He only results to albuterol use once a month.  He does not use the albuterol for the cough so we do not know the response of cough to the albuterol.  He does not have any nocturnal symptoms.  He does not clear his throat.  His exhaled nitric oxide test today is 15 ppb and normal.   Asthma control questionnaire: Symptom severity is very mild.  And actually suggests good control - score 0.2 out of 5.  In particular he says he is hardly ever getting up at  night because of asthma and when he gets up he is only very mild symptoms.  He says his activities are not limited at all and he does not experience any shortness of breath or any wheezing and does not use albuterol for rescue.  Current medications include scheduled Symbicort.  His last prednisone burst was in 2018.  He says he requires prednisone only once a year on average  Visualization of imaging in 2017 he had a chest x-ray that suggested hilar fullness.  This was followed by CT scan on April 09, 2016 that I personally visualized and this looks normal to me.  And agree with the official report.  There is no hilar mass or filling  Home situation: He has dogs and cats  Allergy history: He has baseline peripheral eosinophilia as documented below.  He has been allergy tested several years ago and he does not know where and he says he was significantly positive on skin testing.  No allergy testing documentation IgE is available in our computer system.  Results for Jacob, [name] (MRN [id number]) as of 11/27/2017 09:44  Ref. Range 08/07/2016 10:42  Eosinophils Absolute Latest Ref Range: 0.0 - 0.7 K/uL 0.4   Results for Jacob, [name] (MRN [id number]) as of 11/27/2017 09:44  Ref. Range 03/05/2017 09:10 06/20/2017 11:49  EOS (ABSOLUTE) Latest Ref  Range: 0.0 - 0.4 x10E3/uL 1.1 (H) 0.5 (H)      Asthma Control Panel eos [phone] in 2018 Hx of allergy testing + 11/27/2017   Current Med Regimen symbicort - 50% compliance or less  ACQ 5 point- 1 week. wtd avg score. <1.0 is good control 0.75-1.25 is grey zone. >1.25 poor control. Delta 0.5 is clinically meaningful 0.2  FeNO ppB 15 ppb and normal - has wheeze on exam  FeV1    Planned intervention  for visit testing      has a past medical history of ADHD (attention deficit hyperactivity disorder) evaluation, Allergy, and Bipolar disorder (HCC).   reports that he quit smoking about 6 years ago. His smoking use included cigarettes. He has a 4.50  pack-year smoking history. He has never used smokeless tobacco.  No past surgical history on file.  Allergies  Allergen Reactions  . Amlodipine Other (See Comments)    Head felt foggy    Immunization History  Administered Date(s) Administered  . Influenza,inj,Quad PF,6+ Mos 05/05/2014, 04/30/2016, 06/06/2017  . Tdap 04/30/2016    Family History  Problem Relation Age of Onset  . Cancer Mother      Current Outpatient Medications:  .  albuterol (PROVENTIL HFA;VENTOLIN HFA) 108 (90 Base) MCG/ACT inhaler, Inhale 2 puffs into the lungs every 6 (six) hours as needed for wheezing or shortness of breath., Disp: 1 Inhaler, Rfl: 2 .  b complex vitamins tablet, Take 1 tablet by mouth daily., Disp: , Rfl:  .  budesonide-formoterol (SYMBICORT) 160-4.5 MCG/ACT inhaler, Inhale 2 puffs into the lungs 2 (two) times daily. Must keep appt w/new provider for future refills, Disp: 3 Inhaler, Rfl: 3 .  carbamazepine (TEGRETOL) 200 MG tablet, Take 1 tablet (200 mg total) by mouth 2 (two) times daily., Disp: 60 tablet, Rfl: 3 .  Diclofenac Sodium (PENNSAID) 2 % SOLN, Place 2 g onto the skin 2 (two) times daily., Disp: 112 g, Rfl: 3 .  GuanFACINE HCl 3 MG TB24, Take 1 tablet (3 mg total) by mouth 2 (two) times daily. 6 mg total daily, Disp: 180 tablet, Rfl: 1 .  lisdexamfetamine (VYVANSE) 70 MG capsule, Take 1 capsule (70 mg total) by mouth daily., Disp: 90 capsule, Rfl: 0 .  lithium carbonate (ESKALITH) 450 MG CR tablet, Take 1 tablet (450 mg total) by mouth 2 (two) times daily., Disp: 180 tablet, Rfl: 1 .  metoprolol succinate (TOPROL-XL) 25 MG 24 hr tablet, TAKE 1 TABLET BY MOUTH EVERY DAY, Disp: 30 tablet, Rfl: 1 .  tizanidine (ZANAFLEX) 2 MG capsule, TAKE 1 CAPSULE BY MOUTH THREE TIMES A DAY, Disp: 90 capsule, Rfl: 0 .  chlorproMAZINE (THORAZINE) 50 MG tablet, Take 1 tablet (50 mg total) by mouth daily as needed (for agitation). (Patient not taking: Reported on 11/27/2017), Disp: 90 tablet, Rfl: 1 .   Vitamin D, Ergocalciferol, (DRISDOL) 50000 units CAPS capsule, TAKE ONE CAPSULE BY MOUTH EVERY 7 DAYS (Patient not taking: Reported on 11/27/2017), Disp: 12 capsule, Rfl: 0   Review of Systems     Objective:   Physical Exam  Constitutional: He is oriented to person, place, and time. He appears well-developed and well-nourished. No distress.  HENT:  Head: Normocephalic and atraumatic.  Right Ear: External ear normal.  Left Ear: External ear normal.  Mouth/Throat: Oropharynx is clear and moist. No oropharyngeal exudate.  Eyes: Pupils are equal, round, and reactive to light. Conjunctivae and EOM are normal. Right eye exhibits no discharge. Left eye exhibits no discharge.  No scleral icterus.  Neck: Normal range of motion. Neck supple. No JVD present. No tracheal deviation present. No thyromegaly present.  Cardiovascular: Normal rate, regular rhythm and intact distal pulses. Exam reveals no gallop and no friction rub.  No murmur heard. Pulmonary/Chest: Effort normal. No respiratory distress. He has wheezes. He has no rales. He exhibits no tenderness.  Scattered end inspiratory wheezes but diffuse bilateral  Abdominal: Soft. Bowel sounds are normal. He exhibits no distension and no mass. There is no tenderness. There is no rebound and no guarding.  Musculoskeletal: Normal range of motion. He exhibits no edema or tenderness.  Lymphadenopathy:    He has no cervical adenopathy.  Neurological: He is alert and oriented to person, place, and time. He has normal reflexes. No cranial nerve deficit. Coordination normal.  Skin: Skin is warm and dry. No rash noted. He is not diaphoretic. No erythema. No pallor.  Psychiatric: He has a normal mood and affect. His behavior is normal. Judgment and thought content normal.  Nursing note and vitals reviewed.  Vitals:   11/27/17 0944  BP: 128/88  Pulse: 95  SpO2: 100%  Weight: 201 lb 3.2 oz (91.3 kg)  Height:  (1.702 m)    Estimated body mass index  is 31.51 kg/m as calculated from the following:   Height as of this encounter:  (1.702 m).   Weight as of this encounter: 201 lb 3.2 oz (91.3 kg).     Assessment:       ICD-10-CM   1. Moderate persistent asthma, unspecified whether complicated J45.40 CBC w/Diff    Pulmonary function test    IgE  2. Chronic cough R05 CBC w/Diff    Pulmonary function test    IgE      Plan:      Asthma itself appears well controlled based on asthma control questionnaire score and exam nitric oxide score.  I suspect the consult is really for the slight excess and cough that he has and also the scattered inspiratory wheeze he has on exam.  He has previously normal imaging in 2017. Unclear If cough is due to poor control of asthma or some othe rproblem I understand you are only taking your symbicort half the time  Plan - change symbicort to high dose breo - this is once daily - you can take it at night   - this can simplify your regimen for better compliance  - take samples if we have it - use albuterol as needed - check CBC with diff and blood IgE level 11/27/2017 - hold of CXR for now - do full PFT - will write to PCP Jacob Bury, NP to see if any other BP med other than lopressor   Followup - next few weeks but after completing above - can see APP or Dr Marchelle Gearing  -might need eNT eval if above non-contribuitor    Dr. Kalman Shan, M.D., [facility].C.P Pulmonary and Critical Care Medicine Staff Physician, [facility] Center Director - Interstitial Lung Disease  Program  Pulmonary Fibrosis [facility] at [facility] [place], [place], [address]  Pager: [phone], If no answer or between  15:00h - 7:00h: call [phone] Telephone: [phone]

## 2017-11-27 NOTE — Progress Notes (Signed)
   Subjective:    Patient ID: Jacob [name], male    DOB: Mar 21, 1986, 32 y.o.   MRN: [id number]  HPI    Review of Systems  Constitutional: Negative for fever and unexpected weight change.  HENT: Positive for postnasal drip, sinus pressure and sneezing. Negative for congestion, dental problem, ear pain, nosebleeds, rhinorrhea, sore throat and trouble swallowing.   Eyes: Negative for redness and itching.  Respiratory: Positive for cough, shortness of breath and wheezing. Negative for chest tightness.   Cardiovascular: Negative for palpitations and leg swelling.  Gastrointestinal: Negative for nausea and vomiting.  Genitourinary: Negative for dysuria.  Musculoskeletal: Negative for joint swelling.  Skin: Negative for rash.  Allergic/Immunologic: Positive for environmental allergies. Negative for food allergies and immunocompromised state.  Neurological: Positive for headaches.  Hematological: Does not bruise/bleed easily.  Psychiatric/Behavioral: Negative for dysphoric mood. The patient is nervous/anxious.        Objective:   Physical Exam        Assessment & Plan:

## 2017-11-27 NOTE — Telephone Encounter (Signed)
Hello Evaristo Bury, NP   Is it possible to get Manning Charity on a bP med that is not a beta blocker at all. IF not possible due to his ADHD meds then maybe bisoprolol could be a cnsideration - most b1 selective.  Thanks  Dr. Kalman Shan, M.D., [facility].C.P Pulmonary and Critical Care Medicine Staff Physician, [facility] Center Director - Interstitial Lung Disease  Program  Pulmonary Fibrosis [facility] at [facility] [place], [place], [address]  Pager: [phone], If no answer or between  15:00h - 7:00h: call [phone] Telephone: [phone]

## 2017-11-27 NOTE — Telephone Encounter (Signed)
We are going to change his blood pressure medication, his lung doctor recommended this to see if it helps his asthma He should slowly stop toprol. Please break toprol tablet in half and take 1/2 daily for 3 days then every other day for 2 days then stop. I have sent a new prescription for his blood pressure called nifedipine 30 mg once daily. Please begin taking the nifedipine 30 once daily Please return to me in about 2 weeks so I can see how he is doing on the new

## 2017-11-27 NOTE — Telephone Encounter (Signed)
Copied from CRM [id number]. Topic: Quick Communication - Rx Refill/Question >> Nov 27, 2017  6:00 PM Alexander Bergeron B wrote: Medication: NIFEdipine (PROCARDIA-XL/ADALAT-CC/NIFEDICAL-XL) 30 MG 24 hr tablet [[id number]]   Pharmacy called about the medication above; the medication above cannot be taken while the pt is taking carbamazepine (TEGRETOL) 200 MG tablet [[id number]] ; call pharmacist if needed to resolve on another medication

## 2017-11-28 ENCOUNTER — Telehealth: Payer: Self-pay | Admitting: Internal Medicine

## 2017-11-28 LAB — IGE: IgE (Immunoglobulin E), Serum: 68 kU/L (ref <=114)

## 2017-11-28 MED ORDER — NEBIVOLOL HCL 5 MG PO TABS: 5.0000 mg | ORAL_TABLET | Freq: Every day | ORAL | 1 refills | Status: DC

## 2017-11-28 NOTE — Telephone Encounter (Signed)
Please tell him that the pharmacy let me know that the nifedipine I sent yesterday could also react with his other medications, so we will not be starting this medication I am going to send a different medication called bystolic  once daily to try. This medication can be expensive, but he can go online to the bystolic website and sign up for free for a coupon which gives a good discount. I am sorry for all of the medication changes, but we need to find something that will not interact with his medications or worsen his asthma. I told him to come in for an OV in 2 weeks for follow up BP reading, he can actually just make that a nurse visit to have his blood pressure checked so we can see how his blood pressure is doing on the new medication.

## 2017-11-28 NOTE — Telephone Encounter (Signed)
Blood eos high Blood IgE normal  In fuiture he will qualify for a biologic but for now no change. Will see him at followup and discuss. Let him know

## 2017-11-28 NOTE — Telephone Encounter (Signed)
Hey Dr. Marchelle Gearing,  I am running into multiple interactions in trying to prescribe Jacob [name] blood pressure medications. Do you think it would be okay to try him on bystolic ?  Thanks,  Alphonse Guild, NP, [facility]

## 2017-11-28 NOTE — Telephone Encounter (Signed)
Bystolic/bisoprolol sort of same I think. YEs bystolic supposedly more beta 1 specific than metoprolol. So yes let us go for it

## 2017-11-28 NOTE — Telephone Encounter (Signed)
Please advise 

## 2017-11-28 NOTE — Telephone Encounter (Signed)
Left detailed message with response below. Asked him to call back with any questions or concerns if needed.

## 2017-11-28 NOTE — Telephone Encounter (Signed)
Great, Thanks! Hope you enjoy the long weekend :)

## 2017-12-02 ENCOUNTER — Other Ambulatory Visit: Payer: Self-pay | Admitting: Family

## 2017-12-02 DIAGNOSIS — I10 Essential (primary) hypertension: Secondary | ICD-10-CM

## 2017-12-03 NOTE — Telephone Encounter (Signed)
lmtcb for pt.  

## 2017-12-11 NOTE — Telephone Encounter (Signed)
Called and spoke with pt letting him know the results of the labwork.  Pt expressed understanding. Nothing further needed.

## 2017-12-30 ENCOUNTER — Encounter: Payer: Self-pay | Admitting: Internal Medicine

## 2017-12-30 ENCOUNTER — Ambulatory Visit (INDEPENDENT_AMBULATORY_CARE_PROVIDER_SITE_OTHER): Payer: BLUE CROSS/BLUE SHIELD | Admitting: Internal Medicine

## 2017-12-30 ENCOUNTER — Telehealth: Payer: Self-pay | Admitting: Internal Medicine

## 2017-12-30 ENCOUNTER — Ambulatory Visit: Payer: BLUE CROSS/BLUE SHIELD | Admitting: Internal Medicine

## 2017-12-30 VITALS — BP 122/70 | HR 75 | Ht 67.12 in | Wt 208.8 lb

## 2017-12-30 DIAGNOSIS — J4540 Moderate persistent asthma, uncomplicated: Secondary | ICD-10-CM

## 2017-12-30 DIAGNOSIS — R053 Chronic cough: Secondary | ICD-10-CM

## 2017-12-30 DIAGNOSIS — R05 Cough: Secondary | ICD-10-CM

## 2017-12-30 LAB — PULMONARY FUNCTION TEST
DL/VA % PRED: 126 %
DL/VA: 5.69 ml/min/mmHg/L
DLCO COR % PRED: 140 %
DLCO cor: 40.09 ml/min/mmHg
DLCO unc % pred: 147 %
DLCO unc: 41.98 ml/min/mmHg
FEF 25-75 POST: 3.91 L/s
FEF 25-75 Pre: 3.75 L/sec
FEF2575-%CHANGE-POST: 4 %
FEF2575-%PRED-POST: 93 %
FEF2575-%Pred-Pre: 90 %
FEV1-%CHANGE-POST: 2 %
FEV1-%PRED-POST: 108 %
FEV1-%Pred-Pre: 105 %
FEV1-Post: 4.42 L
FEV1-Pre: 4.3 L
FEV1FVC-%Change-Post: 0 %
FEV1FVC-%PRED-PRE: 95 %
FEV6-%Change-Post: 2 %
FEV6-%Pred-Post: 114 %
FEV6-%Pred-Pre: 111 %
FEV6-PRE: 5.46 L
FEV6-Post: 5.61 L
FEV6FVC-%Change-Post: 0 %
FEV6FVC-%PRED-PRE: 100 %
FEV6FVC-%Pred-Post: 100 %
FVC-%Change-Post: 2 %
FVC-%PRED-POST: 112 %
FVC-%PRED-PRE: 110 %
FVC-POST: 5.64 L
FVC-PRE: 5.5 L
POST FEV1/FVC RATIO: 78 %
PRE FEV6/FVC RATIO: 99 %
Post FEV6/FVC ratio: 100 %
Pre FEV1/FVC ratio: 78 %
RV % pred: 161 %
RV: 2.39 L
TLC % pred: 159 %
TLC: 10.1 L

## 2017-12-30 LAB — NITRIC OXIDE: Nitric Oxide: 18

## 2017-12-30 NOTE — Progress Notes (Signed)
PFT completed today. 12/30/17  

## 2017-12-30 NOTE — Patient Instructions (Signed)
ICD-10-CM   1. Asthma, moderate persistent, well-controlled J45.40     Glad better with breo  Plan Continue breo daily - no matter what - keep it near your toothbrush so you do not forget Albuterol as needed Flu shot in fall  Followup 9 months or sooner if needed; ACQ/FeNO at followup

## 2017-12-30 NOTE — Progress Notes (Signed)
Subjective:     Patient ID: Jacob [name], male   DOB: 1985-08-01, 32 y.o.   MRN: [id number]  HPI PCP Jacob BuryShambley, [name], NP  HPI  IOV 11/27/2017  Chief Complaint  Patient presents with  . Consult    Referred by Jacob GuildAshleigh Shambley. Pt states he had asthma as a child. Has had complaints of cough with SOB and has had to use albuterol to help. Pt has had some occ wheezing and some pain in rib cage on left side.   32 year old male referred by primary care for cough and wheezing in the setting of asthma.  He tells me that he has had asthma all his life because he remembers nebulizers but never had an admission to 2015 when [place] was then in the [place] and admitted [facility] [place] with a severe asthma exacerbation.  Since 2017 he has been on Symbicort with our primary care physicians.  Prior to that he was not on any maintenance inhaler therapy at all.  He has had some modest improvement in overall symptomatology particularly with cough ever since being on Symbicort.  However he admits to noncompliance on account of his ADHD and difficulty remembering.  He only takes his Symbicort once daily at night and even that he forgets occasionally.  It appears that he is on Lopressor for hypertension because other antihypertensives appear to interfere with his CNS medications.  He tells me that he has a cough despite his Symbicort and as best as I can gather after repeated lines of questioning this happens to happen every few days.  He only results to albuterol use once a month.  He does not use the albuterol for the cough so we do not know the response of cough to the albuterol.  He does not have any nocturnal symptoms.  He does not clear his throat.  His exhaled nitric oxide test today is 15 ppb and normal.   Asthma control questionnaire: Symptom severity is very mild.  And actually suggests good control - score 0.2 out of 5.  In particular he says he is hardly ever getting  up at night because of asthma and when he gets up he is only very mild symptoms.  He says his activities are not limited at all and he does not experience any shortness of breath or any wheezing and does not use albuterol for rescue.  Current medications include scheduled Symbicort.  His last prednisone burst was in 2018.  He says he requires prednisone only once a year on average  Visualization of imaging in 2017 he had a chest x-ray that suggested hilar fullness.  This was followed by CT scan on April 09, 2016 that I personally visualized and this looks normal to me.  And agree with the official report.  There is no hilar mass or filling  Home situation: He has dogs and cats  Allergy history: He has baseline peripheral eosinophilia as documented below.  He has been allergy tested several years ago and he does not know where and he says he was significantly positive on skin testing.  No allergy testing documentation IgE is available in our computer system.   OV 12/30/2017  Chief Complaint  Patient presents with  . Follow-up    PFT today     Follow-up asthma workup. At last visit there was moderate evidence of asthma based on history and also wheezing on exam. Although his nitric oxide was normal. I switched his  Symbicort to Brio and emphasize compliance. With this he tells me is significantly better although on the asthma control questionnaire score is 1.0. He is not using his albuterol for rescue but he says he is waking up a few times at night and when he wakes up he is very mild symptoms but he is not limited in his activitiesand he is not expressing any shortness of breath and is wheezing hardly any of the time. His pulmonary function test today is normal except for elevated residual volume and diffusion capacity consistent with the asthma.he did admit that he is not fully compliant with his Brio but he is more compliant compared to his Symbicort because it's once a day technique.  Periodically he forgets.Renal his blood pressure medication has been changed to bisoprolol. Exam does not have any wheeze.    Asthma Control Panel eos 0 [phone] in 2018, 500 - [id number] Hx of allergy testing + 11/27/2017  12/30/2017   Current Med Regimen symbicort - 50% compliance or less breo > 50% compliance  ACQ 5 point- 1 week. wtd avg score. <1.0 is good control 0.75-1.25 is grey zone. >1.25 poor control. Delta 0.5 is clinically meaningful 0.2 1.0  FeNO ppB 15 ppb and normal - has wheeze on exam x  FeV1   Normal but DLCO high, RV  Planned intervention  for visit testing    Results for OMA, ALPERT (MRN [id number]) as of 12/30/2017 11:05  Ref. Range 08/07/2016 10:42 03/05/2017 09:10 06/20/2017 11:49 11/27/2017 10:22  Eosinophils Absolute Latest Ref Range: 0.0 - 0.7 K/uL 0.4   0.5    Results for Jacob [name] (MRN [id number]) as of 12/30/2017 11:05  Ref. Range 11/27/2017 10:22  IgE (Immunoglobulin E), Serum Latest Ref Range: <OR=114 kU/L 68    has a past medical history of ADHD (attention deficit hyperactivity disorder) evaluation, Allergy, and Bipolar disorder (HCC).   reports that he quit smoking about 7 years ago. His smoking use included cigarettes. He has a 4.50 pack-year smoking history. He has never used smokeless tobacco.  No past surgical history on file.  Allergies  Allergen Reactions  . Amlodipine Other (See Comments)    Head felt foggy    Immunization History  Administered Date(s) Administered  . Influenza,inj,Quad PF,6+ Mos 05/05/2014, 04/30/2016, 06/06/2017  . Tdap 04/30/2016    Family History  Problem Relation Age of Onset  . Cancer Mother      Current Outpatient Medications:  .  albuterol (PROVENTIL HFA;VENTOLIN HFA) 108 (90 Base) MCG/ACT inhaler, Inhale 2 puffs into the lungs every 6 (six) hours as needed for wheezing or shortness of breath., Disp: 1 Inhaler, Rfl: 2 .  b complex vitamins tablet, Take 1 tablet by mouth daily., Disp: , Rfl:  .   carbamazepine (TEGRETOL) 200 MG tablet, Take 1 tablet (200 mg total) by mouth 2 (two) times daily., Disp: 60 tablet, Rfl: 3 .  chlorproMAZINE (THORAZINE) 50 MG tablet, Take 1 tablet (50 mg total) by mouth daily as needed (for agitation)., Disp: 90 tablet, Rfl: 1 .  Diclofenac Sodium (PENNSAID) 2 % SOLN, Place 2 g onto the skin 2 (two) times daily., Disp: 112 g, Rfl: 3 .  fluticasone furoate-vilanterol (BREO ELLIPTA) 200-25 MCG/INH AEPB, Inhale 1 puff into the lungs daily., Disp: 2 each, Rfl: 0 .  GuanFACINE HCl 3 MG TB24, Take 1 tablet (3 mg total) by mouth 2 (two) times daily. 6 mg total daily, Disp: 180 tablet, Rfl: 1 .  lisdexamfetamine (VYVANSE) 70 MG capsule,  Take 1 capsule (70 mg total) by mouth daily., Disp: 90 capsule, Rfl: 0 .  lithium carbonate (ESKALITH) 450 MG CR tablet, Take 1 tablet (450 mg total) by mouth 2 (two) times daily., Disp: 180 tablet, Rfl: 1 .  nebivolol (BYSTOLIC) 5 MG tablet, Take 1 tablet (5 mg total) by mouth daily., Disp: 30 tablet, Rfl: 1 .  tizanidine (ZANAFLEX) 2 MG capsule, TAKE 1 CAPSULE BY MOUTH THREE TIMES A DAY, Disp: 90 capsule, Rfl: 0    Review of Systems     Objective:   Physical Exam  Constitutional: He is oriented to person, place, and time. He appears well-developed and well-nourished. No distress.  HENT:  Head: Normocephalic and atraumatic.  Right Ear: External ear normal.  Left Ear: External ear normal.  Mouth/Throat: Oropharynx is clear and moist. No oropharyngeal exudate.  Eyes: Pupils are equal, round, and reactive to light. Conjunctivae and EOM are normal. Right eye exhibits no discharge. Left eye exhibits no discharge. No scleral icterus.  Neck: Normal range of motion. Neck supple. No JVD present. No tracheal deviation present. No thyromegaly present.  Cardiovascular: Normal rate, regular rhythm and intact distal pulses. Exam reveals no gallop and no friction rub.  No murmur heard. Pulmonary/Chest: Effort normal and breath sounds normal.  No respiratory distress. He has no wheezes. He has no rales. He exhibits no tenderness.  Abdominal: Soft. Bowel sounds are normal. He exhibits no distension and no mass. There is no tenderness. There is no rebound and no guarding.  Musculoskeletal: Normal range of motion. He exhibits no edema or tenderness.  Lymphadenopathy:    He has no cervical adenopathy.  Neurological: He is alert and oriented to person, place, and time. He has normal reflexes. No cranial nerve deficit. Coordination normal.  Skin: Skin is warm and dry. No rash noted. He is not diaphoretic. No erythema. No pallor.  Psychiatric: He has a normal mood and affect. His behavior is normal. Judgment and thought content normal.  Nursing note and vitals reviewed.  Vitals:   12/30/17 1048  BP: 122/70  Pulse: 75  SpO2: 100%  Weight: 208 lb 12.8 oz (94.7 kg)  Height: 5' 7.12" (1.705 m)    Estimated body mass index is 32.59 kg/m as calculated from the following:   Height as of this encounter: 5' 7.12" (1.705 m).   Weight as of this encounter: 208 lb 12.8 oz (94.7 kg).     Assessment:       ICD-10-CM   1. Asthma, moderate persistent, well-controlled J45.40    Based on history and also hyperinflation and air trapping on pulmonary function tests and improvement with Brioand peripheral eosinophilia begins in that he does have asthma. His IgE is normal. He seems to be doing better with Brio so we will continue with the same. Emphasized compliance    Plan:      Glad better with breo  Plan Continue breo daily - no matter what - keep it near your toothbrush so you do not forget Albuterol as needed Flu shot in fall  Followup 9 months or sooner if needed; ACQ/FeNO at followup    Dr. Kalman Shan, M.D., [facility].C.P Pulmonary and Critical Care Medicine Staff Physician, [facility] Center Director - Interstitial Lung Disease  Program  Pulmonary Fibrosis [facility] at [facility] [place], [place], [address]  Pager: [phone], If no answer or between  15:00h - 7:00h: call [phone] Telephone: [phone]

## 2017-12-31 ENCOUNTER — Other Ambulatory Visit: Payer: Self-pay | Admitting: Nurse Practitioner

## 2017-12-31 ENCOUNTER — Encounter (HOSPITAL_COMMUNITY): Payer: Self-pay | Admitting: Psychiatry

## 2017-12-31 ENCOUNTER — Ambulatory Visit (HOSPITAL_COMMUNITY): Payer: BLUE CROSS/BLUE SHIELD | Admitting: Psychiatry

## 2017-12-31 DIAGNOSIS — F6381 Intermittent explosive disorder: Secondary | ICD-10-CM

## 2017-12-31 DIAGNOSIS — F3162 Bipolar disorder, current episode mixed, moderate: Secondary | ICD-10-CM

## 2017-12-31 DIAGNOSIS — R4689 Other symptoms and signs involving appearance and behavior: Secondary | ICD-10-CM

## 2017-12-31 DIAGNOSIS — F902 Attention-deficit hyperactivity disorder, combined type: Secondary | ICD-10-CM | POA: Diagnosis not present

## 2017-12-31 DIAGNOSIS — Z87891 Personal history of nicotine dependence: Secondary | ICD-10-CM | POA: Diagnosis not present

## 2017-12-31 DIAGNOSIS — I10 Essential (primary) hypertension: Secondary | ICD-10-CM

## 2017-12-31 MED ORDER — CARBAMAZEPINE 200 MG PO TABS: 200.0000 mg | ORAL_TABLET | Freq: Two times a day (BID) | ORAL | 0 refills | Status: DC

## 2017-12-31 MED ORDER — LISDEXAMFETAMINE DIMESYLATE 70 MG PO CAPS: 70.0000 mg | ORAL_CAPSULE | Freq: Every day | ORAL | 0 refills | Status: DC
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
## 2018-01-02 ENCOUNTER — Encounter: Payer: Self-pay | Admitting: Nurse Practitioner

## 2018-01-02 ENCOUNTER — Ambulatory Visit: Payer: BLUE CROSS/BLUE SHIELD | Admitting: Nurse Practitioner

## 2018-01-02 DIAGNOSIS — I10 Essential (primary) hypertension: Secondary | ICD-10-CM | POA: Diagnosis not present

## 2018-01-02 MED ORDER — NEBIVOLOL HCL 5 MG PO TABS: 5.0000 mg | ORAL_TABLET | Freq: Every day | ORAL | 1 refills | Status: DC

## 2018-01-02 NOTE — Patient Instructions (Signed)
I have sent 90 day supply of your bystolic  Please continue to monitor blood pressure once daily or at least a few times a week, at the same time each day, and keep a log. Our goal is to keep your blood pressure under 140/90.  Please return in about 3 months, we will make sure your blood pressure remains stable and do your annual physical with fasting lab work.

## 2018-01-02 NOTE — Assessment & Plan Note (Addendum)
BP appears stable on bystolic Continue current dosage Continue to monitor RTC in 3 months for F/U - nebivolol (BYSTOLIC) 5 MG tablet; Take 1 tablet (5 mg total) by mouth daily.  Dispense: 90 tablet; Refill: 1

## 2018-01-02 NOTE — Progress Notes (Signed)
Name: Jacob [name]   MRN: [id number]    DOB: [birth year]   Date:01/02/2018       Progress Note  Subjective  Chief Complaint  Chief Complaint  Patient presents with  . Follow-up    HPI Jacob [name] is here today for follow up of HTN.  Hypertension -maintained on bystolic 5 daily, which was started around the end of May. After consultation with his pulmonologist and pharmacist about blood pressure medication in May it was decided that bystolic might the best option for treatment of his HTN considering his other daily medications and chronic conditions. Reports he has been taking the bystolic daily  Without noted adverse medication effects. Reports he has been checking his BP occasionally, recent readings 130s/80s Denies vision changes, chest pain, shortness of breath, edema. Overall feels well today.  BP Readings from Last 3 Encounters:  01/02/18 134/82  12/30/17 122/70  11/27/17 128/88    Patient Active Problem List   Diagnosis Date Noted  . Acute bursitis of right shoulder 09/04/2017  . Nonallopathic lesion of thoracic region 07/31/2017  . Nonallopathic lesion of sacral region 07/31/2017  . Nonallopathic lesion of lumbosacral region 07/31/2017  . Hypertension 07/11/2017  . Chronic bilateral low back pain without sciatica 06/06/2017  . Intermittent explosive disorder in adult   . Bipolar II disorder (HCC) 08/07/2016  . Bipolar 2 disorder (HCC) 08/07/2016  . Mood disorder (HCC) 04/30/2016  . Moderate episode of recurrent major depressive disorder (HCC) 04/29/2016  . Asthma 04/09/2016  . Allergic rhinitis 04/09/2016  . Routine general medical examination at a health care facility 10/26/2014  . Bipolar 1 disorder, mixed, moderate (HCC) 05/05/2014  . ADHD (attention deficit hyperactivity disorder) 05/05/2014    History reviewed. No pertinent surgical history.  Family History  Problem Relation Age of Onset  . Cancer Mother     Social History   Socioeconomic History  .  Marital status: Married    Spouse name: Not on file  . Number of children: 0  . Years of education: 59  . Highest education level: Not on file  Occupational History  . Occupation: Asst Event organiser: [employer]  Social Needs  . Financial resource strain: Not on file  . Food insecurity:    Worry: Not on file    Inability: Not on file  . Transportation needs:    Medical: Not on file    Non-medical: Not on file  Tobacco Use  . Smoking status: Former Smoker    Packs/day: 0.50    Years: 9.00    Pack years: 4.50    Types: Cigarettes    Last attempt to quit: 12/2010    Years since quitting: 7.0  . Smokeless tobacco: Never Used  Substance and Sexual Activity  . Alcohol use: No    Comment: Rare  . Drug use: No  . Sexual activity: Yes    Partners: Female    Birth control/protection: Condom, Diaphragm  Lifestyle  . Physical activity:    Days per week: Not on file    Minutes per session: Not on file  . Stress: Not on file  Relationships  . Social connections:    Talks on phone: Not on file    Gets together: Not on file    Attends religious service: Not on file    Active member of club or organization: Not on file    Attends meetings of clubs or organizations: Not on file    Relationship status: Not on  file  . Intimate partner violence:    Fear of current or ex partner: Not on file    Emotionally abused: Not on file    Physically abused: Not on file    Forced sexual activity: Not on file  Other Topics Concern  . Not on file  Social History Narrative   Born [place] raised in [place]. Currently live in private residence with wife [name]. 3 cats, 2 dogs.   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      Current Outpatient Medications:  .  albuterol (PROVENTIL HFA;VENTOLIN HFA) 108 (90 Base) MCG/ACT inhaler, Inhale 2 puffs into the lungs every 6 (six) hours as needed for wheezing or shortness of breath., Disp: 1  Inhaler, Rfl: 2 .  b complex vitamins tablet, Take 1 tablet by mouth daily., Disp: , Rfl:  .  BYSTOLIC 5 MG tablet, TAKE 1 TABLET BY MOUTH EVERY DAY, Disp: 30 tablet, Rfl: 1 .  carbamazepine (TEGRETOL) 200 MG tablet, Take 1 tablet (200 mg total) by mouth 2 (two) times daily., Disp: 180 tablet, Rfl: 0 .  chlorproMAZINE (THORAZINE) 50 MG tablet, Take 1 tablet (50 mg total) by mouth daily as needed (for agitation)., Disp: 90 tablet, Rfl: 1 .  Diclofenac Sodium (PENNSAID) 2 % SOLN, Place 2 g onto the skin 2 (two) times daily., Disp: 112 g, Rfl: 3 .  fluticasone furoate-vilanterol (BREO ELLIPTA) 200-25 MCG/INH AEPB, Inhale 1 puff into the lungs daily., Disp: 2 each, Rfl: 0 .  GuanFACINE HCl 3 MG TB24, Take 1 tablet (3 mg total) by mouth 2 (two) times daily. 6 mg total daily, Disp: 180 tablet, Rfl: 1 .  lisdexamfetamine (VYVANSE) 70 MG capsule, Take 1 capsule (70 mg total) by mouth daily., Disp: 90 capsule, Rfl: 0 .  lithium carbonate (ESKALITH) 450 MG CR tablet, Take 1 tablet (450 mg total) by mouth 2 (two) times daily., Disp: 180 tablet, Rfl: 1 .  tizanidine (ZANAFLEX) 2 MG capsule, TAKE 1 CAPSULE BY MOUTH THREE TIMES A DAY, Disp: 90 capsule, Rfl: 0  Allergies  Allergen Reactions  . Amlodipine Other (See Comments)    Head felt foggy     ROS See HPI  Objective  Vitals:   01/02/18 1048  BP: 134/82  Pulse: 79  SpO2: 99%  Weight: 209 lb (94.8 kg)  Height: 5\' 7"  (1.702 m)    Body mass index is 32.73 kg/m.  Physical Exam Vital signs reviewed. Constitutional: Patient appears well-developed and well-nourished. No distress.  HENT: Head: Normocephalic and atraumatic. Nose: Nose normal. Mouth/Throat: Oropharynx is clear and moist. No oropharyngeal exudate.  Eyes: Conjunctivaeare normal.Pupils equal, round and reactive to light. No scleral icterus.  Neck: Normal range of motion. Neck supple. Cardiovascular: Normal rate, regular rhythm and normal heart sounds. No BLE edema. Distal pulses  intact. Pulmonary/Chest: Effort normal and breath sounds normal. No respiratory distress. Neurological:He is alert and oriented to person, place, and time. Coordination, balance, strength, speech and gait are normal.  Skin: Skin is warm and dry. No rash noted. No erythema.  Psychiatric: Patient has a normal mood and affect. Judgment and thought content normal.  Assessment & Plan RTC in 3 months for CPE and F/U: HTN- recheck BP He was scheduled for CPE today but has had extensive lab work recently, will have him return in 3 months to ensure BP remains stable and we will do his CPE with annual labs then

## 2018-01-23 NOTE — Progress Notes (Signed)
[name] [address] [place], [place] [address] Phone: [phone] Subjective:     CC: Back pain follow-up  [id number]  Jacob [name] is a 32 y.o. male coming in with complaint of back pain. He worked in the yard last night and is able to move today without pain which is an improvement. States that he feels like he has had decreased pain over time with OMT.      Past Medical History:  Diagnosis Date  . ADHD (attention deficit hyperactivity disorder) evaluation   . Allergy   . Bipolar disorder (HCC)    No past surgical history on file. Social History   Socioeconomic History  . Marital status: Married    Spouse name: Not on file  . Number of children: 0  . Years of education: 38  . Highest education level: Not on file  Occupational History  . Occupation: Asst Event organiser: [employer]  Social Needs  . Financial resource strain: Not on file  . Food insecurity:    Worry: Not on file    Inability: Not on file  . Transportation needs:    Medical: Not on file    Non-medical: Not on file  Tobacco Use  . Smoking status: Former Smoker    Packs/day: 0.50    Years: 9.00    Pack years: 4.50    Types: Cigarettes    Last attempt to quit: 12/2010    Years since quitting: 7.1  . Smokeless tobacco: Never Used  Substance and Sexual Activity  . Alcohol use: No    Comment: Rare  . Drug use: No  . Sexual activity: Yes    Partners: Female    Birth control/protection: Condom, Diaphragm  Lifestyle  . Physical activity:    Days per week: Not on file    Minutes per session: Not on file  . Stress: Not on file  Relationships  . Social connections:    Talks on phone: Not on file    Gets together: Not on file    Attends religious service: Not on file    Active member of club or organization: Not on file    Attends meetings of clubs or organizations: Not on file    Relationship status: Not on file  Other Topics Concern  . Not on  file  Social History Narrative   Born and raised in [place]. Currently live in private residence with wife [name]). 3 cats, 2 dogs.   Fun: Engineer, petroleum   Denies religious beliefs that would effect healthcare.    Allergies  Allergen Reactions  . Amlodipine Other (See Comments)    Head felt foggy   Family History  Problem Relation Age of Onset  . Cancer Mother      Past medical history, social, surgical and family history all reviewed in electronic medical record.  No pertanent information unless stated regarding to the chief complaint.   Review of Systems:Review of systems updated and as accurate as of 01/26/18  No headache, visual changes, nausea, vomiting, diarrhea, constipation, dizziness, abdominal pain, skin rash, fevers, chills, night sweats, weight loss, swollen lymph nodes, body aches, joint swelling,  chest pain, shortness of breath, mood changes.  Positive muscle aches  Objective  Blood pressure 122/82, pulse 83, height 5\' 7"  (1.702 m), weight 213 lb (96.6 kg), SpO2 98 %. Systems examined below as of 01/26/18   General: No apparent distress alert and oriented x3 mood and affect  normal, dressed appropriately.  HEENT: Pupils equal, extraocular movements intact  Respiratory: Patient's speak in full sentences and does not appear short of breath  Cardiovascular: No lower extremity edema, non tender, no erythema  Skin: Warm dry intact with no signs of infection or rash on extremities or on axial skeleton.  Abdomen: Soft nontender  Neuro: Cranial nerves II through XII are intact, neurovascularly intact in all extremities with 2+ DTRs and 2+ pulses.  Lymph: No lymphadenopathy of posterior or anterior cervical chain or axillae bilaterally.  Gait normal with good balance and coordination.  MSK:  Non tender with full range of motion and good stability and symmetric strength and tone of shoulders, elbows, wrist, hip and ankles bilaterally.  Back Exam:  Inspection:  Loss of lordosis Motion: Flexion 40 deg, Extension 25 deg, Side Bending to 35 deg bilaterally,  Rotation to 35 deg bilaterally  SLR laying: Negative  XSLR laying: Negative  Palpable tenderness: Tender to palpation the paraspinal musculature.  Mild tightness FABER: Positive Faber. Sensory change: Gross sensation intact to all lumbar and sacral dermatomes.  Reflexes: 2+ at both patellar tendons, 2+ at achilles tendons, Babinski's downgoing.  Strength at foot  Plantar-flexion: 5/5 Dorsi-flexion: 5/5 Eversion: 5/5 Inversion: 5/5  Leg strength  Quad: 5/5 Hamstring: 5/5 Hip flexor: 5/5 Hip abductors: 4/5 but symmetric Gait unremarkable.  Osteopathic findings C2 flexed rotated and side bent left  C3 flexed rotated and side bent right  C6 flexed rotated and side bent left T9 extended rotated and side bent left L2 flexed rotated and side bent right Sacrum right on right     Impression and Recommendations:     This case required medical decision making of moderate complexity.      Note: This dictation was prepared with Dragon dictation along with smaller phrase technology. Any transcriptional errors that result from this process are unintentional.

## 2018-01-26 ENCOUNTER — Ambulatory Visit: Payer: BLUE CROSS/BLUE SHIELD | Admitting: Family Medicine

## 2018-01-26 ENCOUNTER — Encounter: Payer: Self-pay | Admitting: Family Medicine

## 2018-01-26 VITALS — BP 122/82 | HR 83 | Ht 67.0 in | Wt 213.0 lb

## 2018-01-26 DIAGNOSIS — M999 Biomechanical lesion, unspecified: Secondary | ICD-10-CM | POA: Diagnosis not present

## 2018-01-26 DIAGNOSIS — G8929 Other chronic pain: Secondary | ICD-10-CM | POA: Diagnosis not present

## 2018-01-26 DIAGNOSIS — M545 Low back pain: Secondary | ICD-10-CM | POA: Diagnosis not present

## 2018-01-26 NOTE — Assessment & Plan Note (Signed)
We discussed posture and ergonomics.  We discussed loss of lordosis.  We discussed home exercises and icing regimen.  We discussed avoiding certain activities.  Patient will follow-up with me again in 12 weeks

## 2018-01-26 NOTE — Patient Instructions (Signed)
Good to see you  Jacob [name] is your friend.  Stay active  Possibly try to increase the level when sitting so not flexing forward so much  See me again in 10-12 weeks

## 2018-01-26 NOTE — Assessment & Plan Note (Signed)
Decision today to treat with OMT was based on Physical Exam  After verbal consent patient was treated with HVLA, ME, FPR techniques in  thoracic, lumbar and sacral areas  Patient tolerated the procedure well with improvement in symptoms  Patient given exercises, stretches and lifestyle modifications  See medications in patient instructions if given  Patient will follow up in 12 weeks 

## 2018-04-05 NOTE — Progress Notes (Signed)
[name] [address] [place], [place] [address] Phone: [phone] Subjective:    I Jacob [name] am serving as a Neurosurgeon for Dr. Antoine Primas.   I'm seeing this patient by the request  of:    CC: Low back pain follow-up  [id number]  Jacob [name] is a 32 y.o. male coming in with complaint of back pain. States that last week was a bad week but he is doing better today. Pain comes and goes. Nothing severe.  Patient has noticed from time to time he can pop himself except for the lower back.    Past Medical History:  Diagnosis Date  . ADHD (attention deficit hyperactivity disorder) evaluation   . Allergy   . Bipolar disorder (HCC)    No past surgical history on file. Social History   Socioeconomic History  . Marital status: Married    Spouse name: Not on file  . Number of children: 0  . Years of education: 70  . Highest education level: Not on file  Occupational History  . Occupation: Asst Event organiser: [employer]  Social Needs  . Financial resource strain: Not on file  . Food insecurity:    Worry: Not on file    Inability: Not on file  . Transportation needs:    Medical: Not on file    Non-medical: Not on file  Tobacco Use  . Smoking status: Former Smoker    Packs/day: 0.50    Years: 9.00    Pack years: 4.50    Types: Cigarettes    Last attempt to quit: 12/2010    Years since quitting: 7.3  . Smokeless tobacco: Never Used  Substance and Sexual Activity  . Alcohol use: No    Comment: Rare  . Drug use: No  . Sexual activity: Yes    Partners: Female    Birth control/protection: Condom, Diaphragm  Lifestyle  . Physical activity:    Days per week: Not on file    Minutes per session: Not on file  . Stress: Not on file  Relationships  . Social connections:    Talks on phone: Not on file    Gets together: Not on file    Attends religious service: Not on file    Active member of club or organization: Not on  file    Attends meetings of clubs or organizations: Not on file    Relationship status: Not on file  Other Topics Concern  . Not on file  Social History Narrative   Born and raised in [place]. Currently live in private residence with wife [name]). 3 cats, 2 dogs.   Fun: Engineer, petroleum   Denies religious beliefs that would effect healthcare.    Allergies  Allergen Reactions  . Amlodipine Other (See Comments)    Head felt foggy   Family History  Problem Relation Age of Onset  . Cancer Mother      Current Outpatient Medications (Cardiovascular):  .  nebivolol (BYSTOLIC) 5 MG tablet, Take 1 tablet (5 mg total) by mouth daily.  Current Outpatient Medications (Respiratory):  .  albuterol (PROVENTIL HFA;VENTOLIN HFA) 108 (90 Base) MCG/ACT inhaler, Inhale 2 puffs into the lungs every 6 (six) hours as needed for wheezing or shortness of breath. .  fluticasone furoate-vilanterol (BREO ELLIPTA) 200-25 MCG/INH AEPB, Inhale 1 puff into the lungs daily.    Current Outpatient Medications (Other):  .  b complex vitamins tablet, Take 1 tablet by  mouth daily. .  carbamazepine (TEGRETOL) 200 MG tablet, Take 1 tablet (200 mg total) by mouth 2 (two) times daily. .  chlorproMAZINE (THORAZINE) 50 MG tablet, Take 1 tablet (50 mg total) by mouth daily as needed (for agitation). .  Diclofenac Sodium (PENNSAID) 2 % SOLN, Place 2 g onto the skin 2 (two) times daily. .  GuanFACINE HCl 3 MG TB24, Take 1 tablet (3 mg total) by mouth 2 (two) times daily. 6 mg total daily .  lisdexamfetamine (VYVANSE) 70 MG capsule, Take 1 capsule (70 mg total) by mouth daily. Marland Kitchen  lithium carbonate (ESKALITH) 450 MG CR tablet, Take 1 tablet (450 mg total) by mouth 2 (two) times daily. .  tizanidine (ZANAFLEX) 2 MG capsule, TAKE 1 CAPSULE BY MOUTH THREE TIMES A DAY    Past medical history, social, surgical and family history all reviewed in electronic medical record.  No pertanent information unless stated  regarding to the chief complaint.   Review of Systems:  No headache, visual changes, nausea, vomiting, diarrhea, constipation, dizziness, abdominal pain, skin rash, fevers, chills, night sweats, weight loss, swollen lymph nodes, body aches, joint swelling,  chest pain, shortness of breath, mood changes.  Positive muscle aches  Objective  Blood pressure 120/62, pulse 82, height 5\' 7"  (1.702 m), weight 209 lb (94.8 kg), SpO2 98 %.   General: No apparent distress alert and oriented x3 mood and affect normal, dressed appropriately.  HEENT: Pupils equal, extraocular movements intact  Respiratory: Patient's speak in full sentences and does not appear short of breath  Cardiovascular: No lower extremity edema, non tender, no erythema  Skin: Warm dry intact with no signs of infection or rash on extremities or on axial skeleton.  Abdomen: Soft nontender  Neuro: Cranial nerves II through XII are intact, neurovascularly intact in all extremities with 2+ DTRs and 2+ pulses.  Lymph: No lymphadenopathy of posterior or anterior cervical chain or axillae bilaterally.  Gait normal with good balance and coordination.  MSK:  Non tender with full range of motion and good stability and symmetric strength and tone of shoulders, elbows, wrist, hip, knee and ankles bilaterally.  Back Exam:  Inspection: Unremarkable  Motion: Flexion 45 deg, Extension 45 deg, Side Bending to 45 deg bilaterally,  Rotation to 45 deg bilaterally  SLR laying: Negative  XSLR laying: Negative  Palpable tenderness: Tender to palpation the paraspinal musculature lumbar spine right greater than left. FABER: Positive Faber. Sensory change: Gross sensation intact to all lumbar and sacral dermatomes.  Reflexes: 2+ at both patellar tendons, 2+ at achilles tendons, Babinski's downgoing.  Strength at foot  Plantar-flexion: 5/5 Dorsi-flexion: 5/5 Eversion: 5/5 Inversion: 5/5  Leg strength  Quad: 5/5 Hamstring: 5/5 Hip flexor: 5/5 Hip abductors:  4/5 but symmetric Gait unremarkable.  Osteopathic findings C4 flexed rotated and side bent right T3 extended rotated and side bent left inhaled third rib T4 extended rotated and side bent right  L2 flexed rotated and side bent right Sacrum right on right     Impression and Recommendations:     This case required medical decision making of moderate complexity. The above documentation has been reviewed and is accurate and complete Judi Saa, DO       Note: This dictation was prepared with Dragon dictation along with smaller phrase technology. Any transcriptional errors that result from this process are unintentional.

## 2018-04-06 ENCOUNTER — Ambulatory Visit: Payer: BLUE CROSS/BLUE SHIELD | Admitting: Family Medicine

## 2018-04-06 ENCOUNTER — Encounter: Payer: Self-pay | Admitting: Family Medicine

## 2018-04-06 VITALS — BP 120/62 | HR 82 | Ht 67.0 in | Wt 209.0 lb

## 2018-04-06 DIAGNOSIS — M545 Low back pain, unspecified: Secondary | ICD-10-CM

## 2018-04-06 DIAGNOSIS — G8929 Other chronic pain: Secondary | ICD-10-CM

## 2018-04-06 DIAGNOSIS — M999 Biomechanical lesion, unspecified: Secondary | ICD-10-CM

## 2018-04-06 NOTE — Assessment & Plan Note (Signed)
Decision today to treat with OMT was based on Physical Exam  After verbal consent patient was treated with HVLA, ME, FPR techniques in cervical, thoracic, rib,  lumbar and sacral areas  Patient tolerated the procedure well with improvement in symptoms  Patient given exercises, stretches and lifestyle modifications  See medications in patient instructions if given  Patient will follow up in 4-8 weeks 

## 2018-04-06 NOTE — Assessment & Plan Note (Signed)
Continues to have tightness.  Discussed ergonomics again.  Discussed icing regimen and home exercises, discussed which activities to doing which wants to avoid.  Discussed icing regimen.  Follow-up again in 4 to 8 weeks

## 2018-04-06 NOTE — Patient Instructions (Signed)
Good to see you  Jacob [name] is your friend Keep it up  See me again in 3 months!

## 2018-04-08 ENCOUNTER — Encounter: Payer: Self-pay | Admitting: Nurse Practitioner

## 2018-04-08 DIAGNOSIS — Z0289 Encounter for other administrative examinations: Secondary | ICD-10-CM

## 2018-06-24 ENCOUNTER — Encounter: Payer: Self-pay | Admitting: Family Medicine

## 2018-06-24 ENCOUNTER — Ambulatory Visit: Payer: BLUE CROSS/BLUE SHIELD | Admitting: Family Medicine

## 2018-06-24 VITALS — BP 138/98 | HR 67 | Ht 67.0 in | Wt 209.0 lb

## 2018-06-24 DIAGNOSIS — M999 Biomechanical lesion, unspecified: Secondary | ICD-10-CM | POA: Diagnosis not present

## 2018-06-24 DIAGNOSIS — M545 Low back pain, unspecified: Secondary | ICD-10-CM

## 2018-06-24 DIAGNOSIS — G8929 Other chronic pain: Secondary | ICD-10-CM

## 2018-06-24 MED ORDER — DICLOFENAC SODIUM 2 % TD SOLN: 2.0000 g | Freq: Two times a day (BID) | TRANSDERMAL | 3 refills | Status: AC

## 2018-06-24 NOTE — Assessment & Plan Note (Signed)
Decision today to treat with OMT was based on Physical Exam  After verbal consent patient was treated with HVLA, ME, FPR techniques in  thoracic, lumbar and sacral areas  Patient tolerated the procedure well with improvement in symptoms  Patient given exercises, stretches and lifestyle modifications  See medications in patient instructions if given  Patient will follow up in 12 weeks 

## 2018-06-24 NOTE — Assessment & Plan Note (Signed)
Continues to be chronic.  We discussed posture, ergonomics, hip abductors, we discussed different lifting mechanics and could be more beneficial.  Topical anti-inflammatories prescribed again.  Follow-up again in 4 to 6 weeks

## 2018-06-24 NOTE — Progress Notes (Signed)
Jacob [name], Jacob [name] Phone: [phone] Subjective:   Jacob [name], am serving as a scribe for Dr. Antoine Primas.   CC: back pain follow up   BJY:[id number]  Jacob [name] is a 32 y.o. male coming in with complaint of lower and upper back pain. States that he was installing an accessory 2 weeks ago at work and felt pain in the left scapula and shoulder. Patient states that pain has seemed to subside but did have tingling that radiated down into his left hand. Patient has had OMT to manage his upper and lower back pain.         Past Medical History:  Diagnosis Date  . ADHD (attention deficit hyperactivity disorder) evaluation   . Allergy   . Bipolar disorder (HCC)    No past surgical history on file. Social History   Socioeconomic History  . Marital status: Married    Spouse name: Not on file  . Number of children: 0  . Years of education: 61  . Highest education level: Not on file  Occupational History  . Occupation: Asst Event organiser: [employer]  Social Needs  . Financial resource strain: Not on file  . Food insecurity:    Worry: Not on file    Inability: Not on file  . Transportation needs:    Medical: Not on file    Non-medical: Not on file  Tobacco Use  . Smoking status: Former Smoker    Packs/day: 0.50    Years: 9.00    Pack years: 4.50    Types: Cigarettes    Last attempt to quit: 12/2010    Years since quitting: 7.5  . Smokeless tobacco: Never Used  Substance and Sexual Activity  . Alcohol use: No    Comment: Rare  . Drug use: No  . Sexual activity: Yes    Partners: Female    Birth control/protection: Condom, Diaphragm  Lifestyle  . Physical activity:    Days per week: Not on file    Minutes per session: Not on file  . Stress: Not on file  Relationships  . Social connections:    Talks on phone: Not on file    Gets together: Not on file    Attends religious service:  Not on file    Active member of club or organization: Not on file    Attends meetings of clubs or organizations: Not on file    Relationship status: Not on file  Other Topics Concern  . Not on file  Social History Narrative   Born and raised in [place]. Currently live in private residence with wife [name]). 3 cats, 2 dogs.   Fun: Engineer, petroleum   Denies religious beliefs that would effect healthcare.    Allergies  Allergen Reactions  . Amlodipine Other (See Comments)    Head felt foggy   Family History  Problem Relation Age of Onset  . Cancer Mother      Current Outpatient Medications (Cardiovascular):  .  nebivolol (BYSTOLIC) 5 MG tablet, Take 1 tablet (5 mg total) by mouth daily.  Current Outpatient Medications (Respiratory):  .  albuterol (PROVENTIL HFA;VENTOLIN HFA) 108 (90 Base) MCG/ACT inhaler, Inhale 2 puffs into the lungs every 6 (six) hours as needed for wheezing or shortness of breath. .  fluticasone furoate-vilanterol (BREO ELLIPTA) 200-25 MCG/INH AEPB, Inhale 1 puff into the lungs daily.    Current Outpatient Medications (Other):  .  b complex vitamins tablet, Take 1 tablet by mouth daily. .  chlorproMAZINE (THORAZINE) 50 MG tablet, Take 1 tablet (50 mg total) by mouth daily as needed (for agitation). .  Diclofenac Sodium (PENNSAID) 2 % SOLN, Place 2 g onto the skin 2 (two) times daily. .  GuanFACINE HCl 3 MG TB24, Take 1 tablet (3 mg total) by mouth 2 (two) times daily. 6 mg total daily .  lisdexamfetamine (VYVANSE) 70 MG capsule, Take 1 capsule (70 mg total) by mouth daily. Marland Kitchen.  lithium carbonate (ESKALITH) 450 MG CR tablet, Take 1 tablet (450 mg total) by mouth 2 (two) times daily. .  tizanidine (ZANAFLEX) 2 MG capsule, TAKE 1 CAPSULE BY MOUTH THREE TIMES A DAY .  Diclofenac Sodium 2 % SOLN, Place 2 g onto the skin 2 (two) times daily.    Past medical history, social, surgical and family history all reviewed in electronic medical record.  No  pertanent information unless stated regarding to the chief complaint.   Review of Systems:  No headache, visual changes, nausea, vomiting, diarrhea, constipation, dizziness, abdominal pain, skin rash, fevers, chills, night sweats, weight loss, swollen lymph nodes, body aches, joint swelling, muscle aches, chest pain, shortness of breath, mood changes.   Objective  Blood pressure (!) 138/98, pulse 67, height 5\' 7"  (1.702 m), weight 209 lb (94.8 kg), SpO2 99 %.    General: No apparent distress alert and oriented x3 mood and affect normal, dressed appropriately.  HEENT: Pupils equal, extraocular movements intact  Respiratory: Patient's speak in full sentences and does not appear short of breath  Cardiovascular: No lower extremity edema, non tender, no erythema  Skin: Warm dry intact with no signs of infection or rash on extremities or on axial skeleton.  Abdomen: Soft nontender  Neuro: Cranial nerves II through XII are intact, neurovascularly intact in all extremities with 2+ DTRs and 2+ pulses.  Lymph: No lymphadenopathy of posterior or anterior cervical chain or axillae bilaterally.  Gait normal with good balance and coordination.  MSK:  Non tender with full range of motion and good stability and symmetric strength and tone of shoulders, elbows, wrist, hip, knee and ankles bilaterally.  Back Exam:  Inspection: Unremarkable  Motion: Flexion 45 deg, Extension 30 deg, Side Bending to 45 deg bilaterally,  Rotation to 35 deg bilaterally  SLR laying: Negative  XSLR laying: Negative  Palpable tenderness: Moderate tenderness in the parascapular region on the left side.  Mild pain over the right sacroiliac joint. FABER: Tightness on the right. Sensory change: Gross sensation intact to all lumbar and sacral dermatomes.  Reflexes: 2+ at both patellar tendons, 2+ at achilles tendons, Babinski's downgoing.  Strength at foot  Plantar-flexion: 5/5 Dorsi-flexion: 5/5 Eversion: 5/5 Inversion: 5/5  Leg  strength  Quad: 5/5 Hamstring: 5/5 Hip flexor: 5/5 Hip abductors: 5/5  Gait unremarkable.  Osteopathic findings  T7 extended rotated and side bent left L5 flexed rotated and side bent left  Sacrum right on right    Impression and Recommendations:     This case required medical decision making of moderate complexity. The above documentation has been reviewed and is accurate and complete Judi SaaZachary M Araiyah Cumpton, DO       Note: This dictation was prepared with Dragon dictation along with smaller phrase technology. Any transcriptional errors that result from this process are unintentional.

## 2018-06-24 NOTE — Patient Instructions (Signed)
Good to see you  Overall not bad pennsaid pinkie amount topically 2 times daily as needed.  Ice 20 minutes 2 times daily. Usually after activity and before bed. Keep up with the exercises  See me again in 6 weeks just in case.  Happy holidays!

## 2018-07-11 DIAGNOSIS — J019 Acute sinusitis, unspecified: Secondary | ICD-10-CM | POA: Diagnosis not present

## 2018-07-11 DIAGNOSIS — M549 Dorsalgia, unspecified: Secondary | ICD-10-CM | POA: Diagnosis not present

## 2018-07-12 ENCOUNTER — Other Ambulatory Visit: Payer: Self-pay | Admitting: Nurse Practitioner

## 2018-07-12 DIAGNOSIS — I10 Essential (primary) hypertension: Secondary | ICD-10-CM

## 2018-07-15 ENCOUNTER — Encounter: Payer: Self-pay | Admitting: Nurse Practitioner

## 2018-07-30 ENCOUNTER — Encounter: Payer: Self-pay | Admitting: Nurse Practitioner

## 2018-07-30 ENCOUNTER — Ambulatory Visit: Payer: Self-pay | Admitting: Nurse Practitioner

## 2018-07-30 ENCOUNTER — Ambulatory Visit (INDEPENDENT_AMBULATORY_CARE_PROVIDER_SITE_OTHER)
Admission: RE | Admit: 2018-07-30 | Discharge: 2018-07-30 | Disposition: A | Discharge status: Home / Self Care | Payer: BLUE CROSS/BLUE SHIELD | Source: Ambulatory Visit | Attending: Nurse Practitioner | Admitting: Nurse Practitioner

## 2018-07-30 ENCOUNTER — Other Ambulatory Visit (INDEPENDENT_AMBULATORY_CARE_PROVIDER_SITE_OTHER): Payer: BLUE CROSS/BLUE SHIELD

## 2018-07-30 VITALS — BP 130/76 | HR 108 | Temp 99.2°F | Ht 67.0 in | Wt 189.0 lb

## 2018-07-30 DIAGNOSIS — G8929 Other chronic pain: Secondary | ICD-10-CM | POA: Diagnosis not present

## 2018-07-30 DIAGNOSIS — M5441 Lumbago with sciatica, right side: Secondary | ICD-10-CM

## 2018-07-30 DIAGNOSIS — R05 Cough: Secondary | ICD-10-CM

## 2018-07-30 DIAGNOSIS — R059 Cough, unspecified: Secondary | ICD-10-CM

## 2018-07-30 DIAGNOSIS — M545 Low back pain: Secondary | ICD-10-CM | POA: Diagnosis not present

## 2018-07-30 LAB — URINALYSIS, ROUTINE W REFLEX MICROSCOPIC
Bilirubin Urine: NEGATIVE
HGB URINE DIPSTICK: NEGATIVE
Ketones, ur: NEGATIVE
Leukocytes, UA: NEGATIVE
Nitrite: NEGATIVE
RBC / HPF: NONE SEEN (ref 0–?)
Specific Gravity, Urine: 1.01 (ref 1.000–1.030)
TOTAL PROTEIN, URINE-UPE24: NEGATIVE
Urine Glucose: NEGATIVE
Urobilinogen, UA: 0.2 (ref 0.0–1.0)
pH: 7 (ref 5.0–8.0)

## 2018-07-30 MED ORDER — METHYLPREDNISOLONE 4 MG PO TBPK: ORAL_TABLET | ORAL | 0 refills | Status: DC

## 2018-07-30 MED ORDER — METHYLPREDNISOLONE ACETATE 40 MG/ML IJ SUSP
40.0000 mg | Freq: Once | INTRAMUSCULAR | Status: AC
  Administered 2018-07-30: 40 mg via INTRAMUSCULAR

## 2018-07-30 NOTE — Patient Instructions (Signed)
Head downstairs for labs/urine testing  Take medrol course as prescribed  Keep planned follow up with Dr Katrinka Blazing  I will let you know when I get your tests back   Back Exercises If you have pain in your back, do these exercises 2-3 times each day or as told by your doctor. When the pain goes away, do the exercises once each day, but repeat the steps more times for each exercise (do more repetitions). If you do not have pain in your back, do these exercises once each day or as told by your doctor. Exercises Single Knee to Chest Do these steps 3-5 times in a row for each leg: 1. Lie on your back on a firm bed or the floor with your legs stretched out. 2. Bring one knee to your chest. 3. Hold your knee to your chest by grabbing your knee or thigh. 4. Pull on your knee until you feel a gentle stretch in your lower back. 5. Keep doing the stretch for 10-30 seconds. 6. Slowly let go of your leg and straighten it. Pelvic Tilt Do these steps 5-10 times in a row: 1. Lie on your back on a firm bed or the floor with your legs stretched out. 2. Bend your knees so they point up to the ceiling. Your feet should be flat on the floor. 3. Tighten your lower belly (abdomen) muscles to press your lower back against the floor. This will make your tailbone point up to the ceiling instead of pointing down to your feet or the floor. 4. Stay in this position for 5-10 seconds while you gently tighten your muscles and breathe evenly. Cat-Cow Do these steps until your lower back bends more easily: 1. Get on your hands and knees on a firm surface. Keep your hands under your shoulders, and keep your knees under your hips. You may put padding under your knees. 2. Let your head hang down, and make your tailbone point down to the floor so your lower back is round like the back of a cat. 3. Stay in this position for 5 seconds. 4. Slowly lift your head and make your tailbone point up to the ceiling so your back hangs low  (sags) like the back of a cow. 5. Stay in this position for 5 seconds.  Press-Ups Do these steps 5-10 times in a row: 1. Lie on your belly (face-down) on the floor. 2. Place your hands near your head, about shoulder-width apart. 3. While you keep your back relaxed and keep your hips on the floor, slowly straighten your arms to raise the top half of your body and lift your shoulders. Do not use your back muscles. To make yourself more comfortable, you may change where you place your hands. 4. Stay in this position for 5 seconds. 5. Slowly return to lying flat on the floor.  Bridges Do these steps 10 times in a row: 1. Lie on your back on a firm surface. 2. Bend your knees so they point up to the ceiling. Your feet should be flat on the floor. 3. Tighten your butt muscles and lift your butt off of the floor until your waist is almost as high as your knees. If you do not feel the muscles working in your butt and the back of your thighs, slide your feet 1-2 inches farther away from your butt. 4. Stay in this position for 3-5 seconds. 5. Slowly lower your butt to the floor, and let your butt muscles relax.  If this exercise is too easy, try doing it with your arms crossed over your chest. Belly Crunches Do these steps 5-10 times in a row: 1. Lie on your back on a firm bed or the floor with your legs stretched out. 2. Bend your knees so they point up to the ceiling. Your feet should be flat on the floor. 3. Cross your arms over your chest. 4. Tip your chin a little bit toward your chest but do not bend your neck. 5. Tighten your belly muscles and slowly raise your chest just enough to lift your shoulder blades a tiny bit off of the floor. 6. Slowly lower your chest and your head to the floor. Back Lifts Do these steps 5-10 times in a row: 1. Lie on your belly (face-down) with your arms at your sides, and rest your forehead on the floor. 2. Tighten the muscles in your legs and your  butt. 3. Slowly lift your chest off of the floor while you keep your hips on the floor. Keep the back of your head in line with the curve in your back. Look at the floor while you do this. 4. Stay in this position for 3-5 seconds. 5. Slowly lower your chest and your face to the floor. Contact a doctor if:  Your back pain gets a lot worse when you do an exercise.  Your back pain does not lessen 2 hours after you exercise. If you have any of these problems, stop doing the exercises. Do not do them again unless your doctor says it is okay. Get help right away if:  You have sudden, very bad back pain. If this happens, stop doing the exercises. Do not do them again unless your doctor says it is okay. This information is not intended to replace advice given to you by your health care provider. Make sure you discuss any questions you have with your health care provider. Document Released: 07/27/2010 Document Revised: 03/18/2018 Document Reviewed: 08/18/2014 Elsevier Interactive Patient Education  [employer].

## 2018-07-30 NOTE — Progress Notes (Signed)
Jacob [name] is a 33 y.o. male with the following history as recorded in [facility]:  Patient Active Problem List   Diagnosis Date Noted  . Acute bursitis of right shoulder 09/04/2017  . Nonallopathic lesion of thoracic region 07/31/2017  . Nonallopathic lesion of sacral region 07/31/2017  . Nonallopathic lesion of lumbosacral region 07/31/2017  . Hypertension 07/11/2017  . Chronic bilateral low back pain without sciatica 06/06/2017  . Intermittent explosive disorder in adult   . Bipolar II disorder (HCC) 08/07/2016  . Bipolar 2 disorder (HCC) 08/07/2016  . Mood disorder (HCC) 04/30/2016  . Moderate episode of recurrent major depressive disorder (HCC) 04/29/2016  . Asthma 04/09/2016  . Allergic rhinitis 04/09/2016  . Routine general medical examination at a health care facility 10/26/2014  . Bipolar 1 disorder, mixed, moderate (HCC) 05/05/2014  . ADHD (attention deficit hyperactivity disorder) 05/05/2014    Current Outpatient Medications  Medication Sig Dispense Refill  . albuterol (PROVENTIL HFA;VENTOLIN HFA) 108 (90 Base) MCG/ACT inhaler Inhale 2 puffs into the lungs every 6 (six) hours as needed for wheezing or shortness of breath. 1 Inhaler 2  . b complex vitamins tablet Take 1 tablet by mouth daily.    Marland Kitchen BYSTOLIC 5 MG tablet TAKE 1 TABLET BY MOUTH EVERY DAY 90 tablet 0  . chlorproMAZINE (THORAZINE) 50 MG tablet Take 1 tablet (50 mg total) by mouth daily as needed (for agitation). 90 tablet 1  . Diclofenac Sodium (PENNSAID) 2 % SOLN Place 2 g onto the skin 2 (two) times daily. 112 g 3  . Diclofenac Sodium 2 % SOLN Place 2 g onto the skin 2 (two) times daily. 112 g 3  . fluticasone furoate-vilanterol (BREO ELLIPTA) 200-25 MCG/INH AEPB Inhale 1 puff into the lungs daily. 2 each 0  . GuanFACINE HCl 3 MG TB24 Take 1 tablet (3 mg total) by mouth 2 (two) times daily. 6 mg total daily 180 tablet 1  . lisdexamfetamine (VYVANSE) 70 MG capsule Take 1 capsule (70 mg total) by mouth daily. 90  capsule 0  . lithium carbonate (ESKALITH) 450 MG CR tablet Take 1 tablet (450 mg total) by mouth 2 (two) times daily. 180 tablet 1  . tizanidine (ZANAFLEX) 2 MG capsule TAKE 1 CAPSULE BY MOUTH THREE TIMES A DAY 90 capsule 0  . methylPREDNISolone (MEDROL) 4 MG TBPK tablet 6 tabs today, 5 tabs day 2, 4 tabs day 3, 3 tabs day 4, 2 tabs day 5, 1 tab day 6. Take w/food. 21 tablet 0   No current facility-administered medications for this visit.     Allergies: Amlodipine  Past Medical History:  Diagnosis Date  . ADHD (attention deficit hyperactivity disorder) evaluation   . Allergy   . Bipolar disorder (HCC)     History reviewed. No pertinent surgical history.  Family History  Problem Relation Age of Onset  . Cancer Mother     Social History   Tobacco Use  . Smoking status: Former Smoker    Packs/day: 0.50    Years: 9.00    Pack years: 4.50    Types: Cigarettes    Last attempt to quit: 12/2010    Years since quitting: 7.6  . Smokeless tobacco: Never Used  Substance Use Topics  . Alcohol use: No    Comment: Rare     Subjective:  Mr Gottschall is here today requesting evaluation of acute complaints of back pain, cough/cold symptoms. He is currently following with sports medicine for back pain, with next follow up  scheduled for 08/06/2018. He tells me over past 2 weeks his back pain is worse, now radiating into his left leg and feels his left leg is going to "give out." he has tried flexeril without relief.  Denies any recent injuries, falls or heavy lifting. He also c/o cough/cold symptoms which have persisted for several weeks now, was seen at an urgent care after symptoms started and given cefdinir course, which he completed as prescribed but continues to have low grade fevers, rhinorrhea, sinus and ear pain and pressure, cough with clear sputum. No syncope, confusion, cp, abdominal pain, nausea, vomiting, bowel or bladder changes, edema, rash. He tells me he had a cough like this last  year and steroids helped him feel better  ROS- See HPI   Objective:  Vitals:   07/30/18 1346  BP: 130/76  Pulse: (!) 108  Temp: 99.2 F (37.3 C)  TempSrc: Oral  SpO2: 98%  Weight: 189 lb (85.7 kg)  Height: 5\' 7"  (1.702 m)    General: Well developed, well nourished, in no acute distress  Skin : Warm and dry.  Head: Normocephalic and atraumatic  Eyes: Sclera and conjunctiva clear; pupils round and reactive to light; extraocular movements intact  Ears: External normal; canals clear; tympanic membranes normal  Oropharynx: Pink, supple. No suspicious lesions  Neck: Supple without thyromegaly Lungs: Respirations unlabored; clear to auscultation bilaterally without wheeze, rales, rhonchi  CVS exam: normal rate and regular rhythm, S1 and S2 normal.  Musculoskeletal: No deformities; no active joint inflammation; normal range of motion  Extremities: No edema, cyanosis, clubbing  Vessels: Symmetric bilaterally  Neurologic: Alert and oriented; speech intact; face symmetrical; moves all extremities well; CNII-XII intact without focal deficit  Psychiatric: Normal mood and affect.  Assessment:  1. Cough   2. Chronic bilateral low back pain with right-sided sciatica     Plan:   Cough IM depomedrol in office, medrol Rx sent-medication dosing, side effects discussed Check chest xray  F/U with further recommendations pending results - DG Chest 2 View; Future - methylPREDNISolone (MEDROL) 4 MG TBPK tablet; 6 tabs today, 5 tabs day 2, 4 tabs day 3, 3 tabs day 4, 2 tabs day 5, 1 tab day 6. Take w/food.  Dispense: 21 tablet; Refill: 0   Return for cpe.  Orders Placed This Encounter  Procedures  . CULTURE, URINE COMPREHENSIVE    Standing Status:   Future    Number of Occurrences:   1    Standing Expiration Date:   08/30/2018  . DG Chest 2 View    Standing Status:   Future    Standing Expiration Date:   09/28/2019    Order Specific Question:   Reason for Exam (SYMPTOM  OR DIAGNOSIS  REQUIRED)    Answer:   cough    Order Specific Question:   Preferred imaging location?    Answer:   [facility]    Order Specific Question:   Radiology Contrast Protocol - do NOT remove file path    Answer:   [email or web address]  . DG Lumbar Spine Complete    Standing Status:   Future    Standing Expiration Date:   09/28/2019    Order Specific Question:   Reason for Exam (SYMPTOM  OR DIAGNOSIS REQUIRED)    Answer:   back pain, leg weakness    Order Specific Question:   Preferred imaging location?    Answer:   [facility]    Order Specific Question:   Radiology Contrast Protocol -  do NOT remove file path    Answer:   \\[email or web address]  . Urinalysis, Routine w reflex microscopic    Standing Status:   Future    Number of Occurrences:   1    Standing Expiration Date:   07/30/2019    Requested Prescriptions   Signed Prescriptions Disp Refills  . methylPREDNISolone (MEDROL) 4 MG TBPK tablet 21 tablet 0    Sig: 6 tabs today, 5 tabs day 2, 4 tabs day 3, 3 tabs day 4, 2 tabs day 5, 1 tab day 6. Take w/food.

## 2018-07-30 NOTE — Assessment & Plan Note (Signed)
IM depomedrol in office, medrol Rx sent-medication dosing, side effects discussed Check urine for infection, update imaging today Continue with planned sports medicine follow up Home management, back exercises, red flags and return precautions including when to seek immediate care discussed and printed on AVS F/U with further recommendations pending test results - Urinalysis, Routine w reflex microscopic; Future - CULTURE, URINE COMPREHENSIVE; Future - DG Lumbar Spine Complete; Future - methylPREDNISolone (MEDROL) 4 MG TBPK tablet; 6 tabs today, 5 tabs day 2, 4 tabs day 3, 3 tabs day 4, 2 tabs day 5, 1 tab day 6. Take w/food.  Dispense: 21 tablet; Refill: 0 - methylPREDNISolone acetate (DEPO-MEDROL) injection 40 mg

## 2018-07-31 ENCOUNTER — Other Ambulatory Visit: Payer: BLUE CROSS/BLUE SHIELD

## 2018-07-31 DIAGNOSIS — M5441 Lumbago with sciatica, right side: Secondary | ICD-10-CM | POA: Diagnosis not present

## 2018-07-31 DIAGNOSIS — G8929 Other chronic pain: Secondary | ICD-10-CM

## 2018-08-01 LAB — URINE CULTURE
MICRO NUMBER: 101448
Result:: NO GROWTH
SPECIMEN QUALITY:: ADEQUATE

## 2018-08-04 ENCOUNTER — Emergency Department (HOSPITAL_COMMUNITY): Payer: BLUE CROSS/BLUE SHIELD

## 2018-08-04 ENCOUNTER — Encounter (HOSPITAL_COMMUNITY): Payer: Self-pay | Admitting: Obstetrics and Gynecology

## 2018-08-04 ENCOUNTER — Emergency Department (HOSPITAL_COMMUNITY)
Admission: EM | Admit: 2018-08-04 | Discharge: 2018-08-04 | Disposition: A | Discharge status: Home / Self Care | Payer: BLUE CROSS/BLUE SHIELD | Attending: Emergency Medicine | Admitting: Emergency Medicine

## 2018-08-04 ENCOUNTER — Other Ambulatory Visit: Payer: Self-pay

## 2018-08-04 DIAGNOSIS — F17210 Nicotine dependence, cigarettes, uncomplicated: Secondary | ICD-10-CM | POA: Insufficient documentation

## 2018-08-04 DIAGNOSIS — M62838 Other muscle spasm: Secondary | ICD-10-CM | POA: Insufficient documentation

## 2018-08-04 DIAGNOSIS — R2681 Unsteadiness on feet: Secondary | ICD-10-CM | POA: Insufficient documentation

## 2018-08-04 DIAGNOSIS — I10 Essential (primary) hypertension: Secondary | ICD-10-CM | POA: Insufficient documentation

## 2018-08-04 DIAGNOSIS — R202 Paresthesia of skin: Secondary | ICD-10-CM | POA: Diagnosis present

## 2018-08-04 DIAGNOSIS — Z79899 Other long term (current) drug therapy: Secondary | ICD-10-CM | POA: Insufficient documentation

## 2018-08-04 DIAGNOSIS — R4182 Altered mental status, unspecified: Secondary | ICD-10-CM | POA: Insufficient documentation

## 2018-08-04 DIAGNOSIS — J45998 Other asthma: Secondary | ICD-10-CM | POA: Insufficient documentation

## 2018-08-04 DIAGNOSIS — R2689 Other abnormalities of gait and mobility: Secondary | ICD-10-CM

## 2018-08-04 DIAGNOSIS — R410 Disorientation, unspecified: Secondary | ICD-10-CM | POA: Diagnosis not present

## 2018-08-04 LAB — BASIC METABOLIC PANEL
Anion gap: 7 (ref 5–15)
BUN: 11 mg/dL (ref 6–20)
CO2: 27 mmol/L (ref 22–32)
Calcium: 9.4 mg/dL (ref 8.9–10.3)
Chloride: 104 mmol/L (ref 98–111)
Creatinine, Ser: 0.89 mg/dL (ref 0.61–1.24)
GFR calc Af Amer: >60 mL/min (ref >60)
GFR calc non Af Amer: >60 mL/min (ref >60)
Glucose, Bld: 96 mg/dL (ref 70–99)
Potassium: 4 mmol/L (ref 3.5–5.1)
Sodium: 138 mmol/L (ref 135–145)

## 2018-08-04 LAB — LITHIUM LEVEL: Lithium Lvl: 0.49 mmol/L — ABNORMAL LOW (ref 0.60–1.20)

## 2018-08-04 LAB — CBC
HEMATOCRIT: 48.4 % (ref 39.0–52.0)
Hemoglobin: 15.8 g/dL (ref 13.0–17.0)
MCH: 33.1 pg (ref 26.0–34.0)
MCHC: 32.6 g/dL (ref 30.0–36.0)
MCV: 101.5 fL — ABNORMAL HIGH (ref 80.0–100.0)
Platelets: 328 10*3/uL (ref 150–400)
RBC: 4.77 MIL/uL (ref 4.22–5.81)
RDW: 12.8 % (ref 11.5–15.5)
WBC: 15.4 10*3/uL — ABNORMAL HIGH (ref 4.0–10.5)
nRBC: 0 % (ref 0.0–0.2)

## 2018-08-04 MED ORDER — MECLIZINE HCL 25 MG PO TABS
25.0000 mg | ORAL_TABLET | Freq: Once | ORAL | Status: AC
  Administered 2018-08-04: 25 mg via ORAL
  Filled 2018-08-04: qty 1

## 2018-08-04 NOTE — ED Triage Notes (Signed)
Patient reports he takes lithium and has been having numbness, tingling and bouts of confusion. Pt reports he called his PCP and they requested he get his labs drawn and check his lithium levels. Pt reports he has also been having chills and night sweats.

## 2018-08-04 NOTE — ED Provider Notes (Signed)
[facility]-EMERGENCY DEPT Provider Note   CSN: [id number] Arrival date & time: 08/04/18  1102     History   Chief Complaint Chief Complaint  Patient presents with  . Tingling  . Altered Mental Status    HPI Jacob [name] is a 33 y.o. male.  The history is provided by the patient.  Altered Mental Status  Presenting symptoms comment:  Off balance with walking.  Feels like muscles lock up and shoots to the head and down the right leg.  Patient states that 2 weeks ago he had URI symptoms and they were lingering with congestion and cough so on Wednesday or Thursday of last week he had an injection of steroids and had oral steroids that he took home.  He states approximately 4 to 5 days ago or when the current symptoms started.  He states the cough and congestion have pretty much gone away.  He has no shortness of breath but he still feeling chilled.  He has no abdominal pain, nausea or vomiting.  He states when everything locks up he is unable to walk or move his legs or arms.  He states that usually only last 10 to 15 seconds but initially was only happening once or twice a day but in the last 5 hours it has occurred 15 times.  The only thing he can think that makes it come on is when he goes from lying to standing.  He has had no trauma.  He denies any numbness or tingling in his legs.  No recent medication changes except for the addition of the steroids.  He does not use drugs but does smoke cigarettes.  He has a history of low back pain but denies any specific pain currently.  He has had no recent vaccinations.  No visual changes or neck pain. Severity:  Moderate Episode history:  Multiple Duration:  5 days Timing:  Intermittent Progression:  Worsening Chronicity:  New Context: recent illness   Context: not alcohol use, not drug use, not head injury and not homeless   Context comment:  States that he is going through a divorce and has lost 30lbs recently but denies  feeling significantly depressed Associated symptoms: fever   Associated symptoms: no abdominal pain, no agitation, no difficulty breathing, no hallucinations, no headaches, no light-headedness, no nausea, no slurred speech, no suicidal behavior, no visual change, no vomiting and no weakness     Past Medical History:  Diagnosis Date  . ADHD (attention deficit hyperactivity disorder) evaluation   . Allergy   . Bipolar disorder [facility])     Patient Active Problem List   Diagnosis Date Noted  . Acute bursitis of right shoulder 09/04/2017  . Nonallopathic lesion of thoracic region 07/31/2017  . Nonallopathic lesion of sacral region 07/31/2017  . Nonallopathic lesion of lumbosacral region 07/31/2017  . Hypertension 07/11/2017  . Chronic bilateral low back pain with right-sided sciatica 06/06/2017  . Intermittent explosive disorder in adult   . Bipolar II disorder (HCC) 08/07/2016  . Bipolar 2 disorder (HCC) 08/07/2016  . Mood disorder (HCC) 04/30/2016  . Moderate episode of recurrent major depressive disorder (HCC) 04/29/2016  . Asthma 04/09/2016  . Allergic rhinitis 04/09/2016  . Routine general medical examination at a health care facility 10/26/2014  . Bipolar 1 disorder, mixed, moderate (HCC) 05/05/2014  . ADHD (attention deficit hyperactivity disorder) 05/05/2014    History reviewed. No pertinent surgical history.      Home Medications    Prior  to Admission medications   Medication Sig Start Date End Date Taking? Authorizing Provider  albuterol (PROVENTIL HFA;VENTOLIN HFA) 108 (90 Base) MCG/ACT inhaler Inhale 2 puffs into the lungs every 6 (six) hours as needed for wheezing or shortness of breath. 08/07/17   Evaristo Bury, NP  b complex vitamins tablet Take 1 tablet by mouth daily.    [provider]  BYSTOLIC 5 MG tablet TAKE 1 TABLET BY MOUTH EVERY DAY 07/13/18   Evaristo Bury, NP  chlorproMAZINE (THORAZINE) 50 MG tablet Take 1 tablet (50 mg total) by  mouth daily as needed (for agitation). 06/12/17   Eksir, Bo Mcclintock, MD  Diclofenac Sodium (PENNSAID) 2 % SOLN Place 2 g onto the skin 2 (two) times daily. 06/27/17   Judi Saa, DO  Diclofenac Sodium 2 % SOLN Place 2 g onto the skin 2 (two) times daily. 06/24/18   Judi Saa, DO  fluticasone furoate-vilanterol (BREO ELLIPTA) 200-25 MCG/INH AEPB Inhale 1 puff into the lungs daily. 11/27/17   Kalman Shan, MD  GuanFACINE HCl 3 MG TB24 Take 1 tablet (3 mg total) by mouth 2 (two) times daily. 6 mg total daily 11/05/17   Eksir, Bo Mcclintock, MD  lisdexamfetamine (VYVANSE) 70 MG capsule Take 1 capsule (70 mg total) by mouth daily. 12/31/17   Eksir, Bo Mcclintock, MD  lithium carbonate (ESKALITH) 450 MG CR tablet Take 1 tablet (450 mg total) by mouth 2 (two) times daily. 11/05/17   Eksir, Bo Mcclintock, MD  methylPREDNISolone (MEDROL) 4 MG TBPK tablet 6 tabs today, 5 tabs day 2, 4 tabs day 3, 3 tabs day 4, 2 tabs day 5, 1 tab day 6. Take w/food. 07/30/18   Evaristo Bury, NP  tizanidine (ZANAFLEX) 2 MG capsule TAKE 1 CAPSULE BY MOUTH THREE TIMES A DAY 08/13/17   Evaristo Bury, NP    Family History Family History  Problem Relation Age of Onset  . Cancer Mother     Social History Social History   Tobacco Use  . Smoking status: Current Every Day Smoker    Packs/day: 0.50    Years: 9.00    Pack years: 4.50    Types: Cigarettes    Last attempt to quit: 12/07/2010    Years since quitting: 7.6  . Smokeless tobacco: Never Used  Substance Use Topics  . Alcohol use: No    Comment: Rare  . Drug use: No     Allergies   Amlodipine   Review of Systems Review of Systems  Constitutional: Positive for fever.  Gastrointestinal: Negative for abdominal pain, nausea and vomiting.  Neurological: Negative for weakness, light-headedness and headaches.  Psychiatric/Behavioral: Negative for agitation and hallucinations.  All other systems reviewed and are  negative.    Physical Exam Updated Vital Signs BP 133/67   Pulse 72   Temp 99.3 F (37.4 C) (Oral)   Resp 16   Ht 5\' 7"  (1.702 m)   Wt 85.7 kg   SpO2 99%   BMI 29.60 kg/m   Physical Exam Vitals signs and nursing note reviewed.  Constitutional:      General: He is not in acute distress.    Appearance: He is well-developed.  HENT:     Head: Normocephalic and atraumatic.     Right Ear: Tympanic membrane normal.     Left Ear: Tympanic membrane normal.     Nose: Nose normal. No congestion or rhinorrhea.     Mouth/Throat:     Mouth: Mucous membranes  are moist.  Eyes:     Conjunctiva/sclera: Conjunctivae normal.     Pupils: Pupils are equal, round, and reactive to light.  Neck:     Musculoskeletal: Normal range of motion and neck supple.  Cardiovascular:     Rate and Rhythm: Normal rate and regular rhythm.     Heart sounds: No murmur.  Pulmonary:     Effort: Pulmonary effort is normal. No respiratory distress.     Breath sounds: Normal breath sounds. No wheezing or rales.  Abdominal:     General: There is no distension.     Palpations: Abdomen is soft.     Tenderness: There is no abdominal tenderness. There is no guarding or rebound.  Musculoskeletal: Normal range of motion.     Lumbar back: He exhibits tenderness. He exhibits normal range of motion.       Back:  Skin:    General: Skin is warm and dry.     Findings: No erythema or rash.  Neurological:     General: No focal deficit present.     Mental Status: He is alert and oriented to person, place, and time.     Cranial Nerves: Cranial nerves are intact.     Sensory: No sensory deficit.     Motor: No weakness or pronator drift.     Coordination: Coordination normal.     Gait: Gait is intact.     Deep Tendon Reflexes: Babinski sign absent on the right side. Babinski sign absent on the left side.     Reflex Scores:      Patellar reflexes are 3+ on the right side and 1+ on the left side.    Comments: No clonus  noted.  On gait pt has no ataxia but does walk cautiously  Psychiatric:        Mood and Affect: Mood normal.        Behavior: Behavior normal.      ED Treatments / Results  Labs (all labs ordered are listed, but only abnormal results are displayed) Labs Reviewed  CBC - Abnormal; Notable for the following components:      Result Value   WBC 15.4 (*)    MCV 101.5 (*)    All other components within normal limits  LITHIUM LEVEL - Abnormal; Notable for the following components:   Lithium Lvl 0.49 (*)    All other components within normal limits  BASIC METABOLIC PANEL  URINALYSIS, ROUTINE W REFLEX MICROSCOPIC    EKG None  Radiology Ct Head Wo Contrast  Result Date: 08/04/2018 CLINICAL DATA:  Confusion. EXAM: CT HEAD WITHOUT CONTRAST TECHNIQUE: Contiguous axial images were obtained from the base of the skull through the vertex without intravenous contrast. COMPARISON:  No comparison studies available. FINDINGS: Brain: There is no evidence for acute hemorrhage, hydrocephalus, mass lesion, or abnormal extra-axial fluid collection. No definite CT evidence for acute infarction. Vascular: No hyperdense vessel or unexpected calcification. Skull: No evidence for fracture. No worrisome lytic or sclerotic lesion. Sinuses/Orbits: Chronic opacification noted right frontal sinus. Remaining visualized paranasal sinuses and mastoid air cells are clear. Visualized portions of the globes and intraorbital fat are unremarkable. Other: None. IMPRESSION: Chronic opacification right frontal sinus. Otherwise unremarkable exam. Electronically Signed   By: Kennith CenterEric  Mansell M.D.   On: 08/04/2018 17:53    Procedures Procedures (including critical care time)  Medications Ordered in ED Medications  meclizine (ANTIVERT) tablet 25 mg (has no administration in time range)     Initial Impression /  Assessment and Plan / ED Course  I have reviewed the triage vital signs and the nursing notes.  Pertinent labs &  imaging results that were available during my care of the patient were reviewed by me and considered in my medical decision making (see chart for details).     Patient presenting today with vague symptoms of 4 to 5 days of feeling like his body is locking up that lasts for 10 to 15 seconds but is occurring more frequently as well as having some difficulty walking and feeling off balance.  Prior to all this occurring he had URI symptoms and had steroid injection on Thursday of last week for persistent symptoms.  He states that is pretty much cleared up but now the symptoms have started.  On exam he is slightly more hyperreflexive on the right but has no notable weakness or pronator drift.  He has no clonus.  He has noted ataxia but is cautious while walking.  He has no signs concerning for meningitis with full mobility of his neck and no neck pain.  Low suspicion for encephalitis as he has no symptoms of altered mental status. His lithium level today is within normal limits with low suspicion that that is the cause of his symptoms.  CBC does have a leukocytosis of 15,000 however he did recently get steroid injection and has been taking oral steroids.  BMP with normal potassium and renal function.  Patient has full mobility of his back but mild tenderness with palpation of the lumbar spine.  He has had no recent injections or symptoms consistent with [place] or transverse myelitis as there are no notable neurologic deficits at this time.  Lower suspicion for discitis or osteomyelitis of the spine is patient has no risk factors for these he does not use IV drugs has not had any recent back procedures or injections.  Symptoms do sound slightly like peripheral vertigo as his symptoms are worse with walking and he feels off balance.  Low suspicion for stroke.  Head CT to rule out intracranial pressure elevation, shift or mass.  He does report losing 30 pounds recently that he cannot account for but may be  related to him going through a divorce.  Head CT and UA pending.  Denies any urinary symptoms at this time low suspicion for MS or other reasons to do LP.  6:50 PM CT was negative except for chronic opacification of the right frontal sinus.  Patient's exam does not change.  Discussed with him at this time will discharge him home and he should stop taking the steroids.  If symptoms worsen he should return but if they have not gone away he should see his doctor early next week.   Final Clinical Impressions(s) / ED Diagnoses   Final diagnoses:  Balance problems  Muscle spasm    ED Discharge Orders    None       Gwyneth Sprout, MD 08/04/18 [phone]

## 2018-08-04 NOTE — Discharge Instructions (Signed)
Today your lithium level was on the low side.  Normal potassium and kidney function.  Where your white blood cell count was elevated today but is most likely related to the steroids.  You should stop taking the prednisone as it may be causing some of the symptoms.  If your symptoms worsen such as you are unable to move 1 of your extremities having high fevers, vomiting, abdominal pain need to return to the emergency room.

## 2018-08-05 NOTE — Progress Notes (Signed)
Jacob ScaleZach [name] D.O. [facility] [address] [place], [place] [address] Phone: [phone] Subjective:    I Jacob NighKana [name] am serving as a Neurosurgeonscribe for Dr. Antoine PrimasZachary [name].    CC: Back pain and neck pain follow-up  RJJ:[id number]:Subjective  Jacob CharityGraham L [name] is a 33 y.o. male coming in with complaint of back pain. Pain comes and goes.  Patient states was having increasing discomfort previously.  Patient was seen in the emergency department 2 days ago for muscle spasms and balance problems.  Patient had discontinued some of his medications on his own including his lithium.  Recently did have a upper respiratory infection as well.  Patient states that the response to manipulation has been somewhat helpful.  Has restarted the lithium at this moment and is hoping that that will help with some of the anxiety that he thinks is contributing as well.  Patient did have a CT head done while he was in the emergency room.  This was independently visualized by me showing no significant abnormality.  Patient also had x-rays taken July 30, 2018.  These were independently visualized by me as well as are negative.  No change from patient's previous x-rays in 2018.  Past Medical History:  Diagnosis Date  . ADHD (attention deficit hyperactivity disorder) evaluation   . Allergy   . Bipolar disorder (HCC)    No past surgical history on file. Social History   Socioeconomic History  . Marital status: Married    Spouse name: Not on file  . Number of children: 0  . Years of education: 3112  . Highest education level: Not on file  Occupational History  . Occupation: Asst Event organiserManager    Employer: [employer]  Social Needs  . Financial resource strain: Not on file  . Food insecurity:    Worry: Not on file    Inability: Not on file  . Transportation needs:    Medical: Not on file    Non-medical: Not on file  Tobacco Use  . Smoking status: Current Every Day Smoker    Packs/day: 0.50    Years: 9.00    Pack  years: 4.50    Types: Cigarettes    Last attempt to quit: 12/07/2010    Years since quitting: 7.6  . Smokeless tobacco: Never Used  Substance and Sexual Activity  . Alcohol use: No    Comment: Rare  . Drug use: No  . Sexual activity: Yes    Partners: Female    Birth control/protection: Condom, Diaphragm  Lifestyle  . Physical activity:    Days per week: Not on file    Minutes per session: Not on file  . Stress: Not on file  Relationships  . Social connections:    Talks on phone: Not on file    Gets together: Not on file    Attends religious service: Not on file    Active member of club or organization: Not on file    Attends meetings of clubs or organizations: Not on file    Relationship status: Not on file  Other Topics Concern  . Not on file  Social History Narrative   [place] Currently live in private residence with wife Jacob Duster([name]). 3 cats, 2 dogs.   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    Allergies  Allergen Reactions  . Amlodipine Other (See Comments)    Head felt foggy   Family History  Problem Relation  Age of Onset  . Cancer Mother     Current Outpatient Medications (Endocrine & Metabolic):  .  methylPREDNISolone (MEDROL) 4 MG TBPK tablet, 6 tabs today, 5 tabs day 2, 4 tabs day 3, 3 tabs day 4, 2 tabs day 5, 1 tab day 6. Take w/food. (Patient taking differently: Take 4-24 mg by mouth See admin instructions. 6 tabs today, 5 tabs day 2, 4 tabs day 3, 3 tabs day 4, 2 tabs day 5, 1 tab day 6. Take w/food.)  Current Outpatient Medications (Cardiovascular):  .  BYSTOLIC 5 MG tablet, TAKE 1 TABLET BY MOUTH EVERY DAY (Patient taking differently: Take 5 mg by mouth daily. )  Current Outpatient Medications (Respiratory):  .  albuterol (PROVENTIL HFA;VENTOLIN HFA) 108 (90 Base) MCG/ACT inhaler, Inhale 2 puffs into the lungs every 6 (six) hours as needed for wheezing or shortness of breath. .  fluticasone  furoate-vilanterol (BREO ELLIPTA) 200-25 MCG/INH AEPB, Inhale 1 puff into the lungs daily.    Current Outpatient Medications (Other):  .  b complex vitamins tablet, Take 1 tablet by mouth daily. .  chlorproMAZINE (THORAZINE) 50 MG tablet, Take 1 tablet (50 mg total) by mouth daily as needed (for agitation). .  Diclofenac Sodium (PENNSAID) 2 % SOLN, Place 2 g onto the skin 2 (two) times daily. .  Diclofenac Sodium 2 % SOLN, Place 2 g onto the skin 2 (two) times daily. .  GuanFACINE HCl 3 MG TB24, Take 1 tablet (3 mg total) by mouth 2 (two) times daily. 6 mg total daily .  lisdexamfetamine (VYVANSE) 70 MG capsule, Take 1 capsule (70 mg total) by mouth daily. Marland Kitchen  lithium carbonate (ESKALITH) 450 MG CR tablet, Take 1 tablet (450 mg total) by mouth 2 (two) times daily. .  tizanidine (ZANAFLEX) 2 MG capsule, TAKE 1 CAPSULE BY MOUTH THREE TIMES A DAY (Patient taking differently: Take 2 mg by mouth 3 (three) times daily as needed for muscle spasms. ) .  gabapentin (NEURONTIN) 100 MG capsule, Take 2 capsules (200 mg total) by mouth at bedtime.    Past medical history, social, surgical and family history all reviewed in electronic medical record.  No pertanent information unless stated regarding to the chief complaint.   Review of Systems:  No headache, visual changes, nausea, vomiting, diarrhea, constipation, dizziness, abdominal pain, skin rash, fevers, chills, night sweats, weight loss, swollen lymph nodes, chest pain, shortness of breath, mood changes.  Positive muscle aches, body aches  Objective  Blood pressure (!) 146/86, pulse 77, height 5\' 7"  (1.702 m), weight 188 lb (85.3 kg), SpO2 98 %.   General: No apparent distress alert and oriented x3 mood and affect normal, dressed appropriately.  Significantly more anxious than previous exam HEENT: Pupils equal, extraocular movements intact  Respiratory: Patient's speak in full sentences and does not appear short of breath  Cardiovascular: No lower  extremity edema, non tender, no erythema  Skin: Warm dry intact with no signs of infection or rash on extremities or on axial skeleton.  Abdomen: Soft nontender  Neuro: Cranial nerves II through XII are intact, neurovascularly intact in all extremities with 2+ DTRs and 2+ pulses.  Lymph: No lymphadenopathy of posterior or anterior cervical chain or axillae bilaterally.  Gait normal with good balance and coordination.  MSK:  Non tender with full range of motion and good stability and symmetric strength and tone of shoulders, elbows, wrist, hip, knee and ankles bilaterally.  Back exam does have loss of lordosis.  Does have  tightness the [facility] test bilaterally, negative straight leg test.  Some tenderness to palpation in the paraspinal musculature.  Osteopathic findings T5 extended rotated and side bent right inhaled rib L1 flexed rotated and side bent right Sacrum right on right     Impression and Recommendations:     This case required medical decision making of moderate complexity. The above documentation has been reviewed and is accurate and complete Judi SaaZachary M Smith, DO       Note: This dictation was prepared with Dragon dictation along with smaller phrase technology. Any transcriptional errors that result from this process are unintentional.

## 2018-08-06 ENCOUNTER — Ambulatory Visit: Payer: BLUE CROSS/BLUE SHIELD | Admitting: Family Medicine

## 2018-08-06 ENCOUNTER — Encounter: Payer: Self-pay | Admitting: Family Medicine

## 2018-08-06 VITALS — BP 146/86 | HR 77 | Ht 67.0 in | Wt 188.0 lb

## 2018-08-06 DIAGNOSIS — G8929 Other chronic pain: Secondary | ICD-10-CM | POA: Diagnosis not present

## 2018-08-06 DIAGNOSIS — M999 Biomechanical lesion, unspecified: Secondary | ICD-10-CM | POA: Diagnosis not present

## 2018-08-06 DIAGNOSIS — M5441 Lumbago with sciatica, right side: Secondary | ICD-10-CM

## 2018-08-06 DIAGNOSIS — F39 Unspecified mood [affective] disorder: Secondary | ICD-10-CM

## 2018-08-06 MED ORDER — GABAPENTIN 100 MG PO CAPS: 200.0000 mg | ORAL_CAPSULE | Freq: Every day | ORAL | 3 refills | Status: DC

## 2018-08-06 NOTE — Assessment & Plan Note (Signed)
Decision today to treat with OMT was based on Physical Exam  After verbal consent patient was treated with HVLA, ME, FPR techniques in thoracic, lumbar and sacral areas  Patient tolerated the procedure well with improvement in symptoms  Patient given exercises, stretches and lifestyle modifications  See medications in patient instructions if given  Patient will follow up in 2-4 weeks 

## 2018-08-06 NOTE — Assessment & Plan Note (Signed)
No significant radiculopathy noted today.  We discussed icing regimen and home exercises though.  We discussed that I do believe that some of the patient's pain is secondary to more of patient having a withdrawal from some of them medications that he stopped.  We discussed icing regimen and home exercises.  Discussed gabapentin given a short course.  Patient will follow-up with me again in 2 to 3 weeks

## 2018-08-06 NOTE — Patient Instructions (Addendum)
God to see you  Keep an eye on everything  Gabapentin 200mg  at night to help with some sleep  Watch the other meds a little and get some sleep  I am here is you need me See me again in 3 weeks

## 2018-08-19 ENCOUNTER — Other Ambulatory Visit (INDEPENDENT_AMBULATORY_CARE_PROVIDER_SITE_OTHER): Payer: BLUE CROSS/BLUE SHIELD

## 2018-08-19 ENCOUNTER — Telehealth: Payer: Self-pay | Admitting: Nurse Practitioner

## 2018-08-19 ENCOUNTER — Ambulatory Visit: Payer: BLUE CROSS/BLUE SHIELD | Admitting: Nurse Practitioner

## 2018-08-19 ENCOUNTER — Encounter: Payer: Self-pay | Admitting: Nurse Practitioner

## 2018-08-19 VITALS — BP 130/86 | HR 75 | Ht 67.0 in | Wt 185.0 lb

## 2018-08-19 DIAGNOSIS — F39 Unspecified mood [affective] disorder: Secondary | ICD-10-CM

## 2018-08-19 DIAGNOSIS — R410 Disorientation, unspecified: Secondary | ICD-10-CM | POA: Diagnosis not present

## 2018-08-19 LAB — CBC
HCT: 51.6 % (ref 39.0–52.0)
HEMOGLOBIN: 17.4 g/dL — AB (ref 13.0–17.0)
MCHC: 33.6 g/dL (ref 30.0–36.0)
MCV: 98.5 fl (ref 78.0–100.0)
Platelets: 356 10*3/uL (ref 150.0–400.0)
RBC: 5.24 Mil/uL (ref 4.22–5.81)
RDW: 13.6 % (ref 11.5–15.5)
WBC: 15.2 10*3/uL — ABNORMAL HIGH (ref 4.0–10.5)

## 2018-08-19 LAB — COMPREHENSIVE METABOLIC PANEL
ALT: 17 U/L (ref 0–53)
AST: 12 U/L (ref 0–37)
Albumin: 4.6 g/dL (ref 3.5–5.2)
Alkaline Phosphatase: 43 U/L (ref 39–117)
BUN: 8 mg/dL (ref 6–23)
CO2: 28 mEq/L (ref 19–32)
Calcium: 9.9 mg/dL (ref 8.4–10.5)
Chloride: 105 mEq/L (ref 96–112)
Creatinine, Ser: 0.98 mg/dL (ref 0.40–1.50)
GFR: 88.88 mL/min (ref >60.00)
Glucose, Bld: 106 mg/dL — ABNORMAL HIGH (ref 70–99)
Potassium: 4.3 mEq/L (ref 3.5–5.1)
Sodium: 139 mEq/L (ref 135–145)
Total Bilirubin: 0.4 mg/dL (ref 0.2–1.2)
Total Protein: 6.8 g/dL (ref 6.0–8.3)

## 2018-08-19 LAB — TSH: TSH: 1.37 u[IU]/mL (ref 0.35–4.50)

## 2018-08-19 NOTE — Patient Instructions (Addendum)
Head downstairs for lab work  I will let you know when I get your results back.   Confusion Confusion is the inability to think with the usual speed or clarity. People who are confused often describe their thinking as cloudy or unclear. Confusion can also include feeling disoriented. This means you are unaware of where you are or who you are. You may also not know the date or time. When confused, you may have difficulty remembering, paying attention, or making decisions. Some people also act aggressively when they are confused. In some cases, confusion may come on quickly. In other cases, it may develop slowly over time. How quickly confusion comes on depends on the cause. Confusion may be caused by:  Head injury (concussion).  Seizures.  Stroke.  Fever.  Brain tumor.  Decrease in brain function due to a vascular or neurologic condition (dementia).  Emotions, like rage or terror.  Inability to know what is real and what is not (hallucinations).  Infections, such as a urinary tract infection (UTI).  Using too much alcohol, drugs, or medicines.  Loss of fluid (dehydration) or an imbalance of salts in the body (electrolytes).  Lack of sleep.  Low blood sugar (diabetes).  Low levels of oxygen. This comes from conditions such as chronic lung disorders.  Side effects of medicines, or taking medicines that affect other medicines (drug interactions).  Lack of certain nutrients, especially niacin, thiamine, vitamin C, or vitamin B.  Sudden drop in body temperature (hypothermia).  Change in routine, such as traveling or being hospitalized. Follow these instructions at home: Pay attention to your symptoms. Tell your health care provider about any changes or if you develop new symptoms. Follow these instructions to control or treat symptoms. Ask a family member or friend for help if needed. Medicines  Take over-the-counter and prescription medicines only as told by your health  care provider.  Ask your health care provider about changing or stopping any medicines that may be causing your confusion.  Avoid pain medicines or sleep medicines until you have fully recovered.  Use a pillbox or an alarm to help you take the right medicines at the right time. Lifestyle   Eat a balanced diet that includes fruits and vegetables.  Get enough sleep. For most adults, this is 7-9 hours each night.  Do not drink alcohol.  Do not become isolated. Spend time with other people and make plans for your days.  Do not drive until your health care provider says that it is safe to do so.  Do not use any products that contain nicotine or tobacco, such as cigarettes and e-cigarettes. If you need help quitting, ask your health care provider.  Stop other activities that may increase your chances of getting hurt. These may include some work duties, sports activities, swimming, or bike riding. Ask your health care provider what activities are safe for you. What caregivers can do  Find out if the person is confused. Ask the person to state his or her name, age, and the date. If the person is unsure or answers incorrectly, he or she may be confused.  Always introduce yourself, no matter how well the person knows you.  Remind the person of his or her location. Do this often.  Place a calendar and clock near the person who is confused.  Talk about current events and plans for the day.  Keep the environment calm, quiet, and peaceful.  Help the person do the things that he or she  is unable to do. These include: ? Taking medicines. ? Keeping follow-up visits with his or her health care provider. ? Helping with household duties, including meal preparation. ? Running errands.  Get help if you need it. There are several support groups for caregivers.  If the person you are helping needs more support, consider day care, extended care programs, or a skilled nursing facility. The  person's health care provider may be able to help evaluate these options. General instructions  Monitor yourself for any conditions you may have. These may include: ? Checking your blood glucose levels, if you have diabetes. ? Watching your weight, if you are overweight. ? Monitoring your blood pressure, if you have hypertension. ? Monitoring your body temperature, if you have a fever.  Keep all follow-up visits as told by your health care provider. This is important. Contact a health care provider if:  Your symptoms get worse. Get help right away if you:  Feel that you are not able to care for yourself.  Develop severe headaches, repeated vomiting, seizures, blackouts, or slurred speech.  Have increasing confusion, weakness, numbness, restlessness, or personality changes.  Develop a loss of balance, have marked dizziness, feel uncoordinated, or fall.  Develop severe anxiety, or you have delusions or hallucinations. These symptoms may represent a serious problem that is an emergency. Do not wait to see if the symptoms will go away. Get medical help right away. Call your local emergency services (911 in the [place] Do not drive yourself to the hospital. Summary  Confusion is the inability to think with the usual speed or clarity. People who are confused often describe their thinking as cloudy or unclear.  Confusion can also include having difficulty remembering, paying attention, or making decisions.  Confusion may come on quickly or develop slowly over time, depending on the cause. There are many different causes of confusion.  Ask for help from family members or friends if you are unable to take care of yourself. This information is not intended to replace advice given to you by your health care provider. Make sure you discuss any questions you have with your health care provider. Document Released: 08/01/2004 Document Revised: 06/26/2017 Document Reviewed: 06/26/2017 Elsevier  Interactive Patient Education  2019 [employer]

## 2018-08-19 NOTE — Progress Notes (Signed)
Jacob [name] is a 33 y.o. male with the following history as recorded in [employer]:  Patient Active Problem List   Diagnosis Date Noted  . Acute bursitis of right shoulder 09/04/2017  . Nonallopathic lesion of thoracic region 07/31/2017  . Nonallopathic lesion of sacral region 07/31/2017  . Nonallopathic lesion of lumbosacral region 07/31/2017  . Hypertension 07/11/2017  . Chronic bilateral low back pain with right-sided sciatica 06/06/2017  . Intermittent explosive disorder in adult   . Bipolar II disorder (HCC) 08/07/2016  . Bipolar 2 disorder (HCC) 08/07/2016  . Mood disorder (HCC) 04/30/2016  . Moderate episode of recurrent major depressive disorder (HCC) 04/29/2016  . Asthma 04/09/2016  . Allergic rhinitis 04/09/2016  . Routine general medical examination at a health care facility 10/26/2014  . Bipolar 1 disorder, mixed, moderate (HCC) 05/05/2014  . ADHD (attention deficit hyperactivity disorder) 05/05/2014    Current Outpatient Medications  Medication Sig Dispense Refill  . albuterol (PROVENTIL HFA;VENTOLIN HFA) 108 (90 Base) MCG/ACT inhaler Inhale 2 puffs into the lungs every 6 (six) hours as needed for wheezing or shortness of breath. 1 Inhaler 2  . b complex vitamins tablet Take 1 tablet by mouth daily.    Marland Kitchen BYSTOLIC 5 MG tablet TAKE 1 TABLET BY MOUTH EVERY DAY (Patient taking differently: Take 5 mg by mouth daily. ) 90 tablet 0  . chlorproMAZINE (THORAZINE) 50 MG tablet Take 1 tablet (50 mg total) by mouth daily as needed (for agitation). 90 tablet 1  . Diclofenac Sodium (PENNSAID) 2 % SOLN Place 2 g onto the skin 2 (two) times daily. 112 g 3  . Diclofenac Sodium 2 % SOLN Place 2 g onto the skin 2 (two) times daily. 112 g 3  . fluticasone furoate-vilanterol (BREO ELLIPTA) 200-25 MCG/INH AEPB Inhale 1 puff into the lungs daily. 2 each 0  . gabapentin (NEURONTIN) 100 MG capsule Take 2 capsules (200 mg total) by mouth at bedtime. 60 capsule 3  . GuanFACINE HCl 3 MG TB24 Take 1  tablet (3 mg total) by mouth 2 (two) times daily. 6 mg total daily 180 tablet 1  . lisdexamfetamine (VYVANSE) 70 MG capsule Take 1 capsule (70 mg total) by mouth daily. 90 capsule 0  . lithium carbonate (ESKALITH) 450 MG CR tablet Take 1 tablet (450 mg total) by mouth 2 (two) times daily. 180 tablet 1   No current facility-administered medications for this visit.     Allergies: Amlodipine  Past Medical History:  Diagnosis Date  . ADHD (attention deficit hyperactivity disorder) evaluation   . Allergy   . Bipolar disorder (HCC)     History reviewed. No pertinent surgical history.  Family History  Problem Relation Age of Onset  . Cancer Mother     Social History   Tobacco Use  . Smoking status: Current Every Day Smoker    Packs/day: 0.50    Years: 9.00    Pack years: 4.50    Types: Cigarettes    Last attempt to quit: 12/07/2010    Years since quitting: 7.7  . Smokeless tobacco: Never Used  Substance Use Topics  . Alcohol use: No    Comment: Rare     Subjective:  Jacob [name] is here today for ED follow up , requesting FMLA paperwork He was seen in the ED on 08/04/18 for c/o "confusion, off balance, dizziness, slurred speech." His symptoms seemed to start after he mistakenly took 6 medrol pills at once, which was prescribed to him on 07/30/18 during  a visit for cough, back pain. On 1/24, he says he took 6 medrol pills at once rather than spacing the 6 pills through the day as instructed, and after this began to notice the confusion, dizziness and slurred speech. He continued medrol pack until 1/28, when he decided to go to ED because his symptoms were no better He was then told to stop medrol course- did not complete last 2 days, but does say cough and congestion have resolved. In the ED, his lithium level was slightly low, white count elevated on CBC, normal BMET, CT head WNL unremarkable aside from chronic opacification of right frontal sinus. He was discharged home from ed with  instruction to f/u with PCP if symptoms persisted, he was given meclizine and vertigo exercises to try for his dizziness which he says he has tried without improvement. He then f/u with sports med provider yesterday, with reported improvement in back pain. He tells me that he has continued to feel confused, off balance, dizzy since his ED visit, symptoms are no better. He has also felt his speech is slurred and hes noticed a metallic taste in his mouth. Hes not sure when his next f/u with psychiatry is. He does say he's missed a dose of lithium "here and there" He denies any use of alcohol or illegal drugs He is currently going through divorce but says he does not feel too stressed about that. 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   ROS- See HPI  Objective:  Vitals:   08/19/18 1258  BP: 130/86  Pulse: 75  SpO2: 98%  Weight: 185 lb (83.9 kg)  Height: 5\' 7"  (1.702 m)    General: Well developed, well nourished, in no acute distress  Skin : Warm and dry.  Head: Normocephalic and atraumatic  Eyes: Sclera and conjunctiva clear; pupils round and reactive to light; extraocular movements intact  Oropharynx: Pink, supple. No suspicious lesions  Neck: Supple without thyromegaly, adenopathy  Lungs: Respirations unlabored; clear to auscultation bilaterally without wheeze, rales, rhonchi  CVS exam: normal rate, regular rhythm, normal S1, S2, no murmurs, rubs, clicks or gallops. Abdomen: Soft; nontender; nondistended; no masses or hepatosplenomegaly  Musculoskeletal: No deformities; no active joint inflammation; normal ROM  Extremities: No edema, cyanosis, clubbing  Vessels: Symmetric bilaterally  Neurologic: Alert and oriented; speech slowed; face symmetrical; moves all extremities well; CNII-XII intact without focal  deficit Psychiatric: Normal mood and affect.  Assessment:  1. Confusion   2. Mood disorder (HCC)     Plan:   Actually sounds like he took his medrol as prescribed, low suspicion that medrol course is still contributing to his symptoms Unsure if his symptoms are related to mood disorder or another underlying problem  Additional labs ordered today for further evaluation He was instructed to schedule f/u with psychiatry- he requested lithium level today which he says he can forward to his psychiatrist via my chart F/U with further recommendations pending lab results-consider additional testing/referral to psychiatry based on labs Will provide FMLA as requested   No follow-ups on file.  Orders Placed This Encounter  Procedures  . Comprehensive metabolic panel    Standing Status:   Future    Number of Occurrences:   1    Standing Expiration Date:   08/20/2019  . CBC    Standing Status:  Future    Number of Occurrences:   1    Standing Expiration Date:   08/20/2019  . TSH    Standing Status:   Future    Number of Occurrences:   1    Standing Expiration Date:   08/20/2019  . Pain Mgmt, Profile 8 w/Conf, U    Standing Status:   Future    Number of Occurrences:   1    Standing Expiration Date:   08/20/2019  . Lithium level    Standing Status:   Future    Number of Occurrences:   1    Standing Expiration Date:   08/19/2019    Requested Prescriptions    No prescriptions requested or ordered in this encounter

## 2018-08-19 NOTE — Telephone Encounter (Signed)
Patient is requesting FMLA for 02/10 to 05/04 due to his confusion and mood disorder.   Forms have been completed & placed in provider box to review and sign.

## 2018-08-20 DIAGNOSIS — Z0279 Encounter for issue of other medical certificate: Secondary | ICD-10-CM

## 2018-08-20 NOTE — Telephone Encounter (Signed)
Forms have been signed, Copy sent to scan, &charged for.  Patient informed and wanted to pick up forms and did not want them faxed.

## 2018-08-22 LAB — PAIN MGMT, PROFILE 8 W/CONF, U
6 Acetylmorphine: NEGATIVE ng/mL (ref <10)
AMPHETAMINES: POSITIVE ng/mL — AB (ref <500)
Alcohol Metabolites: NEGATIVE ng/mL (ref <500)
Amphetamine: 605 ng/mL — ABNORMAL HIGH (ref <250)
Benzodiazepines: NEGATIVE ng/mL (ref <100)
Buprenorphine, Urine: NEGATIVE ng/mL (ref <5)
Cocaine Metabolite: NEGATIVE ng/mL (ref <150)
Creatinine: 177.3 mg/dL
MDMA: NEGATIVE ng/mL (ref <500)
Marijuana Metabolite: NEGATIVE ng/mL (ref <20)
Methamphetamine: NEGATIVE ng/mL (ref <250)
Opiates: NEGATIVE ng/mL (ref <100)
Oxidant: NEGATIVE ug/mL (ref <200)
Oxycodone: NEGATIVE ng/mL (ref <100)
pH: 7.14 (ref 4.5–9.0)

## 2018-08-22 LAB — LITHIUM LEVEL: Lithium Lvl: 0.9 mmol/L (ref 0.6–1.2)

## 2018-08-24 ENCOUNTER — Encounter: Payer: Self-pay | Admitting: Nurse Practitioner

## 2018-08-26 NOTE — Progress Notes (Signed)
[name] [address] [name], [place] [address] Phone: [phone] Subjective:    I Jacob [name] am serving as a Neurosurgeon for Dr. Antoine Primas.   I'm seeing this patient by the request  of:    CC: Back and neck pain follow-up  [id number]  Jacob [name] is a 33 y.o. male coming in with complaint of back pain. States that he is doing well.  Patient was having difficulty with his lithium.  Feels like he is at a proper dose at the moment.  Making progress.  Discussed icing regimen and home exercises.  Has been doing them occasionally but not as regularly.  Some mild tightness in the back but no radiation of the pain     Past Medical History:  Diagnosis Date  . ADHD (attention deficit hyperactivity disorder) evaluation   . Allergy   . Bipolar disorder (HCC)    No past surgical history on file. Social History   Socioeconomic History  . Marital status: Married    Spouse name: Not on file  . Number of children: 0  . Years of education: 51  . Highest education level: Not on file  Occupational History  . Occupation: Asst Event organiser: [name]  Social Needs  . Financial resource strain: Not on file  . Food insecurity:    Worry: Not on file    Inability: Not on file  . Transportation needs:    Medical: Not on file    Non-medical: Not on file  Tobacco Use  . Smoking status: Current Every Day Smoker    Packs/day: 0.50    Years: 9.00    Pack years: 4.50    Types: Cigarettes    Last attempt to quit: 12/07/2010    Years since quitting: 7.7  . Smokeless tobacco: Never Used  Substance and Sexual Activity  . Alcohol use: No    Comment: Rare  . Drug use: No  . Sexual activity: Yes    Partners: Female    Birth control/protection: Condom, Diaphragm  Lifestyle  . Physical activity:    Days per week: Not on file    Minutes per session: Not on file  . Stress: Not on file  Relationships  . Social connections:    Talks on  phone: Not on file    Gets together: Not on file    Attends religious service: Not on file    Active member of club or organization: Not on file    Attends meetings of clubs or organizations: Not on file    Relationship status: Not on file  Other Topics Concern  . Not on file  Social History Narrative   Born and raised in [place]. Currently live in private residence with wife Jacob [name]). 3 cats, 2 dogs.   Fun: Engineer, petroleum   Denies religious beliefs that would effect healthcare.    Allergies  Allergen Reactions  . Amlodipine Other (See Comments)    Head felt foggy   Family History  Problem Relation Age of Onset  . Cancer Mother      Current Outpatient Medications (Cardiovascular):  .  BYSTOLIC 5 MG tablet, TAKE 1 TABLET BY MOUTH EVERY DAY (Patient taking differently: Take 5 mg by mouth daily. )  Current Outpatient Medications (Respiratory):  .  albuterol (PROVENTIL HFA;VENTOLIN HFA) 108 (90 Base) MCG/ACT inhaler, Inhale 2 puffs into the lungs every 6 (six) hours as needed for wheezing or shortness of breath. Marland Kitchen  fluticasone furoate-vilanterol (BREO ELLIPTA) 200-25 MCG/INH AEPB, Inhale 1 puff into the lungs daily.    Current Outpatient Medications (Other):  .  b complex vitamins tablet, Take 1 tablet by mouth daily. .  chlorproMAZINE (THORAZINE) 50 MG tablet, Take 1 tablet (50 mg total) by mouth daily as needed (for agitation). .  Diclofenac Sodium (PENNSAID) 2 % SOLN, Place 2 g onto the skin 2 (two) times daily. .  Diclofenac Sodium 2 % SOLN, Place 2 g onto the skin 2 (two) times daily. Marland Kitchen  gabapentin (NEURONTIN) 100 MG capsule, Take 2 capsules (200 mg total) by mouth at bedtime. .  GuanFACINE HCl 3 MG TB24, Take 1 tablet (3 mg total) by mouth 2 (two) times daily. 6 mg total daily .  lisdexamfetamine (VYVANSE) 70 MG capsule, Take 1 capsule (70 mg total) by mouth daily. Marland Kitchen  lithium carbonate (ESKALITH) 450 MG CR tablet, Take 1 tablet (450 mg total) by mouth 2 (two)  times daily.    Past medical history, social, surgical and family history all reviewed in electronic medical record.  No pertanent information unless stated regarding to the chief complaint.   Review of Systems:  No headache, visual changes, nausea, vomiting, diarrhea, constipation, dizziness, abdominal pain, skin rash, fevers, chills, night sweats, weight loss, swollen lymph nodes, body aches, joint swelling,  chest pain, shortness of breath, mood changes.  Positive muscle aches  Objective  Blood pressure 140/82, pulse 71, height 5\' 7"  (1.702 m), weight 185 lb (83.9 kg), SpO2 98 %.   General: No apparent distress alert and oriented x3 mood and affect normal, dressed appropriately.  HEENT: Pupils equal, extraocular movements intact  Respiratory: Patient's speak in full sentences and does not appear short of breath  Cardiovascular: No lower extremity edema, non tender, no erythema  Skin: Warm dry intact with no signs of infection or rash on extremities or on axial skeleton.  Abdomen: Soft nontender  Neuro: Cranial nerves II through XII are intact, neurovascularly intact in all extremities with 2+ DTRs and 2+ pulses.  Lymph: No lymphadenopathy of posterior or anterior cervical chain or axillae bilaterally.  Gait normal with good balance and coordination.  MSK:  Non tender with full range of motion and good stability and symmetric strength and tone of shoulders, elbows, wrist, hip, knee and ankles bilaterally.  Back Exam:  Inspection: Also lordosis Motion: Flexion 45 deg, Extension 25 deg, Side Bending to 35 deg bilaterally,  Rotation to 35 deg bilaterally  SLR laying: Negative  XSLR laying: Negative  Palpable tenderness: Tender to palpation bilaterally in the right sacroiliac joint. FABER: negative. Sensory change: Gross sensation intact to all lumbar and sacral dermatomes.  Reflexes: 2+ at both patellar tendons, 2+ at achilles tendons, Babinski's downgoing.  Strength at foot    Plantar-flexion: 5/5 Dorsi-flexion: 5/5 Eversion: 5/5 Inversion: 5/5  Leg strength  Quad: 5/5 Hamstring: 5/5 Hip flexor: 5/5 Hip abductors: 5/5  Gait unremarkable.   Osteopathic findings  T7 extended rotated and side bent left L2 flexed rotated and side bent right Sacrum right on right    Impression and Recommendations:     This case required medical decision making of moderate complexity. The above documentation has been reviewed and is accurate and complete Judi Saa, DO       Note: This dictation was prepared with Dragon dictation along with smaller phrase technology. Any transcriptional errors that result from this process are unintentional.

## 2018-08-27 ENCOUNTER — Encounter: Payer: Self-pay | Admitting: Family Medicine

## 2018-08-27 ENCOUNTER — Ambulatory Visit: Payer: BLUE CROSS/BLUE SHIELD | Admitting: Family Medicine

## 2018-08-27 VITALS — BP 140/82 | HR 71 | Ht 67.0 in | Wt 185.0 lb

## 2018-08-27 DIAGNOSIS — M5441 Lumbago with sciatica, right side: Secondary | ICD-10-CM | POA: Diagnosis not present

## 2018-08-27 DIAGNOSIS — M999 Biomechanical lesion, unspecified: Secondary | ICD-10-CM | POA: Diagnosis not present

## 2018-08-27 DIAGNOSIS — G8929 Other chronic pain: Secondary | ICD-10-CM | POA: Diagnosis not present

## 2018-08-27 NOTE — Assessment & Plan Note (Signed)
No radicular symptoms at this time.  I do believe some of it seems to be more positional.  Discussed icing regimen and home exercise.  Discussed which activities to do which was to avoid.  We discussed posture and ergonomics.  Patient responded well to manipulation.  Follow-up again in 4 to 5 weeks

## 2018-08-27 NOTE — Assessment & Plan Note (Signed)
Decision today to treat with OMT was based on Physical Exam  After verbal consent patient was treated with HVLA, ME, FPR techniques in  thoracic, lumbar and sacral areas  Patient tolerated the procedure well with improvement in symptoms  Patient given exercises, stretches and lifestyle modifications  See medications in patient instructions if given  Patient will follow up in 4-5 weeks 

## 2018-08-27 NOTE — Patient Instructions (Signed)
Good to see you  Much better then last time Stay active and find time for yourself.  Get back on the exercises  See me again in 4 weeks

## 2018-09-07 ENCOUNTER — Ambulatory Visit: Payer: BLUE CROSS/BLUE SHIELD | Admitting: Internal Medicine

## 2018-09-07 ENCOUNTER — Encounter: Payer: Self-pay | Admitting: Internal Medicine

## 2018-09-07 VITALS — BP 146/88 | HR 101 | Temp 98.0°F | Ht 67.0 in | Wt 183.0 lb

## 2018-09-07 DIAGNOSIS — I10 Essential (primary) hypertension: Secondary | ICD-10-CM

## 2018-09-07 DIAGNOSIS — J320 Chronic maxillary sinusitis: Secondary | ICD-10-CM

## 2018-09-07 DIAGNOSIS — J329 Chronic sinusitis, unspecified: Secondary | ICD-10-CM | POA: Insufficient documentation

## 2018-09-07 DIAGNOSIS — Z23 Encounter for immunization: Secondary | ICD-10-CM | POA: Diagnosis not present

## 2018-09-07 MED ORDER — LOSARTAN POTASSIUM 100 MG PO TABS: 100.0000 mg | ORAL_TABLET | Freq: Every day | ORAL | 3 refills | Status: DC

## 2018-09-07 MED ORDER — AMOXICILLIN-POT CLAVULANATE 875-125 MG PO TABS: 1.0000 | ORAL_TABLET | Freq: Two times a day (BID) | ORAL | 0 refills | Status: DC

## 2018-09-07 NOTE — Addendum Note (Signed)
Addended by: Scarlett Presto on: 09/07/2018 04:54 PM   Modules accepted: Orders

## 2018-09-07 NOTE — Assessment & Plan Note (Signed)
R side Augmentin x 2 wks

## 2018-09-07 NOTE — Patient Instructions (Addendum)
  EXAM: CT HEAD WITHOUT CONTRAST  TECHNIQUE: Contiguous axial images were obtained from the base of the skull through the vertex without intravenous contrast.  COMPARISON:  No comparison studies available.  FINDINGS: Brain: There is no evidence for acute hemorrhage, hydrocephalus, mass lesion, or abnormal extra-axial fluid collection. No definite CT evidence for acute infarction.  Vascular: No hyperdense vessel or unexpected calcification.  Skull: No evidence for fracture. No worrisome lytic or sclerotic lesion.  Sinuses/Orbits: Chronic opacification noted right frontal sinus. Remaining visualized paranasal sinuses and mastoid air cells are clear. Visualized portions of the globes and intraorbital fat are unremarkable.  Other: None.  IMPRESSION: Chronic opacification right frontal sinus. Otherwise unremarkable exam.   Electronically Signed   By: Kennith Center M.D.   On: 08/04/2018 17:53   Nasal rinse Weighted blanket

## 2018-09-07 NOTE — Progress Notes (Signed)
Subjective:  Patient ID: Jacob [name], male    DOB: [birth year]  Age: 33 y.o. MRN: [id number]  CC: No chief complaint on file.   HPI Jacob [name] presents for a sinus congestion ADHD for a long time -was tested Separated since Sept 2019 - 1 child Face twitching and a weird sensation in December when he put his legs up for some time - 15-20 min; R foot/leg is heavy, R arm would not cooperate - it would shake.Marland KitchenMarland KitchenBipolar disorder 1. Smoker 1/2 ppd No ETOH   Outpatient Medications Prior to Visit  Medication Sig Dispense Refill  . albuterol (PROVENTIL HFA;VENTOLIN HFA) 108 (90 Base) MCG/ACT inhaler Inhale 2 puffs into the lungs every 6 (six) hours as needed for wheezing or shortness of breath. 1 Inhaler 2  . b complex vitamins tablet Take 1 tablet by mouth daily.    . Diclofenac Sodium (PENNSAID) 2 % SOLN Place 2 g onto the skin 2 (two) times daily. 112 g 3  . Diclofenac Sodium 2 % SOLN Place 2 g onto the skin 2 (two) times daily. 112 g 3  . fluticasone furoate-vilanterol (BREO ELLIPTA) 200-25 MCG/INH AEPB Inhale 1 puff into the lungs daily. 2 each 0  . lisdexamfetamine (VYVANSE) 70 MG capsule Take 1 capsule (70 mg total) by mouth daily. 90 capsule 0  . BYSTOLIC 5 MG tablet TAKE 1 TABLET BY MOUTH EVERY DAY (Patient not taking: No sig reported) 90 tablet 0  . chlorproMAZINE (THORAZINE) 50 MG tablet Take 1 tablet (50 mg total) by mouth daily as needed (for agitation). (Patient not taking: Reported on 09/07/2018) 90 tablet 1  . gabapentin (NEURONTIN) 100 MG capsule Take 2 capsules (200 mg total) by mouth at bedtime. (Patient not taking: Reported on 09/07/2018) 60 capsule 3  . GuanFACINE HCl 3 MG TB24 Take 1 tablet (3 mg total) by mouth 2 (two) times daily. 6 mg total daily (Patient not taking: Reported on 09/07/2018) 180 tablet 1  . lithium carbonate (ESKALITH) 450 MG CR tablet Take 1 tablet (450 mg total) by mouth 2 (two) times daily. (Patient not taking: Reported on 09/07/2018) 180 tablet 1   No  facility-administered medications prior to visit.     ROS: Review of Systems  Constitutional: Negative for appetite change, fatigue and unexpected weight change.  HENT: Positive for congestion, postnasal drip and sinus pain. Negative for nosebleeds, sneezing, sore throat and trouble swallowing.   Eyes: Negative for itching and visual disturbance.  Respiratory: Negative for cough.   Cardiovascular: Negative for chest pain, palpitations and leg swelling.  Gastrointestinal: Negative for abdominal distention, blood in stool, diarrhea and nausea.  Genitourinary: Negative for frequency and hematuria.  Musculoskeletal: Negative for back pain, gait problem, joint swelling and neck pain.  Skin: Negative for rash.  Neurological: Positive for tremors and facial asymmetry. Negative for dizziness, speech difficulty and weakness.  Psychiatric/Behavioral: Negative for agitation, dysphoric mood, sleep disturbance and suicidal ideas. The patient is not nervous/anxious.     Objective:  BP (!) 146/88 (BP Location: Left Arm, Patient Position: Sitting, Cuff Size: Large)   Pulse (!) 101   Temp 98 F (36.7 C) (Oral)   Ht 5\' 7"  (1.702 m)   Wt 183 lb (83 kg)   SpO2 98%   BMI 28.66 kg/m   BP Readings from Last 3 Encounters:  09/07/18 (!) 146/88  08/27/18 140/82  08/19/18 130/86    Wt Readings from Last 3 Encounters:  09/07/18 183 lb (83 kg)  08/27/18 185  lb (83.9 kg)  08/19/18 185 lb (83.9 kg)    Physical Exam Constitutional:      General: He is not in acute distress.    Appearance: He is well-developed.     Comments: NAD  Eyes:     Conjunctiva/sclera: Conjunctivae normal.     Pupils: Pupils are equal, round, and reactive to light.  Neck:     Musculoskeletal: Normal range of motion.     Thyroid: No thyromegaly.     Vascular: No JVD.  Cardiovascular:     Rate and Rhythm: Normal rate and regular rhythm.     Heart sounds: Normal heart sounds. No murmur. No friction rub. No gallop.     Pulmonary:     Effort: Pulmonary effort is normal. No respiratory distress.     Breath sounds: Normal breath sounds. No wheezing or rales.  Chest:     Chest wall: No tenderness.  Abdominal:     General: Bowel sounds are normal. There is no distension.     Palpations: Abdomen is soft. There is no mass.     Tenderness: There is no abdominal tenderness. There is no guarding or rebound.  Musculoskeletal: Normal range of motion.        General: No tenderness.  Lymphadenopathy:     Cervical: No cervical adenopathy.  Skin:    General: Skin is warm and dry.     Findings: No rash.  Neurological:     Mental Status: He is alert and oriented to person, place, and time.     Cranial Nerves: No cranial nerve deficit.     Motor: No abnormal muscle tone.     Coordination: Coordination normal.     Gait: Gait normal.     Deep Tendon Reflexes: Reflexes are normal and symmetric.  Psychiatric:        Behavior: Behavior normal.        Thought Content: Thought content normal.        Judgment: Judgment normal.   Twitching face Restless A little ataxic eryth snares  Lab Results  Component Value Date   WBC 15.2 (H) 08/19/2018   HGB 17.4 (H) 08/19/2018   HCT 51.6 08/19/2018   PLT 356.0 08/19/2018   GLUCOSE 106 (H) 08/19/2018   CHOL 128 08/09/2016   TRIG 97 08/09/2016   HDL 29 (L) 08/09/2016   LDLCALC 80 08/09/2016   ALT 17 08/19/2018   AST 12 08/19/2018   NA 139 08/19/2018   K 4.3 08/19/2018   CL 105 08/19/2018   CREATININE 0.98 08/19/2018   BUN 8 08/19/2018   CO2 28 08/19/2018   TSH 1.37 08/19/2018   HGBA1C 5.2 10/02/2017    Ct Head Wo Contrast  Result Date: 08/04/2018 CLINICAL DATA:  Confusion. EXAM: CT HEAD WITHOUT CONTRAST TECHNIQUE: Contiguous axial images were obtained from the base of the skull through the vertex without intravenous contrast. COMPARISON:  No comparison studies available. FINDINGS: Brain: There is no evidence for acute hemorrhage, hydrocephalus, mass lesion, or  abnormal extra-axial fluid collection. No definite CT evidence for acute infarction. Vascular: No hyperdense vessel or unexpected calcification. Skull: No evidence for fracture. No worrisome lytic or sclerotic lesion. Sinuses/Orbits: Chronic opacification noted right frontal sinus. Remaining visualized paranasal sinuses and mastoid air cells are clear. Visualized portions of the globes and intraorbital fat are unremarkable. Other: None. IMPRESSION: Chronic opacification right frontal sinus. Otherwise unremarkable exam. Electronically Signed   By: Kennith Center M.D.   On: 08/04/2018 17:53    Assessment &  Plan:   There are no diagnoses linked to this encounter.   No orders of the defined types were placed in this encounter.    Follow-up: No follow-ups on file.  Walker Kehr, MD

## 2018-09-07 NOTE — Assessment & Plan Note (Signed)
Losartan 

## 2018-09-09 NOTE — Telephone Encounter (Signed)
Patient came by the office and dropped off FMLA forms to be completed.  Placed in Brittany's box for completion.

## 2018-09-10 NOTE — Telephone Encounter (Signed)
Attending Physician paperwork has been completed. Contacted pt, states he will come pick up paperwork at front office.

## 2018-09-16 NOTE — Telephone Encounter (Signed)
Patient dropped forms off again after Dr. Macario Golds went on [place].  Patient states forms need Dr. Hyman Bible signature.  Also, needs a listing of side effects from medications prescribed by Dr. Macario Golds and he states limitations needs to be completed.  Placed in Brittany's box.

## 2018-09-18 DIAGNOSIS — F6381 Intermittent explosive disorder: Secondary | ICD-10-CM | POA: Diagnosis not present

## 2018-09-23 ENCOUNTER — Encounter: Payer: Self-pay | Admitting: Internal Medicine

## 2018-09-23 NOTE — Progress Notes (Signed)
[name] [address] [place], [place] [address] Phone: [phone] Subjective:   Bruce Donath, am serving as a scribe for Dr. Antoine Primas.\   CC: Back pain follow-up  [id number]  Jacob [name] is a 32 y.o. male coming in with complaint of back pain. Patient has been feeling good. Has not had an flare ups.  Patient feels like he is getting back to his baseline at this moment. Overall feels much better    Past Medical History:  Diagnosis Date  . ADHD (attention deficit hyperactivity disorder) evaluation   . Allergy   . Bipolar disorder (HCC)    No past surgical history on file. Social History   Socioeconomic History  . Marital status: Legally Separated    Spouse name: Not on file  . Number of children: 0  . Years of education: 65  . Highest education level: Not on file  Occupational History  . Occupation: Asst Event organiser: [employer]  Social Needs  . Financial resource strain: Not on file  . Food insecurity:    Worry: Not on file    Inability: Not on file  . Transportation needs:    Medical: Not on file    Non-medical: Not on file  Tobacco Use  . Smoking status: Current Every Day Smoker    Packs/day: 0.50    Years: 9.00    Pack years: 4.50    Types: Cigarettes    Last attempt to quit: 12/07/2010    Years since quitting: 7.8  . Smokeless tobacco: Never Used  Substance and Sexual Activity  . Alcohol use: No    Comment: Rare  . Drug use: No  . Sexual activity: Yes    Partners: Female    Birth control/protection: Condom, Diaphragm  Lifestyle  . Physical activity:    Days per week: Not on file    Minutes per session: Not on file  . Stress: Not on file  Relationships  . Social connections:    Talks on phone: Not on file    Gets together: Not on file    Attends religious service: Not on file    Active member of club or organization: Not on file    Attends meetings of clubs or organizations: Not on file     Relationship status: Not on file  Other Topics Concern  . Not on file  Social History Narrative   Born and raised in [place]. Currently live in private residence with wife [name]). 3 cats, 2 dogs.   Fun: Engineer, petroleum   Denies religious beliefs that would effect healthcare.    Allergies  Allergen Reactions  . Amlodipine Other (See Comments)    Head felt foggy   Family History  Problem Relation Age of Onset  . Cancer Mother      Current Outpatient Medications (Cardiovascular):  .  losartan (COZAAR) 100 MG tablet, Take 1 tablet (100 mg total) by mouth daily.  Current Outpatient Medications (Respiratory):  .  albuterol (PROVENTIL HFA;VENTOLIN HFA) 108 (90 Base) MCG/ACT inhaler, Inhale 2 puffs into the lungs every 6 (six) hours as needed for wheezing or shortness of breath. .  fluticasone furoate-vilanterol (BREO ELLIPTA) 200-25 MCG/INH AEPB, Inhale 1 puff into the lungs daily.    Current Outpatient Medications (Other):  .  amoxicillin-clavulanate (AUGMENTIN) 875-125 MG tablet, Take 1 tablet by mouth 2 (two) times daily. Marland Kitchen  b complex vitamins tablet, Take 1 tablet by mouth daily. Marland Kitchen  Diclofenac Sodium (PENNSAID) 2 % SOLN, Place 2 g onto the skin 2 (two) times daily. .  Diclofenac Sodium 2 % SOLN, Place 2 g onto the skin 2 (two) times daily. Marland Kitchen  lisdexamfetamine (VYVANSE) 70 MG capsule, Take 1 capsule (70 mg total) by mouth daily.    Past medical history, social, surgical and family history all reviewed in electronic medical record.  No pertanent information unless stated regarding to the chief complaint.   Review of Systems:  No headache, visual changes, nausea, vomiting, diarrhea, constipation, dizziness, abdominal pain, skin rash, fevers, chills, night sweats, weight loss, swollen lymph nodes, body aches, joint swelling,  chest pain, shortness of breath, mood changes.  Positive muscle aches  Objective  Blood pressure 126/90, pulse 88, height 5\' 7"  (1.702 m),  weight 180 lb (81.6 kg), SpO2 99 %.    General: No apparent distress alert and oriented x3 mood and affect normal, dressed appropriately.  HEENT: Pupils equal, extraocular movements intact  Respiratory: Patient's speak in full sentences and does not appear short of breath  Cardiovascular: No lower extremity edema, non tender, no erythema  Skin: Warm dry intact with no signs of infection or rash on extremities or on axial skeleton.  Abdomen: Soft nontender  Neuro: Cranial nerves II through XII are intact, neurovascularly intact in all extremities with 2+ DTRs and 2+ pulses.  Lymph: No lymphadenopathy of posterior or anterior cervical chain or axillae bilaterally.  Gait normal with good balance and coordination.  MSK:  Non tender with full range of motion and good stability and symmetric strength and tone of shoulders, elbows, wrist, hip, knee and ankles bilaterally.  Back Exam:  Inspection: Loss of lordosis Motion: Flexion 35 deg, Extension 25 deg, Side Bending to 35 deg bilaterally,  Rotation to 45 deg bilaterally  SLR laying: Negative  XSLR laying: Negative  Palpable tenderness: To palpation in the paraspinal musculature.Marland Kitchen FABER: negative. Sensory change: Gross sensation intact to all lumbar and sacral dermatomes.  Reflexes: 2+ at both patellar tendons, 2+ at achilles tendons, Babinski's downgoing.  Strength at foot  Plantar-flexion: 5/5 Dorsi-flexion: 5/5 Eversion: 5/5 Inversion: 5/5  Leg strength  Quad: 5/5 Hamstring: 5/5 Hip flexor: 5/5 Hip abductors: 5/5   Osteopathic findings C6 flexed rotated and side bent left T3 extended rotated and side bent right inhaled third rib T7 extended rotated and side bent left L4 flexed rotated and side bent left  Sacrum right on right     Impression and Recommendations:     This case required medical decision making of moderate complexity. The above documentation has been reviewed and is accurate and complete Judi Saa, DO        Note: This dictation was prepared with Dragon dictation along with smaller phrase technology. Any transcriptional errors that result from this process are unintentional.

## 2018-09-24 ENCOUNTER — Other Ambulatory Visit: Payer: Self-pay

## 2018-09-24 ENCOUNTER — Encounter: Payer: Self-pay | Admitting: Family Medicine

## 2018-09-24 ENCOUNTER — Ambulatory Visit: Payer: BLUE CROSS/BLUE SHIELD | Admitting: Family Medicine

## 2018-09-24 VITALS — BP 126/90 | HR 88 | Ht 67.0 in | Wt 180.0 lb

## 2018-09-24 DIAGNOSIS — G8929 Other chronic pain: Secondary | ICD-10-CM

## 2018-09-24 DIAGNOSIS — M999 Biomechanical lesion, unspecified: Secondary | ICD-10-CM | POA: Diagnosis not present

## 2018-09-24 DIAGNOSIS — M5441 Lumbago with sciatica, right side: Secondary | ICD-10-CM | POA: Diagnosis not present

## 2018-09-24 NOTE — Patient Instructions (Signed)
Go to see you  Jacob [name] is your friend Stay active I am glad much better See me again in 10 weeks

## 2018-09-24 NOTE — Assessment & Plan Note (Signed)
Stable. Discussed HEP  Patient responds well to manipulation.  Discussed which activities to do which wants to avoid.  Discussed avoiding certain activities that seems to exacerbate it.  Follow-up again in 10 to 12 weeks

## 2018-09-24 NOTE — Assessment & Plan Note (Signed)
Decision today to treat with OMT was based on Physical Exam  After verbal consent patient was treated with HVLA, ME, FPR techniques in  thoracic, lumbar and sacral areas  Patient tolerated the procedure well with improvement in symptoms  Patient given exercises, stretches and lifestyle modifications  See medications in patient instructions if given  Patient will follow up in  6-8 weeks 

## 2018-10-04 ENCOUNTER — Other Ambulatory Visit: Payer: Self-pay | Admitting: Nurse Practitioner

## 2018-10-04 DIAGNOSIS — I10 Essential (primary) hypertension: Secondary | ICD-10-CM

## 2018-10-26 ENCOUNTER — Ambulatory Visit: Payer: Self-pay | Admitting: Internal Medicine

## 2018-12-03 ENCOUNTER — Ambulatory Visit: Payer: Self-pay | Admitting: Family Medicine

## 2020-02-09 IMAGING — CT CT HEAD W/O CM
3 series · 15 of 47 positions shown, 18 images · non-contrast
Comparison: No comparison studies available.

CLINICAL DATA: Confusion.

EXAM:
CT HEAD WITHOUT CONTRAST
TECHNIQUE: Contiguous axial images were obtained from the base of the skull
through the vertex without intravenous contrast.

[Series 2: head wo · axial · 0.47mm/px · z∈[-178,-53]mm · 9 of 30 slices shown, 12 images]
[im 3/30  brain]
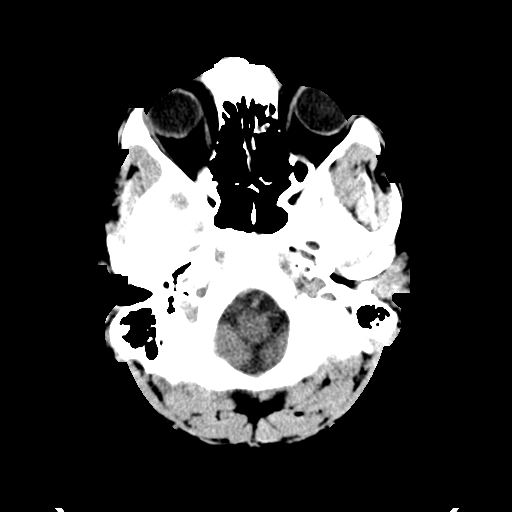
[im 3/30  bone]
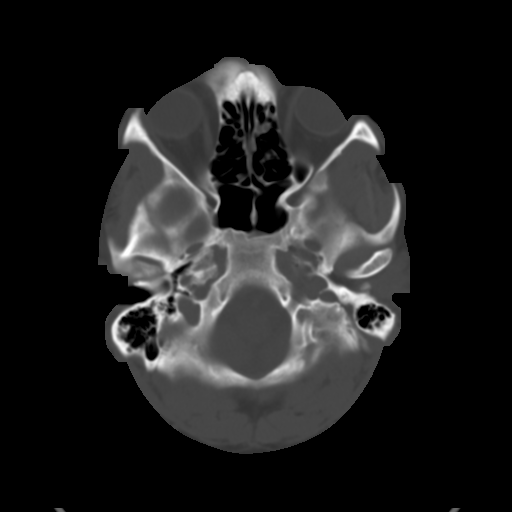
[im 6/30  brain]
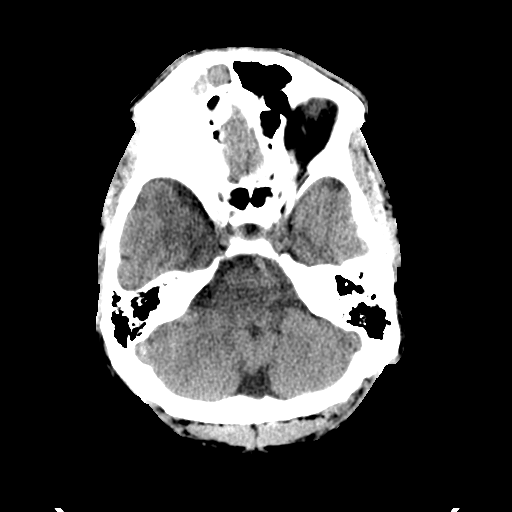
[im 9/30  brain]
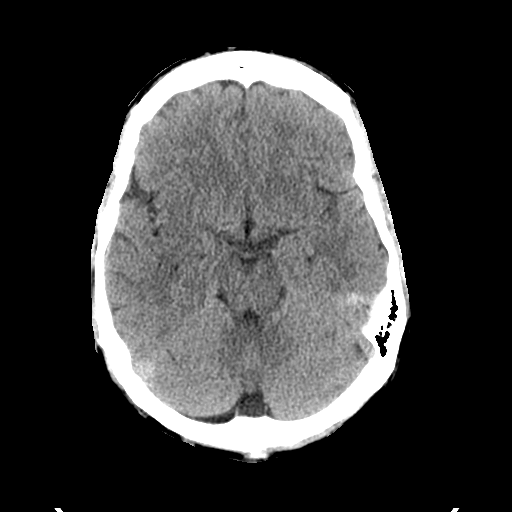
[im 12/30  brain]
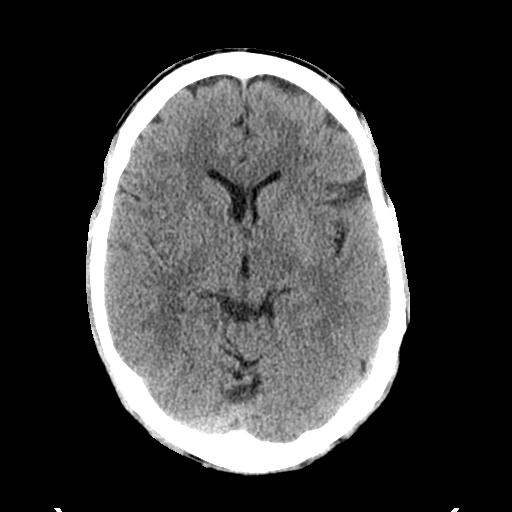
[im 16/30  brain]
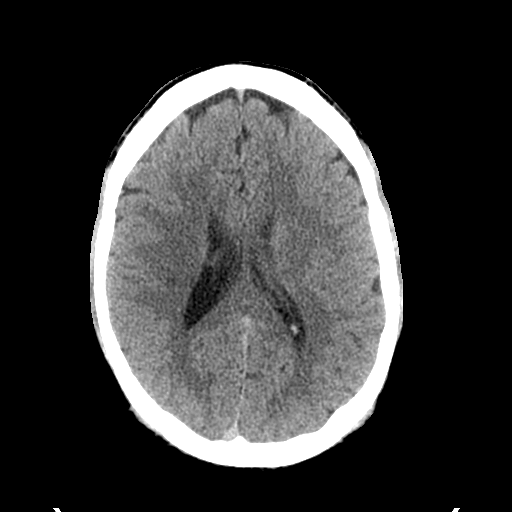
[im 16/30  bone]
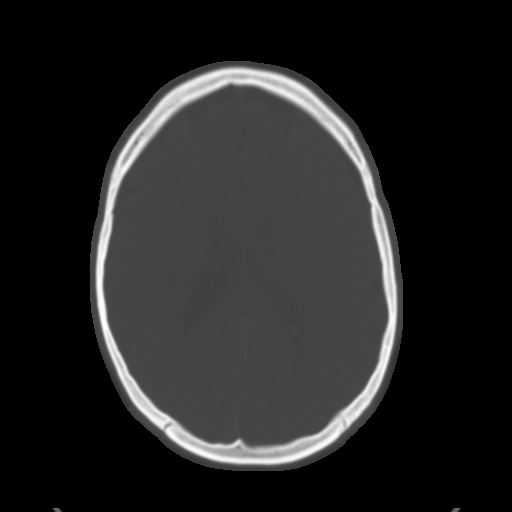
[im 19/30  brain]
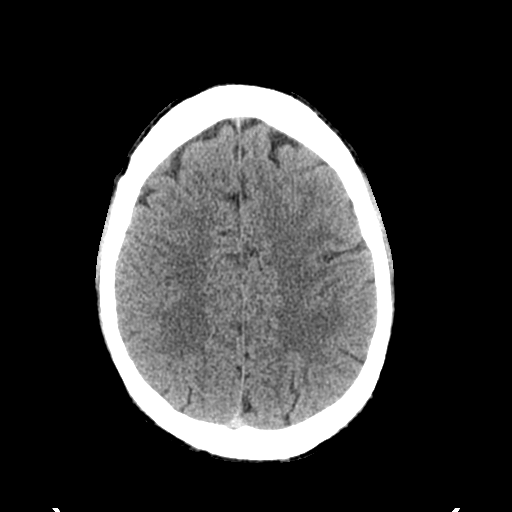
[im 22/30  brain]
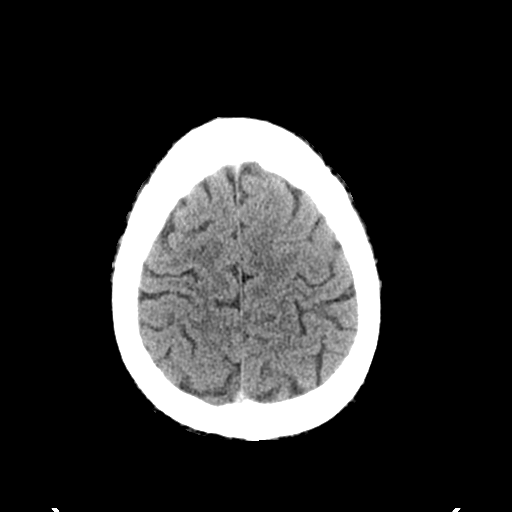
[im 25/30  brain]
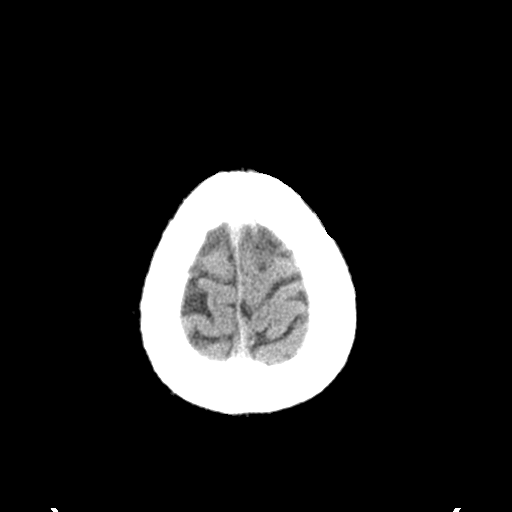
[im 28/30  brain]
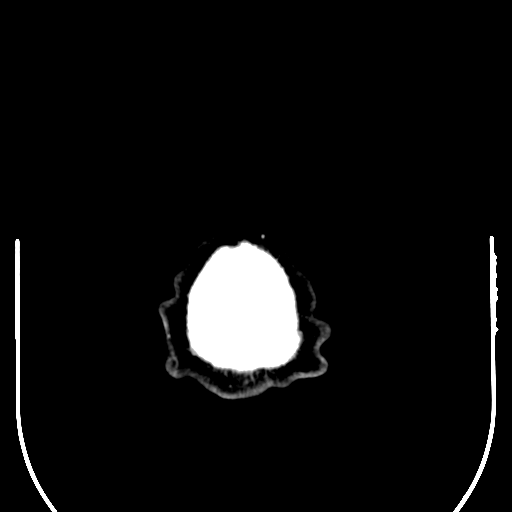
[im 28/30  bone]
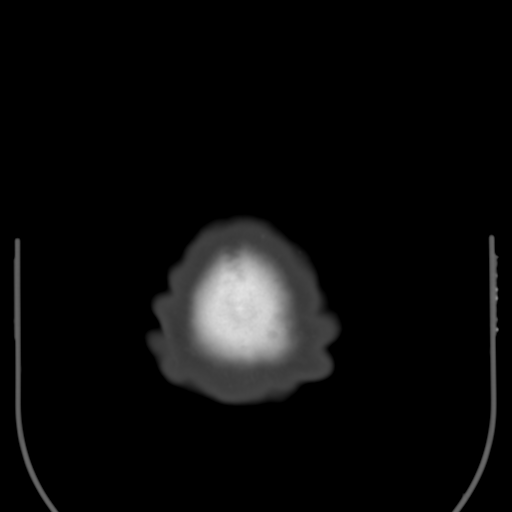

[Series 5: coronal soft tissue · coronal · 0.30mm/px · 3 of 74 slices shown]
[im 25/74  brain]
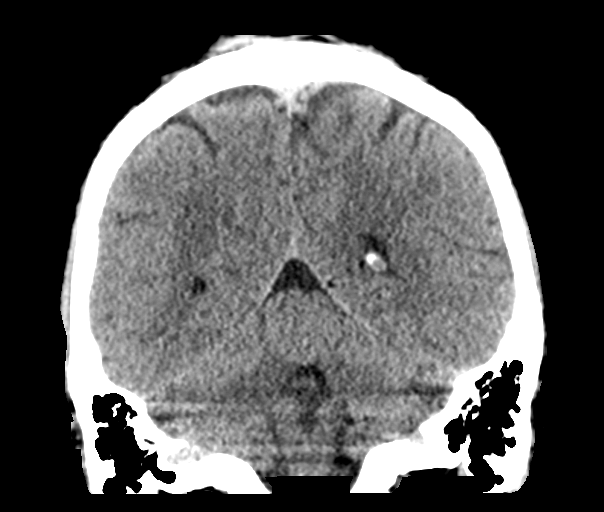
[im 33/74  brain]
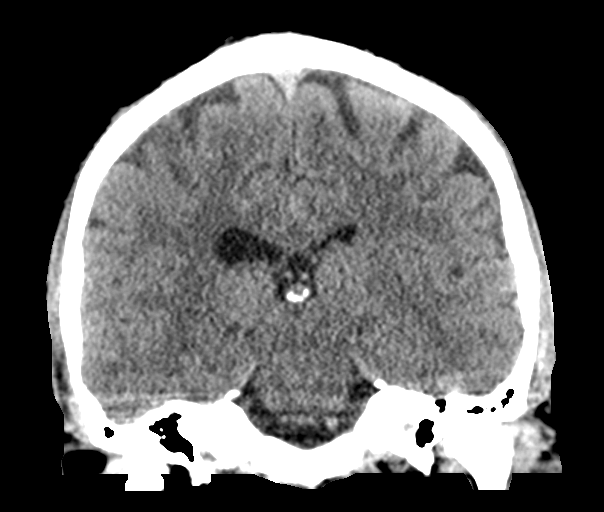
[im 41/74  brain]
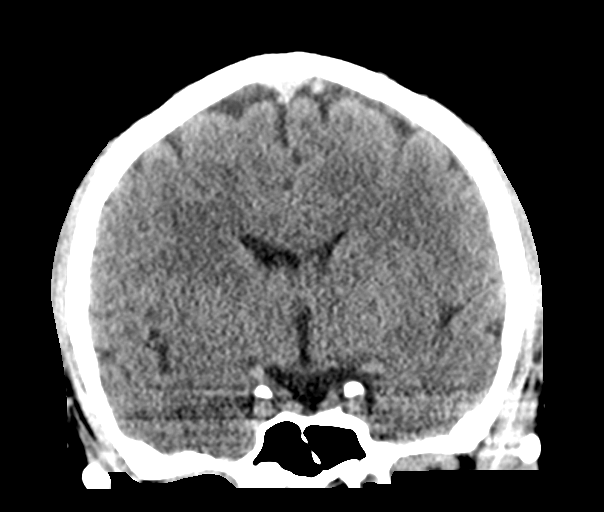

[Series 6: sagittal soft tissue · sagittal · 0.31mm/px · 3 of 61 slices shown]
[im 21/61  brain]
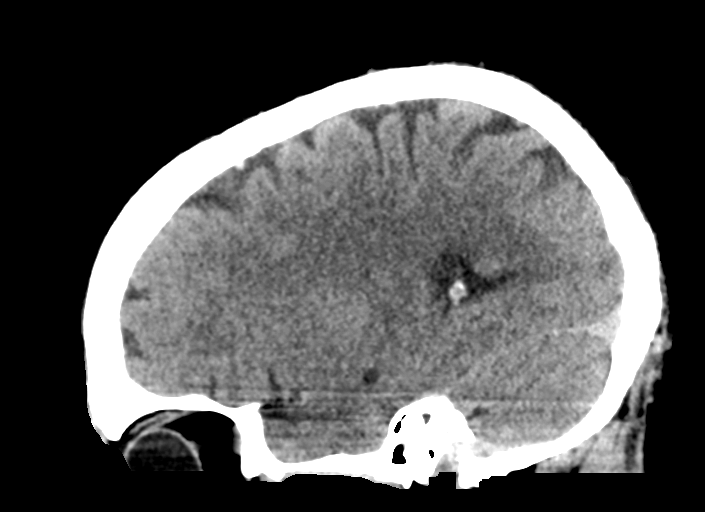
[im 31/61  brain]
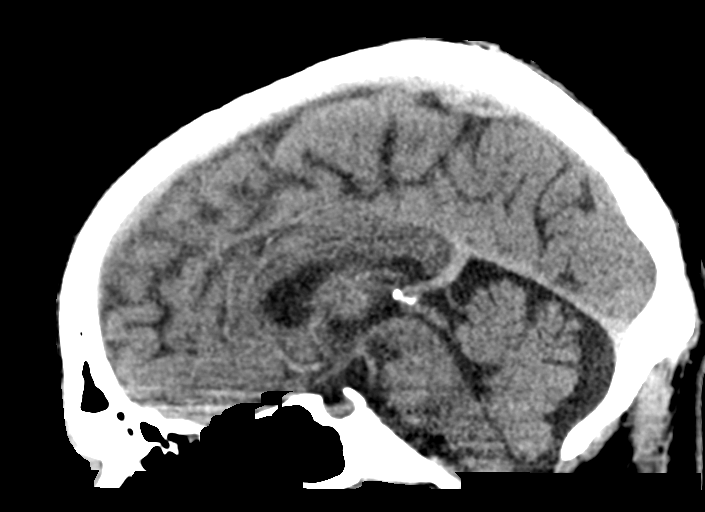
[im 41/61  brain]
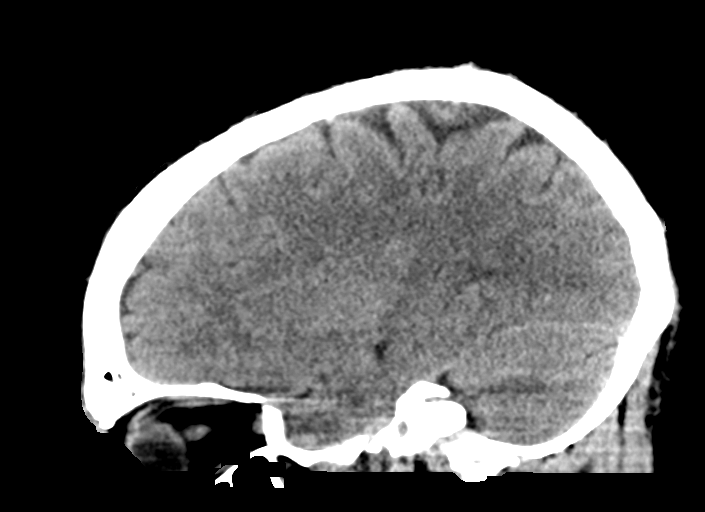

[15 of 47 positions shown; findings below may reference images not displayed]

FINDINGS: Brain: There is no evidence for acute hemorrhage, hydrocephalus,
mass lesion, or abnormal extra-axial fluid collection. No definite
CT evidence for acute infarction.

Vascular: No hyperdense vessel or unexpected calcification.

Skull: No evidence for fracture. No worrisome lytic or sclerotic
lesion.

Sinuses/Orbits: Chronic opacification noted right frontal sinus.
Remaining visualized paranasal sinuses and mastoid air cells are
clear. Visualized portions of the globes and intraorbital fat are
unremarkable.

Other: None.
IMPRESSION: Chronic opacification right frontal sinus. Otherwise unremarkable
exam.

## 2020-07-14 ENCOUNTER — Ambulatory Visit: Payer: BLUE CROSS/BLUE SHIELD | Attending: Internal Medicine

## 2020-07-14 DIAGNOSIS — Z23 Encounter for immunization: Secondary | ICD-10-CM

## 2020-07-14 NOTE — Progress Notes (Signed)
   Covid-[facility] Vaccination [facility]  Name:  Jacob [name]    MRN: [id number] DOB: 13-Dec-1985  07/14/2020  [name] was observed post Covid-19 immunization for 15 minutes without incident. He was provided with Vaccine Information Sheet and instruction to access the V-Safe system.   [name] was instructed to call 911 with any severe reactions post vaccine: Marland Kitchen Difficulty breathing  . Swelling of face and throat  . A fast heartbeat  . A bad rash all over body  . Dizziness and weakness   Immunizations Administered    Name Date Dose VIS Date Route   Moderna Covid-19 Booster Vaccine 07/14/2020  5:45 PM 0.25 mL 04/26/2020 Intramuscular   Manufacturer: Gala Murdoch   Lot: [id number]   NDC: 93734-287-68

## 2022-02-18 NOTE — Telephone Encounter (Signed)
Seems like encounter was open in error so closing encounter.  

## 2024-01-28 ENCOUNTER — Emergency Department (HOSPITAL_BASED_OUTPATIENT_CLINIC_OR_DEPARTMENT_OTHER)

## 2024-01-28 ENCOUNTER — Other Ambulatory Visit: Payer: Self-pay

## 2024-01-28 ENCOUNTER — Inpatient Hospital Stay (HOSPITAL_BASED_OUTPATIENT_CLINIC_OR_DEPARTMENT_OTHER)
Admission: EM | Admit: 2024-01-28 | Discharge: 2024-01-31 | DRG: 872 | Disposition: A | Discharge status: Home / Self Care | Attending: Internal Medicine | Admitting: Internal Medicine

## 2024-01-28 DIAGNOSIS — D75838 Other thrombocytosis: Secondary | ICD-10-CM | POA: Diagnosis not present

## 2024-01-28 DIAGNOSIS — N200 Calculus of kidney: Secondary | ICD-10-CM

## 2024-01-28 DIAGNOSIS — N211 Calculus in urethra: Secondary | ICD-10-CM | POA: Diagnosis present

## 2024-01-28 DIAGNOSIS — F909 Attention-deficit hyperactivity disorder, unspecified type: Secondary | ICD-10-CM | POA: Diagnosis present

## 2024-01-28 DIAGNOSIS — K760 Fatty (change of) liver, not elsewhere classified: Secondary | ICD-10-CM | POA: Diagnosis present

## 2024-01-28 DIAGNOSIS — L03119 Cellulitis of unspecified part of limb: Secondary | ICD-10-CM

## 2024-01-28 DIAGNOSIS — Z809 Family history of malignant neoplasm, unspecified: Secondary | ICD-10-CM

## 2024-01-28 DIAGNOSIS — Z888 Allergy status to other drugs, medicaments and biological substances status: Secondary | ICD-10-CM

## 2024-01-28 DIAGNOSIS — M130 Polyarthritis, unspecified: Secondary | ICD-10-CM | POA: Diagnosis present

## 2024-01-28 DIAGNOSIS — E872 Acidosis, unspecified: Secondary | ICD-10-CM | POA: Diagnosis present

## 2024-01-28 DIAGNOSIS — I10 Essential (primary) hypertension: Secondary | ICD-10-CM | POA: Diagnosis present

## 2024-01-28 DIAGNOSIS — F319 Bipolar disorder, unspecified: Secondary | ICD-10-CM | POA: Diagnosis present

## 2024-01-28 DIAGNOSIS — Z87891 Personal history of nicotine dependence: Secondary | ICD-10-CM

## 2024-01-28 DIAGNOSIS — L03116 Cellulitis of left lower limb: Secondary | ICD-10-CM | POA: Diagnosis present

## 2024-01-28 DIAGNOSIS — D696 Thrombocytopenia, unspecified: Secondary | ICD-10-CM | POA: Diagnosis present

## 2024-01-28 DIAGNOSIS — A419 Sepsis, unspecified organism: Principal | ICD-10-CM | POA: Diagnosis present

## 2024-01-28 DIAGNOSIS — Z7951 Long term (current) use of inhaled steroids: Secondary | ICD-10-CM

## 2024-01-28 DIAGNOSIS — R600 Localized edema: Secondary | ICD-10-CM | POA: Insufficient documentation

## 2024-01-28 DIAGNOSIS — R6520 Severe sepsis without septic shock: Secondary | ICD-10-CM | POA: Diagnosis present

## 2024-01-28 DIAGNOSIS — D75839 Thrombocytosis, unspecified: Secondary | ICD-10-CM | POA: Insufficient documentation

## 2024-01-28 DIAGNOSIS — Z79899 Other long term (current) drug therapy: Secondary | ICD-10-CM

## 2024-01-28 DIAGNOSIS — L03115 Cellulitis of right lower limb: Secondary | ICD-10-CM | POA: Diagnosis present

## 2024-01-28 DIAGNOSIS — Z1152 Encounter for screening for COVID-19: Secondary | ICD-10-CM

## 2024-01-28 LAB — COMPREHENSIVE METABOLIC PANEL WITH GFR
ALT: 23 U/L (ref 0–44)
AST: 16 U/L (ref 15–41)
Albumin: 4.2 g/dL (ref 3.5–5.0)
Alkaline Phosphatase: 55 U/L (ref 38–126)
Anion gap: 14 (ref 5–15)
BUN: 10 mg/dL (ref 6–20)
CO2: 24 mmol/L (ref 22–32)
Calcium: 10 mg/dL (ref 8.9–10.3)
Chloride: 102 mmol/L (ref 98–111)
Creatinine, Ser: 0.97 mg/dL (ref 0.61–1.24)
GFR, Estimated: >60 mL/min (ref >60)
Glucose, Bld: 96 mg/dL (ref 70–99)
Potassium: 3.8 mmol/L (ref 3.5–5.1)
Sodium: 140 mmol/L (ref 135–145)
Total Bilirubin: 0.6 mg/dL (ref 0.0–1.2)
Total Protein: 7.6 g/dL (ref 6.5–8.1)

## 2024-01-28 LAB — CBC WITH DIFFERENTIAL/PLATELET
Abs Immature Granulocytes: 0.14 K/uL — ABNORMAL HIGH (ref 0.00–0.07)
Basophils Absolute: 0.1 K/uL (ref 0.0–0.1)
Basophils Relative: 1 %
Eosinophils Absolute: 0.3 K/uL (ref 0.0–0.5)
Eosinophils Relative: 2 %
HCT: 40.2 % (ref 39.0–52.0)
Hemoglobin: 13.8 g/dL (ref 13.0–17.0)
Immature Granulocytes: 1 %
Lymphocytes Relative: 11 %
Lymphs Abs: 1.7 K/uL (ref 0.7–4.0)
MCH: 30.9 pg (ref 26.0–34.0)
MCHC: 34.3 g/dL (ref 30.0–36.0)
MCV: 90.1 fL (ref 80.0–100.0)
Monocytes Absolute: 1 K/uL (ref 0.1–1.0)
Monocytes Relative: 7 %
Neutro Abs: 11.8 K/uL — ABNORMAL HIGH (ref 1.7–7.7)
Neutrophils Relative %: 78 %
Platelets: 510 K/uL — ABNORMAL HIGH (ref 150–400)
RBC: 4.46 MIL/uL (ref 4.22–5.81)
RDW: 11.7 % (ref 11.5–15.5)
WBC: 15 K/uL — ABNORMAL HIGH (ref 4.0–10.5)
nRBC: 0 % (ref 0.0–0.2)

## 2024-01-28 LAB — LACTIC ACID, PLASMA: Lactic Acid, Venous: 2.3 mmol/L (ref 0.5–1.9)

## 2024-01-28 LAB — URINALYSIS, W/ REFLEX TO CULTURE (INFECTION SUSPECTED)
Bacteria, UA: NONE SEEN
Bilirubin Urine: NEGATIVE
Glucose, UA: NEGATIVE mg/dL
Hgb urine dipstick: NEGATIVE
Ketones, ur: NEGATIVE mg/dL
Leukocytes,Ua: NEGATIVE
Nitrite: NEGATIVE
Protein, ur: NEGATIVE mg/dL
Specific Gravity, Urine: 1.018 (ref 1.005–1.030)
pH: 6 (ref 5.0–8.0)

## 2024-01-28 MED ORDER — IOHEXOL 300 MG/ML  SOLN
100.0000 mL | Freq: Once | INTRAMUSCULAR | Status: AC | PRN
  Administered 2024-01-28: 100 mL via INTRAVENOUS

## 2024-01-28 MED ORDER — LACTATED RINGERS IV SOLN: INTRAVENOUS | Status: AC

## 2024-01-28 MED ORDER — ACETAMINOPHEN 500 MG PO TABS
1000.0000 mg | ORAL_TABLET | Freq: Once | ORAL | Status: AC
Start: 2024-01-28 — End: 2024-01-28
  Administered 2024-01-28: 1000 mg via ORAL
  Filled 2024-01-28: qty 2

## 2024-01-28 MED ORDER — LACTATED RINGERS IV BOLUS (SEPSIS)
1000.0000 mL | Freq: Once | INTRAVENOUS | Status: AC
  Administered 2024-01-28: 1000 mL via INTRAVENOUS

## 2024-01-28 MED ORDER — SODIUM CHLORIDE 0.9 % IV SOLN
2.0000 g | Freq: Once | INTRAVENOUS | Status: AC
  Administered 2024-01-28: 2 g via INTRAVENOUS
  Filled 2024-01-28: qty 20

## 2024-01-28 MED ORDER — LACTATED RINGERS IV BOLUS (SEPSIS)
500.0000 mL | Freq: Once | INTRAVENOUS | Status: AC
  Administered 2024-01-29: 500 mL via INTRAVENOUS

## 2024-01-28 NOTE — ED Provider Notes (Signed)
 [place] EMERGENCY DEPARTMENT AT [facility] Provider Note   CSN: [id number] Arrival date & time: 01/28/24  2021     Patient presents with: Leg Swelling (Bilateral )   Jacob [name] is a 38 y.o. male.  Patient with past history significant for bipolar disorder, hypertension presents emergency department concerns of leg swelling leg pain.  Reports that he has been treated for cellulitis with doxycycline  and Keflex.  Denies any significant improvement.  Reports concerns that his symptoms developed after he received a pedicure on July 4 with redness and swelling present to the bilateral lower legs.  Also states that he was recently being treated for a kidney stone and states that the stone is not passed out.  Denies any significant urinary discomfort.  No reported hematuria at this time.  HPI     Prior to Admission medications   Medication Sig Start Date End Date Taking? Authorizing Provider  albuterol  (PROVENTIL  HFA;VENTOLIN  HFA) 108 (90 Base) MCG/ACT inhaler Inhale 2 puffs into the lungs every 6 (six) hours as needed for wheezing or shortness of breath. 08/07/17   Kathlyne Romualdo Capers, NP  amoxicillin -clavulanate (AUGMENTIN ) 875-125 MG tablet Take 1 tablet by mouth 2 (two) times daily. 09/07/18   Plotnikov, Aleksei V, MD  b complex vitamins tablet Take 1 tablet by mouth daily.    [provider]  Diclofenac  Sodium (PENNSAID ) 2 % SOLN Place 2 g onto the skin 2 (two) times daily. 06/27/17   Claudene Arthea HERO, DO  Diclofenac  Sodium 2 % SOLN Place 2 g onto the skin 2 (two) times daily. 06/24/18   Claudene Arthea HERO, DO  fluticasone  furoate-vilanterol (BREO ELLIPTA ) 200-25 MCG/INH AEPB Inhale 1 puff into the lungs daily. 11/27/17   Geronimo Amel, MD  lisdexamfetamine (VYVANSE ) 70 MG capsule Take 1 capsule (70 mg total) by mouth daily. 12/31/17   Eksir, Marsa Jasper, MD  losartan  (COZAAR ) 100 MG tablet Take 1 tablet (100 mg total) by mouth daily. 09/07/18   Plotnikov,  Karlynn GAILS, MD    Allergies: Amlodipine     Review of Systems  Skin:  Positive for color change.  All other systems reviewed and are negative.   Updated Vital Signs BP (!) 146/88 (BP Location: Right Arm)   Pulse (!) 101   Temp 99.1 F (37.3 C) (Oral)   Resp (!) 21   Wt 111.1 kg   SpO2 98%   BMI 38.37 kg/m   Physical Exam Vitals and nursing note reviewed.  Constitutional:      General: He is not in acute distress.    Appearance: He is well-developed.  HENT:     Head: Normocephalic and atraumatic.  Eyes:     Conjunctiva/sclera: Conjunctivae normal.  Cardiovascular:     Rate and Rhythm: Normal rate and regular rhythm.     Heart sounds: No murmur heard. Pulmonary:     Effort: Pulmonary effort is normal. No respiratory distress.     Breath sounds: Normal breath sounds.  Abdominal:     Palpations: Abdomen is soft.     Tenderness: There is no abdominal tenderness. There is no right CVA tenderness or left CVA tenderness.  Genitourinary:    Comments: Firm mass towards the urethral meatus but no obvious stone or other finding seen. Musculoskeletal:        General: No swelling.     Cervical back: Neck supple.  Skin:    General: Skin is warm and dry.     Capillary Refill: Capillary refill takes less  than 2 seconds.     Findings: Erythema and rash present.  Neurological:     Mental Status: He is alert.  Psychiatric:        Mood and Affect: Mood normal.     (all labs ordered are listed, but only abnormal results are displayed) Labs Reviewed  LACTIC ACID, PLASMA - Abnormal; Notable for the following components:      Result Value   Lactic Acid, Venous 2.3 (*)    All other components within normal limits  CBC WITH DIFFERENTIAL/PLATELET - Abnormal; Notable for the following components:   WBC 15.0 (*)    Platelets 510 (*)    Neutro Abs 11.8 (*)    Abs Immature Granulocytes 0.14 (*)    All other components within normal limits  CULTURE, BLOOD (SINGLE)  CULTURE, BLOOD  (SINGLE)  RESP PANEL BY RT-PCR (RSV, FLU A&B, COVID)  RVPGX2  LACTIC ACID, PLASMA  COMPREHENSIVE METABOLIC PANEL WITH GFR  URINALYSIS, W/ REFLEX TO CULTURE (INFECTION SUSPECTED)    EKG: None  Radiology: CT ABDOMEN PELVIS W CONTRAST Addendum Date: 01/28/2024 ADDENDUM #1 ADDENDUM: An additional review, there is a 5 mm calculus in the glans penis, within the distal penile urethra at the urethral meatus, likely within the fossa navicularis. ---------------------------------------------------- Electronically signed by: Pinkie Pebbles MD 01/28/2024 11:17 PM EDT RP Workstation: [id number]   Result Date: 01/28/2024 ORIGINAL REPORT EXAM: CT ABDOMEN AND PELVIS WITH CONTRAST 01/28/2024 09:49:26 PM TECHNIQUE: CT of the abdomen and pelvis was performed with the administration of intravenous contrast. Multiplanar reformatted images are provided for review. Automated exposure control, iterative reconstruction, and/or weight based adjustment of the mA/kV was utilized to reduce the radiation dose to as low as reasonably achievable. CONTRAST: 100mL iohexol  (OMNIPAQUE ) 300 MG/ML solution COMPARISON: None available. CLINICAL HISTORY: Sepsis. Table formatting from the original note was not included. Pt reports concern for bilateral leg swelling and redness. S/s noted after a pedicure on July 4th. Redness and swelling since had spread into the shin. Denies a hx of DVT. Was seen [facility] clinic on Sunday 13th - prescribed abx cephalexin 500 mg - has since completed course of abx. Was seen again at [facility] on Friday d/t ongoing redness and warmth. Started taking doxycycline  with some improvement. Redness, swelling, and warmth worsened again on Monday. Now feeling aches, pain, and chills. Notable VS in triage HR 137 and T 100.4. Last took ibuprofen  at 2 pm. FINDINGS: LOWER CHEST: No acute abnormality. LIVER: Mild hepatic steatosis. GALLBLADDER AND BILE DUCTS: Gallbladder is unremarkable. No biliary ductal  dilatation. SPLEEN: No acute abnormality. PANCREAS: No acute abnormality. ADRENAL GLANDS: No acute abnormality. KIDNEYS, URETERS AND BLADDER: No stones in the kidneys or ureters. No hydronephrosis. No perinephric or periureteral stranding. Urinary bladder is unremarkable. GI AND BOWEL: Stomach demonstrates no acute abnormality. There is no bowel obstruction. No bowel wall thickening. Normal appendix (image 67). PERITONEUM AND RETROPERITONEUM: No ascites. No free air. VASCULATURE: Aorta is normal in caliber. LYMPH NODES: No lymphadenopathy. REPRODUCTIVE ORGANS: Prostate is unremarkable. BONES AND SOFT TISSUES: No acute osseous abnormality. Tiny fat-containing left inguinal hernia. IMPRESSION: 1. No acute findings in the abdomen or pelvis. 2. Mild hepatic steatosis. Electronically signed by: Pinkie Pebbles MD 01/28/2024 09:52 PM EDT RP Workstation: [id number]     Procedures   Medications Ordered in the ED  lactated ringers  infusion ( Intravenous New Bag/Given 01/29/24 0039)  lactated ringers  bolus 1,000 mL (0 mLs Intravenous Stopped 01/29/24 0038)    And  lactated ringers  bolus  1,000 mL (0 mLs Intravenous Stopped 01/29/24 0038)    And  lactated ringers  bolus 1,000 mL (0 mLs Intravenous Stopped 01/29/24 0039)    And  lactated ringers  bolus 500 mL (0 mLs Intravenous Stopped 01/29/24 0038)  cefTRIAXone  (ROCEPHIN ) 2 g in sodium chloride  0.9 % 100 mL IVPB (0 g Intravenous Stopped 01/28/24 2250)  acetaminophen  (TYLENOL ) tablet 1,000 mg (1,000 mg Oral Given 01/28/24 2146)  iohexol  (OMNIPAQUE ) 300 MG/ML solution 100 mL (100 mLs Intravenous Contrast Given 01/28/24 2142)                                   Medical Decision Making Amount and/or Complexity of Data Reviewed Labs: ordered. Radiology: ordered.  Risk OTC drugs. Prescription drug management. Decision regarding hospitalization.   This patient presents to the ED for concern of leg swelling, this involves an extensive number of treatment options,  and is a complaint that carries with it a high risk of complications and morbidity.  The differential diagnosis includes cellulitis, DVT, lymphedema, insect bite, sepsis   Co morbidities that complicate the patient evaluation  Bipolar disorder, hypertension   Lab Tests:  I Ordered, and personally interpreted labs.  The pertinent results include: CBC shows mild leukocytosis at 15.0, lactic acid elevated 2.3, CMP unremarkable, UA without signs of infection, blood cultures collected and pending   Imaging Studies ordered:  I ordered imaging studies including CT abdomen pelvis I independently visualized and interpreted imaging which showed no acute findings in the abdomen or pelvis I agree with the radiologist interpretation   Cardiac Monitoring: / EKG:  The patient was maintained on a cardiac monitor.  I personally viewed and interpreted the cardiac monitored which showed an underlying rhythm of: Sinus tachycardia   Consultations Obtained:  I requested consultation with the hospitalist,  and discussed lab and imaging findings as well as pertinent plan - they recommend: Hospitalist consult pending at time of admission.   Problem List / ED Course / Critical interventions / Medication management  Patient past history significant for bipolar disorder and hypertension presents to the emergency department concerns of leg swelling.  He reports that he has been treated with doxycycline  and Keflex for this cellulitis on his legs but reports that these symptoms are not improving.  Denies any drainage at any time.  States that he believes that he may have contracted some sort of infection after receiving a pedicure on July 4.  States that there is redness and swelling starting soon after that.  Denies any other symptoms although does report that he was recently treated for a kidney stone has been taking Flomax .  Denies any hematuria, dysuria, increased urinary frequency or urgency.  No reported  flank pain. On exam, patient does have cellulitic findings on bilateral lower extremities.  The right leg is worse appearing than the left.  There is no calf tenderness.  No pitting edema.  No CVA tenderness or abdominal tenderness.  No abnormal heart or lung sounds.  Given patient's fever, tachycardia, sepsis labs ordered. Sepsis not confirmed as patient's white count is at 15.0 and lactic acid low at 2.3.  Initiated fluid bolus as well as antibiotic coverage with Rocephin  for cellulitic legs.  Given recent kidney stone, will scan the abdomen pelvis for any signs of pyelonephritis or other source of infection. Patient CT abdomen pelvis is negative.  No significant change regarding his tachycardia.  At this point, sepsis secondary  to cellulitis is the only explanation that we have found.  Will consult hospitalist for admission.  Patient is agreeable with this plan. I ordered medication including fluids, Rocephin , Tylenol  for dehydration, cellulitis, fever Reevaluation of the patient after these medicines showed that the patient improved I have reviewed the patients home medicines and have made adjustments as needed  12:46 AM Care of Jacob [name] transferred to Dr. Trine at the end of my shift as the patient will require reassessment once labs/imaging have resulted. Patient presentation, ED course, and plan of care discussed with review of all pertinent labs and imaging. Please see his/her note for further details regarding further ED course and disposition. Plan at time of handoff is consult to hospitalist for admission. This may be altered or completely changed at the discretion of the oncoming team pending results of further workup.    Test / Admission - Considered:  Patient requiring inpatient admission.  Final diagnoses:  Sepsis without acute organ dysfunction, due to unspecified organism [facility])  Cellulitis of lower extremity, unspecified laterality  Urethral stone    ED Discharge Orders      None          Cecily Legrand LABOR, PA-C 01/29/24 0046    Horton, Roxie HERO, DO 02/06/24 [phone]

## 2024-01-28 NOTE — ED Triage Notes (Signed)
 Pt reports concern for bilateral leg swelling and redness. S/s noted after a pedicure on July 4th. Redness and swelling since had spread into the shin. Denies a hx of DVT.  Was seen [facility] clinic on Sunday 13th - prescribed abx cephalexin 500 mg - has since completed course of abx. Was seen again at [facility] clinic on Friday d/t ongoing redness and warmth. Started taking doxycycline with some improvement. Redness, swelling, and warmth worsened again on Monday. Now feeling aches, pain, and chills.  Notable VS in triage HR 137 and T 100.4. Last took ibuprofen  at 2 pm.

## 2024-01-28 NOTE — Sepsis Progress Note (Signed)
 Elink monitoring for the code sepsis protocol.

## 2024-01-29 ENCOUNTER — Inpatient Hospital Stay (HOSPITAL_COMMUNITY)

## 2024-01-29 ENCOUNTER — Encounter (HOSPITAL_COMMUNITY): Payer: Self-pay | Admitting: Family Medicine

## 2024-01-29 DIAGNOSIS — D696 Thrombocytopenia, unspecified: Secondary | ICD-10-CM | POA: Diagnosis present

## 2024-01-29 DIAGNOSIS — D75838 Other thrombocytosis: Secondary | ICD-10-CM | POA: Diagnosis not present

## 2024-01-29 DIAGNOSIS — Z888 Allergy status to other drugs, medicaments and biological substances status: Secondary | ICD-10-CM | POA: Diagnosis not present

## 2024-01-29 DIAGNOSIS — I10 Essential (primary) hypertension: Secondary | ICD-10-CM | POA: Diagnosis present

## 2024-01-29 DIAGNOSIS — L03115 Cellulitis of right lower limb: Secondary | ICD-10-CM | POA: Diagnosis present

## 2024-01-29 DIAGNOSIS — N200 Calculus of kidney: Secondary | ICD-10-CM

## 2024-01-29 DIAGNOSIS — L03116 Cellulitis of left lower limb: Secondary | ICD-10-CM | POA: Diagnosis present

## 2024-01-29 DIAGNOSIS — R6520 Severe sepsis without septic shock: Secondary | ICD-10-CM

## 2024-01-29 DIAGNOSIS — A419 Sepsis, unspecified organism: Secondary | ICD-10-CM | POA: Diagnosis present

## 2024-01-29 DIAGNOSIS — Z7951 Long term (current) use of inhaled steroids: Secondary | ICD-10-CM | POA: Diagnosis not present

## 2024-01-29 DIAGNOSIS — M7989 Other specified soft tissue disorders: Secondary | ICD-10-CM | POA: Diagnosis not present

## 2024-01-29 DIAGNOSIS — R600 Localized edema: Secondary | ICD-10-CM | POA: Diagnosis not present

## 2024-01-29 DIAGNOSIS — F319 Bipolar disorder, unspecified: Secondary | ICD-10-CM | POA: Diagnosis present

## 2024-01-29 DIAGNOSIS — L03119 Cellulitis of unspecified part of limb: Secondary | ICD-10-CM | POA: Diagnosis not present

## 2024-01-29 DIAGNOSIS — Z87891 Personal history of nicotine dependence: Secondary | ICD-10-CM | POA: Diagnosis not present

## 2024-01-29 DIAGNOSIS — E872 Acidosis, unspecified: Secondary | ICD-10-CM | POA: Diagnosis present

## 2024-01-29 DIAGNOSIS — D75839 Thrombocytosis, unspecified: Secondary | ICD-10-CM | POA: Diagnosis not present

## 2024-01-29 DIAGNOSIS — Z79899 Other long term (current) drug therapy: Secondary | ICD-10-CM | POA: Diagnosis not present

## 2024-01-29 DIAGNOSIS — K760 Fatty (change of) liver, not elsewhere classified: Secondary | ICD-10-CM | POA: Diagnosis present

## 2024-01-29 DIAGNOSIS — N211 Calculus in urethra: Secondary | ICD-10-CM | POA: Diagnosis present

## 2024-01-29 DIAGNOSIS — M130 Polyarthritis, unspecified: Secondary | ICD-10-CM | POA: Diagnosis present

## 2024-01-29 DIAGNOSIS — Z809 Family history of malignant neoplasm, unspecified: Secondary | ICD-10-CM | POA: Diagnosis not present

## 2024-01-29 DIAGNOSIS — Z1152 Encounter for screening for COVID-19: Secondary | ICD-10-CM | POA: Diagnosis not present

## 2024-01-29 DIAGNOSIS — F909 Attention-deficit hyperactivity disorder, unspecified type: Secondary | ICD-10-CM | POA: Diagnosis present

## 2024-01-29 LAB — CBC
HCT: 37.3 % — ABNORMAL LOW (ref 39.0–52.0)
Hemoglobin: 12.2 g/dL — ABNORMAL LOW (ref 13.0–17.0)
MCH: 30.3 pg (ref 26.0–34.0)
MCHC: 32.7 g/dL (ref 30.0–36.0)
MCV: 92.6 fL (ref 80.0–100.0)
Platelets: 416 K/uL — ABNORMAL HIGH (ref 150–400)
RBC: 4.03 MIL/uL — ABNORMAL LOW (ref 4.22–5.81)
RDW: 11.8 % (ref 11.5–15.5)
WBC: 10.1 K/uL (ref 4.0–10.5)
nRBC: 0 % (ref 0.0–0.2)

## 2024-01-29 LAB — COMPREHENSIVE METABOLIC PANEL WITH GFR
ALT: 19 U/L (ref 0–44)
AST: 13 U/L — ABNORMAL LOW (ref 15–41)
Albumin: 2.9 g/dL — ABNORMAL LOW (ref 3.5–5.0)
Alkaline Phosphatase: 37 U/L — ABNORMAL LOW (ref 38–126)
Anion gap: 8 (ref 5–15)
BUN: 8 mg/dL (ref 6–20)
CO2: 23 mmol/L (ref 22–32)
Calcium: 8.5 mg/dL — ABNORMAL LOW (ref 8.9–10.3)
Chloride: 108 mmol/L (ref 98–111)
Creatinine, Ser: 0.52 mg/dL — ABNORMAL LOW (ref 0.61–1.24)
GFR, Estimated: >60 mL/min (ref >60)
Glucose, Bld: 116 mg/dL — ABNORMAL HIGH (ref 70–99)
Potassium: 3.6 mmol/L (ref 3.5–5.1)
Sodium: 139 mmol/L (ref 135–145)
Total Bilirubin: 1.2 mg/dL (ref 0.0–1.2)
Total Protein: 6.3 g/dL — ABNORMAL LOW (ref 6.5–8.1)

## 2024-01-29 LAB — RESP PANEL BY RT-PCR (RSV, FLU A&B, COVID)  RVPGX2
Influenza A by PCR: NEGATIVE
Influenza B by PCR: NEGATIVE
Resp Syncytial Virus by PCR: NEGATIVE
SARS Coronavirus 2 by RT PCR: NEGATIVE

## 2024-01-29 LAB — URIC ACID: Uric Acid, Serum: 5.1 mg/dL (ref 3.7–8.6)

## 2024-01-29 LAB — SEDIMENTATION RATE: Sed Rate: 55 mm/h — ABNORMAL HIGH (ref 0–16)

## 2024-01-29 LAB — TSH: TSH: 1.332 u[IU]/mL (ref 0.350–4.500)

## 2024-01-29 LAB — HEMOGLOBIN A1C
Hgb A1c MFr Bld: 5.3 % (ref 4.8–5.6)
Mean Plasma Glucose: 105.41 mg/dL

## 2024-01-29 LAB — MRSA NEXT GEN BY PCR, NASAL: MRSA by PCR Next Gen: NOT DETECTED

## 2024-01-29 LAB — LACTIC ACID, PLASMA: Lactic Acid, Venous: 1.1 mmol/L (ref 0.5–1.9)

## 2024-01-29 LAB — C-REACTIVE PROTEIN: CRP: 16.4 mg/dL — ABNORMAL HIGH (ref <1.0)

## 2024-01-29 LAB — HIV ANTIBODY (ROUTINE TESTING W REFLEX): HIV Screen 4th Generation wRfx: NONREACTIVE

## 2024-01-29 LAB — PROCALCITONIN: Procalcitonin: <0.1 ng/mL

## 2024-01-29 MED ORDER — ACETAMINOPHEN 325 MG PO TABS
650.0000 mg | ORAL_TABLET | Freq: Four times a day (QID) | ORAL | Status: DC | PRN
Start: 2024-01-29 — End: 2024-01-31

## 2024-01-29 MED ORDER — LACTATED RINGERS IV SOLN: INTRAVENOUS | Status: DC

## 2024-01-29 MED ORDER — SODIUM CHLORIDE 0.9 % IV SOLN: 2.0000 g | Freq: Three times a day (TID) | INTRAVENOUS | Status: DC

## 2024-01-29 MED ORDER — VANCOMYCIN HCL 1250 MG/250ML IV SOLN
1250.0000 mg | Freq: Two times a day (BID) | INTRAVENOUS | Status: DC
  Administered 2024-01-30 (×3): 1250 mg via INTRAVENOUS
  Filled 2024-01-29 (×3): qty 250

## 2024-01-29 MED ORDER — ALBUTEROL SULFATE (2.5 MG/3ML) 0.083% IN NEBU: 2.5000 mg | INHALATION_SOLUTION | RESPIRATORY_TRACT | Status: DC | PRN

## 2024-01-29 MED ORDER — SODIUM CHLORIDE 0.9 % IV SOLN
2.0000 g | INTRAVENOUS | Status: DC
  Administered 2024-01-29 – 2024-01-30 (×2): 2 g via INTRAVENOUS
  Filled 2024-01-29 (×2): qty 20

## 2024-01-29 MED ORDER — ONDANSETRON HCL 4 MG PO TABS
4.0000 mg | ORAL_TABLET | Freq: Four times a day (QID) | ORAL | Status: DC | PRN
Start: 2024-01-29 — End: 2024-01-31

## 2024-01-29 MED ORDER — TAMSULOSIN HCL 0.4 MG PO CAPS
0.4000 mg | ORAL_CAPSULE | Freq: Once | ORAL | Status: AC
  Administered 2024-01-29: 0.4 mg via ORAL
  Filled 2024-01-29: qty 1

## 2024-01-29 MED ORDER — KETOROLAC TROMETHAMINE 15 MG/ML IJ SOLN
15.0000 mg | Freq: Four times a day (QID) | INTRAMUSCULAR | Status: DC | PRN
  Administered 2024-01-29: 15 mg via INTRAVENOUS
  Filled 2024-01-29: qty 1

## 2024-01-29 MED ORDER — ACETAMINOPHEN 650 MG RE SUPP: 650.0000 mg | Freq: Four times a day (QID) | RECTAL | Status: DC | PRN

## 2024-01-29 MED ORDER — ENOXAPARIN SODIUM 60 MG/0.6ML IJ SOSY
55.0000 mg | PREFILLED_SYRINGE | INTRAMUSCULAR | Status: DC
  Administered 2024-01-29 – 2024-01-30 (×2): 55 mg via SUBCUTANEOUS
  Filled 2024-01-29 (×3): qty 0.6

## 2024-01-29 MED ORDER — TAMSULOSIN HCL 0.4 MG PO CAPS
0.4000 mg | ORAL_CAPSULE | Freq: Every day | ORAL | Status: DC
  Administered 2024-01-29: 0.4 mg via ORAL
  Filled 2024-01-29: qty 1

## 2024-01-29 MED ORDER — VANCOMYCIN HCL 2000 MG/400ML IV SOLN
2000.0000 mg | Freq: Once | INTRAVENOUS | Status: AC
  Administered 2024-01-29: 2000 mg via INTRAVENOUS
  Filled 2024-01-29: qty 400

## 2024-01-29 MED ORDER — POLYETHYLENE GLYCOL 3350 17 G PO PACK: 17.0000 g | PACK | Freq: Every day | ORAL | Status: DC | PRN

## 2024-01-29 MED ORDER — MELATONIN 5 MG PO TABS: 5.0000 mg | ORAL_TABLET | Freq: Every evening | ORAL | Status: DC | PRN

## 2024-01-29 MED ORDER — ONDANSETRON HCL 4 MG/2ML IJ SOLN: 4.0000 mg | Freq: Four times a day (QID) | INTRAMUSCULAR | Status: DC | PRN

## 2024-01-29 MED ORDER — TAMSULOSIN HCL 0.4 MG PO CAPS
0.8000 mg | ORAL_CAPSULE | Freq: Every day | ORAL | Status: DC
  Administered 2024-01-30 – 2024-01-31 (×2): 0.8 mg via ORAL
  Filled 2024-01-29 (×2): qty 2

## 2024-01-29 MED ORDER — NAPROXEN 250 MG PO TABS
500.0000 mg | ORAL_TABLET | Freq: Two times a day (BID) | ORAL | Status: DC
  Administered 2024-01-29 – 2024-01-30 (×2): 500 mg via ORAL
  Filled 2024-01-29 (×2): qty 2

## 2024-01-29 NOTE — ED Notes (Addendum)
 Recalled carelink

## 2024-01-29 NOTE — Progress Notes (Signed)
 Plan of Care Note for accepted transfer   Patient: OLIVIER FRAYRE MRN: [id number]   DOA: 01/28/2024  Facility requesting transfer: [facility]   Requesting Provider: Dr. Trine   Reason for transfer: Cellulitis   Facility course: 38 yr old male with HTN, asthma, and bipolar disorder presents with worsening lower leg pain, swelling, and redness despite cephalexin and doxycycline.   He is febrile and tachycardic with leukocytosis and lactate 2.3.   Blood cultures were collected and he was given IVF and antibiotics.   Plan of care: The patient is accepted for admission to Med-surg  unit, at [facility].   Author: Evalene GORMAN Sprinkles, MD 01/29/2024  Check [email or web address] for on-call coverage.  Nursing staff, Please call [facility] Admits & Consults System-Wide number on Amion as soon as patient's arrival, so appropriate admitting provider can evaluate the pt.

## 2024-01-29 NOTE — Progress Notes (Signed)
 BLE venous duplex has been completed.   Results can be found under chart review under CV PROC. 01/29/2024 11:45 AM Andriy Sherk RVT, RDMS

## 2024-01-29 NOTE — Plan of Care (Signed)

## 2024-01-29 NOTE — Plan of Care (Signed)

## 2024-01-29 NOTE — Plan of Care (Signed)
 Urology consulted to speak to 5 mm calculus noted in the fossa navicularis.  This is not obstructive and patient does not report having difficulty urinating.  He is likely to pass this on his own and if he does not within the week he will contact [facility] for clinic removal.  Please call with questions  [facility] [address] floor [place], [place]  [address] [phone]

## 2024-01-29 NOTE — ED Notes (Signed)
[facility] Link called 

## 2024-01-29 NOTE — Progress Notes (Signed)
 Pharmacy Antibiotic Note  Jacob [name] is a 38 y.o. male admitted on 01/28/2024 with cellulitis.  Pharmacy has been consulted for vancomycin  and cefepime dosing.  Plan: Cefepime 2 gr IV q8h  Vancomycin  2000 mg IV x 1 then vancomycin  1250 mg IV q12h ( AUC 464, Vd, 0.5, wt 111.5 kg)  Monitor clinical course, renal function, cultures as available    Height: 5' 11 (180.3 cm) Weight: 111.5 kg (245 lb 13 oz) IBW/kg (Calculated) : 75.3  Temp (24hrs), Avg:99 F (37.2 C), Min:98.1 F (36.7 C), Max:100.4 F (38 C)  Recent Labs  Lab 01/28/24 2050 01/29/24 0005  WBC 15.0*  --   CREATININE 0.97  --   LATICACIDVEN 2.3* 1.1    Estimated Creatinine Clearance: 132.4 mL/min (by C-G formula based on SCr of 0.97 mg/dL).    Allergies  Allergen Reactions   Amlodipine  Other (See Comments)    Head felt foggy    Antimicrobials this admission: 7/23 ceftriaxone  x 1  7/24 cefepime >>  7/24 vancomycin  >>   Dose adjustments this admission:   Microbiology results: 7/23 BCx:  7/24 Resp panel:  7/24 MRSA PCR   Dolphus Roller, PharmD, BCPS 01/29/2024 7:51 AM

## 2024-01-29 NOTE — H&P (Addendum)
 History and Physical    Jacob [name] FMW:[id number] DOB: 1985-12-04 DOA: 01/28/2024  DOS: the patient was seen and examined on 01/28/2024  PCP: Plotnikov, Karlynn GAILS, MD   Patient coming from: Home  I have personally briefly reviewed patient's old medical records in [facility] Link and CareEverywhere  HPI:   Jacob [name] is a 38 y.o. year old male with past medical history of hypertension, bipolar disorder, ADHD, and asthma not on any outpatient maintenance medications. He presented to [facility] ED yesterday evening with reports of bilateral lower extremity swelling, erythema, and pain. Patient believes his symptoms began after a pedicure on July 4th. He was seen at [facility] walk in clinic on 01/18/24 where he was prescribed a course of Keflex that he completed however he reports that the swelling and erythema continued to worsen so he re-presented to [facility] in [facility] on 01/23/24 and was prescribed Doxycycline in which he noticed minimal improvement in his symptoms.  Also of note he was being treated for a kidney stone that he has not yet passed, on imaging the stone is noted to be in the distal urethra.  He denies any associated discomfort, dysuria, hematuria, urinary frequency, or urinary urgency.  ED Course: On arrival to [facility] patient was noted to be febrile temp 38 C, BP 137/96, HR 137, RR 17, SPO2 99% on room air.  Labs notable for a lactic of 2.3-->1.1, leukocytosis WBC 15, platelets 510.   Blood cultures drawn and currently in process. CT abdomen pelvis obtained and shows a 5 mm stone within the distal penile urethra and mild hepatic steatosis noted.  He was given Tylenol , Rocephin , 3.5 L of LR and started on LR at 150 mL/h. TRH contacted for admission.  Review of Systems: As mentioned in the history of present illness. All other systems reviewed and are negative.  Review of Systems  Constitutional:  Positive for fever. Negative for chills and malaise/fatigue.   Genitourinary:  Negative for dysuria, flank pain, frequency, hematuria and urgency.  Skin:        Swelling and erythema of bilateral lower extremities  All other systems reviewed and are negative.   Past Medical History:  Diagnosis Date   ADHD (attention deficit hyperactivity disorder) evaluation    Allergy    Bipolar disorder (HCC)     History reviewed. No pertinent surgical history.   reports that he has been smoking cigarettes. He started smoking about 22 years ago. He has a 4.5 pack-year smoking history. He has never used smokeless tobacco. He reports that he does not drink alcohol and does not use drugs.  Allergies  Allergen Reactions   Amlodipine  Other (See Comments)    Head felt foggy    Family History  Problem Relation Age of Onset   Cancer Mother     Prior to Admission medications   Medication Sig Start Date End Date Taking? Authorizing Provider  albuterol  (PROVENTIL  HFA;VENTOLIN  HFA) 108 (90 Base) MCG/ACT inhaler Inhale 2 puffs into the lungs every 6 (six) hours as needed for wheezing or shortness of breath. 08/07/17   Kathlyne Romualdo Capers, NP  amoxicillin -clavulanate (AUGMENTIN ) 875-125 MG tablet Take 1 tablet by mouth 2 (two) times daily. 09/07/18   Plotnikov, Aleksei V, MD  b complex vitamins tablet Take 1 tablet by mouth daily.    [provider]  Diclofenac  Sodium (PENNSAID ) 2 % SOLN Place 2 g onto the skin 2 (two) times daily. 06/27/17   Smith, Zachary M, DO  Diclofenac  Sodium 2 % SOLN Place 2 g onto the skin 2 (two) times daily. 06/24/18   Claudene Arthea HERO, DO  fluticasone  furoate-vilanterol (BREO ELLIPTA ) 200-25 MCG/INH AEPB Inhale 1 puff into the lungs daily. 11/27/17   Geronimo Amel, MD  lisdexamfetamine (VYVANSE ) 70 MG capsule Take 1 capsule (70 mg total) by mouth daily. 12/31/17   Leila Marsa Jasper, MD  losartan  (COZAAR ) 100 MG tablet Take 1 tablet (100 mg total) by mouth daily. 09/07/18   Plotnikov, Karlynn GAILS, MD    Physical  Exam: Vitals:   01/29/24 0000 01/29/24 0217 01/29/24 0226 01/29/24 0611  BP: (!) 146/88 132/88  131/80  Pulse: (!) 101 95  87  Resp: (!) 21 16  16   Temp: 99.1 F (37.3 C) 98.4 F (36.9 C)  98.1 F (36.7 C)  TempSrc: Oral Oral  Oral  SpO2: 98% 97%  98%  Weight:   111.5 kg   Height:   5' 11 (1.803 m)     Physical Exam Vitals and nursing note reviewed.  Constitutional:      General: He is not in acute distress.    Appearance: He is obese.     Comments: Middle-age Caucasian gentleman laying comfortably in bed  HENT:     Head: Normocephalic.  Eyes:     Pupils: Pupils are equal, round, and reactive to light.  Cardiovascular:     Rate and Rhythm: Normal rate and regular rhythm.     Pulses: Normal pulses.     Heart sounds: No murmur heard.    No friction rub. No gallop.  Pulmonary:     Effort: Pulmonary effort is normal. No respiratory distress.     Breath sounds: No wheezing, rhonchi or rales.  Abdominal:     General: Bowel sounds are normal. There is no distension.     Palpations: Abdomen is soft.     Tenderness: There is no abdominal tenderness.  Musculoskeletal:        General: No tenderness.     Cervical back: Normal range of motion.     Right lower leg: Edema present.     Left lower leg: Edema present.  Skin:    General: Skin is warm and dry.     Capillary Refill: Capillary refill takes less than 2 seconds.     Findings: Erythema present.  Neurological:     General: No focal deficit present.     Mental Status: He is alert.  Psychiatric:        Mood and Affect: Mood normal.        Behavior: Behavior normal.        Thought Content: Thought content normal.        Judgment: Judgment normal.               Labs on Admission: I have personally reviewed following labs and imaging studies  CBC: Recent Labs  Lab 01/28/24 2050  WBC 15.0*  NEUTROABS 11.8*  HGB 13.8  HCT 40.2  MCV 90.1  PLT 510*   Basic Metabolic Panel: Recent Labs  Lab 01/28/24 2050  NA  140  K 3.8  CL 102  CO2 24  GLUCOSE 96  BUN 10  CREATININE 0.97  CALCIUM 10.0   GFR: Estimated Creatinine Clearance: 132.4 mL/min (by C-G formula based on SCr of 0.97 mg/dL). Liver Function Tests: Recent Labs  Lab 01/28/24 2050  AST 16  ALT 23  ALKPHOS 55  BILITOT 0.6  PROT 7.6  ALBUMIN 4.2  No results for input(s): LIPASE, AMYLASE in the last 168 hours. No results for input(s): AMMONIA in the last 168 hours. Coagulation Profile: No results for input(s): INR, PROTIME in the last 168 hours. Cardiac Enzymes: No results for input(s): CKTOTAL, CKMB, CKMBINDEX, TROPONINI, TROPONINIHS in the last 168 hours. BNP (last 3 results) No results for input(s): BNP in the last 8760 hours. HbA1C: No results for input(s): HGBA1C in the last 72 hours. CBG: No results for input(s): GLUCAP in the last 168 hours. Lipid Profile: No results for input(s): CHOL, HDL, LDLCALC, TRIG, CHOLHDL, LDLDIRECT in the last 72 hours. Thyroid  Function Tests: No results for input(s): TSH, T4TOTAL, FREET4, T3FREE, THYROIDAB in the last 72 hours. Anemia Panel: No results for input(s): VITAMINB12, FOLATE, FERRITIN, TIBC, IRON, RETICCTPCT in the last 72 hours. Urine analysis:    Component Value Date/Time   COLORURINE YELLOW 01/28/2024 2050   APPEARANCEUR CLEAR 01/28/2024 2050   LABSPEC 1.018 01/28/2024 2050   PHURINE 6.0 01/28/2024 2050   GLUCOSEU NEGATIVE 01/28/2024 2050   GLUCOSEU NEGATIVE 07/30/2018 1449   HGBUR NEGATIVE 01/28/2024 2050   BILIRUBINUR NEGATIVE 01/28/2024 2050   KETONESUR NEGATIVE 01/28/2024 2050   PROTEINUR NEGATIVE 01/28/2024 2050   UROBILINOGEN 0.2 07/30/2018 1449   NITRITE NEGATIVE 01/28/2024 2050   LEUKOCYTESUR NEGATIVE 01/28/2024 2050    Radiological Exams on Admission: I have personally reviewed images CT ABDOMEN PELVIS W CONTRAST Addendum Date: 01/28/2024 * ADDENDUM #1 ** ADDENDUM: An additional review, there is a  5 mm calculus in the glans penis, within the distal penile urethra at the urethral meatus, likely within the fossa navicularis. ---------------------------------------------------- Electronically signed by: Pinkie Pebbles MD 01/28/2024 11:17 PM EDT RP Workstation: [id number]   Result Date: 01/28/2024 * ORIGINAL REPORT * EXAM: CT ABDOMEN AND PELVIS WITH CONTRAST 01/28/2024 09:49:26 PM TECHNIQUE: CT of the abdomen and pelvis was performed with the administration of intravenous contrast. Multiplanar reformatted images are provided for review. Automated exposure control, iterative reconstruction, and/or weight based adjustment of the mA/kV was utilized to reduce the radiation dose to as low as reasonably achievable. CONTRAST: 100mL iohexol  (OMNIPAQUE ) 300 MG/ML solution COMPARISON: None available. CLINICAL HISTORY: Sepsis. Table formatting from the original note was not included. Pt reports concern for bilateral leg swelling and redness. S/s noted after a pedicure on July 4th. Redness and swelling since had spread into the shin. Denies a hx of DVT. Was seen [facility] clinic on Sunday 13th - prescribed abx cephalexin 500 mg - has since completed course of abx. Was seen again at [facility] clinic on Friday d/t ongoing redness and warmth. Started taking doxycycline with some improvement. Redness, swelling, and warmth worsened again on Monday. Now feeling aches, pain, and chills. Notable VS in triage HR 137 and T 100.4. Last took ibuprofen  at 2 pm. FINDINGS: LOWER CHEST: No acute abnormality. LIVER: Mild hepatic steatosis. GALLBLADDER AND BILE DUCTS: Gallbladder is unremarkable. No biliary ductal dilatation. SPLEEN: No acute abnormality. PANCREAS: No acute abnormality. ADRENAL GLANDS: No acute abnormality. KIDNEYS, URETERS AND BLADDER: No stones in the kidneys or ureters. No hydronephrosis. No perinephric or periureteral stranding. Urinary bladder is unremarkable. GI AND BOWEL: Stomach demonstrates no acute  abnormality. There is no bowel obstruction. No bowel wall thickening. Normal appendix (image 67). PERITONEUM AND RETROPERITONEUM: No ascites. No free air. VASCULATURE: Aorta is normal in caliber. LYMPH NODES: No lymphadenopathy. REPRODUCTIVE ORGANS: Prostate is unremarkable. BONES AND SOFT TISSUES: No acute osseous abnormality. Tiny fat-containing left inguinal hernia. IMPRESSION: 1. No acute findings in the abdomen  or pelvis. 2. Mild hepatic steatosis. Electronically signed by: Pinkie Pebbles MD 01/28/2024 09:52 PM EDT RP Workstation: [id number]    EKG: My personal interpretation of EKG shows: Sinus tachycardia HR 133, no conduction abnormalities  Assessment/Plan Principal Problem:   Lower extremity cellulitis   Acute medical issues: ##Severe Sepsis secondary to Bilateral Lower Extremity Cellulitis SIRs criteria: Lactic 2.3, Tmax 38C, HR 137, WBC 15 - IV Vancomycin  + Rocephin  - Nasal MRSA swab - Continue IVF hydration--> LR @ 150 mL/hr - Follow up blood cultures - Check A1C, CRP, ESR - US  DVT BLE to rule out DVT  - Follow daily CBC, BMP  ##Nephrolithiasis Progressing, imaging now shows 5mm stone in the distal penile urethra. UA from ED yesterday without signs of infection. - Continue flomax  - PRN analgesia - Urology consulted at patient's request, no plan for inpatient intervention at this time. Can follow up outpatient if needed.  ##Thrombocytosis Likely reactive in setting of infection/inflammation - Treat as above - Repeat CBC with AM labs  Chronic medical issues: #Hypertension #Bipolar Disorder #ADHD Not on any maintenance medications outpatient  VTE prophylaxis:  Lovenox   GI prophylaxis: Not indicated Diet: Regular Access: PIV Lines: NONE Code Status:  Full Code Telemetry: No  Admission status: Inpatient, Med-Surg Patient is from: Home  Anticipated d/c is to: Home  Anticipated d/c date is: 2-3 days Patient currently: Receiving IV antibiotics, IV fluids, pending  US  DVT, and pending symptomatic improvement and ongoing hemodynamic stability  Family Communication: Significant Other Jon called at patient's request, no answer, HIPAA  compliant voicemail left  Consults called: Urology    Severity of Illness: The appropriate patient status for this patient is INPATIENT. Inpatient status is judged to be reasonable and necessary in order to provide the required intensity of service to ensure the patient's safety. The patient's presenting symptoms, physical exam findings, and initial radiographic and laboratory data in the context of their chronic comorbidities is felt to place them at high risk for further clinical deterioration. Furthermore, it is not anticipated that the patient will be medically stable for discharge from the hospital within 2 midnights of admission.   * I certify that at the point of admission it is my clinical judgment that the patient will require inpatient hospital care spanning beyond 2 midnights from the point of admission due to high intensity of service, high risk for further deterioration and high frequency of surveillance required.*  To reach the provider On-Call:   7AM- 7PM see care teams to locate the attending and reach out to them via [email or web address]. Password: [email or web address] 7PM-7AM contact night-coverage If you still have difficulty reaching the appropriate provider, please page the [facility] (Director on Call) for [facility] on amion for assistance  This document was prepared using Conservation officer, historic buildings and may include unintentional dictation errors.  Rockie Rams FNP-BC, PMHNP-BC Nurse Practitioner [facility]

## 2024-01-29 NOTE — Progress Notes (Signed)
   01/29/24 1019  TOC Brief Assessment  Insurance and Status Reviewed  Patient has primary care physician Yes  Home environment has been reviewed resides in private residence  Prior level of function: Independent  Prior/Current Home Services No current home services  Social Drivers of Health Review SDOH reviewed no interventions necessary  Readmission risk has been reviewed Yes  Transition of care needs no transition of care needs at this time

## 2024-01-30 DIAGNOSIS — L03115 Cellulitis of right lower limb: Secondary | ICD-10-CM

## 2024-01-30 DIAGNOSIS — R600 Localized edema: Secondary | ICD-10-CM | POA: Insufficient documentation

## 2024-01-30 DIAGNOSIS — E872 Acidosis, unspecified: Secondary | ICD-10-CM | POA: Diagnosis not present

## 2024-01-30 DIAGNOSIS — M130 Polyarthritis, unspecified: Secondary | ICD-10-CM | POA: Diagnosis not present

## 2024-01-30 DIAGNOSIS — A419 Sepsis, unspecified organism: Secondary | ICD-10-CM | POA: Diagnosis not present

## 2024-01-30 DIAGNOSIS — L03116 Cellulitis of left lower limb: Secondary | ICD-10-CM

## 2024-01-30 DIAGNOSIS — N211 Calculus in urethra: Secondary | ICD-10-CM

## 2024-01-30 DIAGNOSIS — L0390 Cellulitis, unspecified: Secondary | ICD-10-CM

## 2024-01-30 LAB — BASIC METABOLIC PANEL WITH GFR
Anion gap: 12 (ref 5–15)
BUN: 6 mg/dL (ref 6–20)
CO2: 18 mmol/L — ABNORMAL LOW (ref 22–32)
Calcium: 7.8 mg/dL — ABNORMAL LOW (ref 8.9–10.3)
Chloride: 108 mmol/L (ref 98–111)
Creatinine, Ser: 0.63 mg/dL (ref 0.61–1.24)
GFR, Estimated: >60 mL/min (ref >60)
Glucose, Bld: 71 mg/dL (ref 70–99)
Potassium: 3.9 mmol/L (ref 3.5–5.1)
Sodium: 138 mmol/L (ref 135–145)

## 2024-01-30 LAB — CBC
HCT: 34.9 % — ABNORMAL LOW (ref 39.0–52.0)
Hemoglobin: 11.3 g/dL — ABNORMAL LOW (ref 13.0–17.0)
MCH: 31.2 pg (ref 26.0–34.0)
MCHC: 32.4 g/dL (ref 30.0–36.0)
MCV: 96.4 fL (ref 80.0–100.0)
Platelets: 378 K/uL (ref 150–400)
RBC: 3.62 MIL/uL — ABNORMAL LOW (ref 4.22–5.81)
RDW: 11.7 % (ref 11.5–15.5)
WBC: 8.6 K/uL (ref 4.0–10.5)
nRBC: 0 % (ref 0.0–0.2)

## 2024-01-30 LAB — BRAIN NATRIURETIC PEPTIDE: B Natriuretic Peptide: 82.8 pg/mL (ref 0.0–100.0)

## 2024-01-30 MED ORDER — FUROSEMIDE 10 MG/ML IJ SOLN: 40.0000 mg | Freq: Once | INTRAMUSCULAR | Status: DC

## 2024-01-30 MED ORDER — DOXYCYCLINE HYCLATE 100 MG PO TABS
100.0000 mg | ORAL_TABLET | Freq: Two times a day (BID) | ORAL | Status: DC
  Administered 2024-01-31 (×2): 100 mg via ORAL
  Filled 2024-01-30 (×2): qty 1

## 2024-01-30 MED ORDER — AMOXICILLIN-POT CLAVULANATE 875-125 MG PO TABS
1.0000 | ORAL_TABLET | Freq: Two times a day (BID) | ORAL | Status: DC
  Administered 2024-01-31 (×2): 1 via ORAL
  Filled 2024-01-30 (×2): qty 1

## 2024-01-30 NOTE — Assessment & Plan Note (Signed)
 Patient presented with 3-week history of worsening bilateral lower extremity redness swelling and discomfort On arrival patient exhibited multiple SIRS criteria including fever, leukocytosis, tachycardia with concurrent lactic acidosis In the absence of other infectious sources patient was felt to be suffering from bilateral lower extremity cellulitis Patient has been treated with intravenous vancomycin  and ceftriaxone , day 2 -will likely attempt to transition patient to oral antibiotic therapy Patient has been hydrated aggressively with intravenous isotonic fluids considering concurrent lactic acidosis Blood cultures have been obtained and are currently without growth.

## 2024-01-30 NOTE — Hospital Course (Addendum)
 38 year old male presenting with several weeks of persisting bilateral lower extremity swelling and redness associated with bilateral ankle and left wrist pain.  Patient presenting with associated SIRS and lactic acidosis concerning for possible sepsis due to bilateral lower extremity cellulitis.  Presentation additionally complicated by presence of a 5 mm stone in the penile urethra.

## 2024-01-30 NOTE — Plan of Care (Signed)
  Problem: Education: Goal: Knowledge of General Education information will improve Description: Including pain rating scale, medication(s)/side effects and non-pharmacologic comfort measures Outcome: Progressing   Problem: Health Behavior/Discharge Planning: Goal: Ability to manage health-related needs will improve Outcome: Progressing   Problem: Activity: Goal: Risk for activity intolerance will decrease Outcome: Progressing   Problem: Nutrition: Goal: Adequate nutrition will be maintained Outcome: Progressing   Problem: Coping: Goal: Level of anxiety will decrease Outcome: Progressing   Problem: Pain Managment: Goal: General experience of comfort will improve and/or be controlled Outcome: Progressing

## 2024-01-30 NOTE — Progress Notes (Signed)
 PROGRESS NOTE   Jacob [name]  FMW:[id number] DOB: [birth year] DOA: 01/28/2024 PCP: Garald Karlynn GAILS, MD   Date of Service: the patient was seen and examined on 01/30/2024  Brief Narrative:  38 year old male presenting with several weeks of persisting bilateral lower extremity swelling and redness associated with bilateral ankle and left wrist pain.  Patient presenting with associated SIRS and lactic acidosis concerning for possible sepsis due to bilateral lower extremity cellulitis.  Presentation additionally complicated by presence of a 5 mm stone in the penile urethra.     Assessment & Plan Cellulitis of both lower extremities Sepsis due to cellulitis (HCC) Lactic acidosis Patient presented with 3-week history of worsening bilateral lower extremity redness swelling and discomfort On arrival patient exhibited multiple SIRS criteria including fever, leukocytosis, tachycardia with concurrent lactic acidosis In the absence of other infectious sources patient was felt to be suffering from bilateral lower extremity cellulitis Patient has been treated with intravenous vancomycin  and ceftriaxone , day 2 -will likely attempt to transition patient to oral antibiotic therapy Patient has been hydrated aggressively with intravenous isotonic fluids considering concurrent lactic acidosis Blood cultures have been obtained and are currently without growth. Urethral stone Patient has been experiencing 1 month history of penile discomfort CT imaging on admission confirms a 5 mm stone in the urethra We have hydrated the patient aggressively with intravenous isotonic fluids to facilitate frequent urination We have increased the patient's Flomax  to 0.8 mg daily Patient additionally is now on naproxen  500 mg twice daily Patient was evaluated by urology shortly after hospitalization recommended outpatient follow-up if stone does not pass.   Polyarthropathy Vague presentation with bilateral ankle and left  wrist pain for at least the past several weeks Pain with both passive and active range of motion of the bilateral ankles Septic arthritis extremely unlikely Anti-CCP, RF, ANA, Lyme titers and complement levels all ordered and pending and can likely be followed up upon as an outpatient.     Subjective:  Patient complaining of mild discomfort of the bilateral feet and ankles  Physical Exam:  Vitals:   01/29/24 2111 01/30/24 0648 01/30/24 1322 01/30/24 2159  BP: (!) 161/96 128/89 133/83 135/80  Pulse: (!) 108 71 84 (!) 101  Resp: 18 18 18 18   Temp: 99.9 F (37.7 C) 97.8 F (36.6 C) 98.7 F (37.1 C) 99.3 F (37.4 C)  TempSrc: Oral Oral Oral Oral  SpO2: 99% 98% 98% 99%  Weight:      Height:        Constitutional: Awake alert and oriented x3, no associated distress.   Skin: Significant redness over the surfaces of the bilateral lower EXTR.  No other rashes, no lesions, good skin turgor noted. Eyes: Pupils are equally reactive to light.  No evidence of scleral icterus or conjunctival pallor.  ENMT: Moist mucous membranes noted.  Posterior pharynx clear of any exudate or lesions.   Respiratory: clear to auscultation bilaterally, no wheezing, no crackles. Normal respiratory effort. No accessory muscle use.  Cardiovascular: Regular rate and rhythm, no murmurs / rubs / gallops. No extremity edema. 2+ pedal pulses. No carotid bruits.  Abdomen: Abdomen is soft and nontender.  No evidence of intra-abdominal masses.  Positive bowel sounds noted in all quadrants.   Musculoskeletal: Some pain with both passive and active range of motion of the bilateral ankles.  No joint deformity upper and lower extremities. Good ROM, no contractures. Normal muscle tone.    Data Reviewed:  I have personally reviewed and interpreted labs,  imaging.  Significant findings are   CBC: Recent Labs  Lab 01/28/24 2050 01/29/24 1009 01/30/24 0338  WBC 15.0* 10.1 8.6  NEUTROABS 11.8*  --   --   HGB 13.8  12.2* 11.3*  HCT 40.2 37.3* 34.9*  MCV 90.1 92.6 96.4  PLT 510* 416* 378   Basic Metabolic Panel: Recent Labs  Lab 01/28/24 2050 01/29/24 1009 01/30/24 0338  NA 140 139 138  K 3.8 3.6 3.9  CL 102 108 108  CO2 24 23 18*  GLUCOSE 96 116* 71  BUN 10 8 6   CREATININE 0.97 0.52* 0.63  CALCIUM 10.0 8.5* 7.8*   GFR: Estimated Creatinine Clearance: 160.6 mL/min (by C-G formula based on SCr of 0.63 mg/dL). Liver Function Tests: Recent Labs  Lab 01/28/24 2050 01/29/24 1009  AST 16 13*  ALT 23 19  ALKPHOS 55 37*  BILITOT 0.6 1.2  PROT 7.6 6.3*  ALBUMIN 4.2 2.9*    Coagulation Profile: No results for input(s): INR, PROTIME in the last 168 hours.    Code Status:  Full code.  Code status decision has been confirmed with: patient Family Communication: Girlfriend at the bedside who has been updated on plan of care.   Severity of Illness:  The appropriate patient status for this patient is OBSERVATION. Observation status is judged to be reasonable and necessary in order to provide the required intensity of service to ensure the patient's safety. The patient's presenting symptoms, physical exam findings, and initial radiographic and laboratory data in the context of their medical condition is felt to place them at decreased risk for further clinical deterioration. Furthermore, it is anticipated that the patient will be medically stable for discharge from the hospital within 2 midnights of admission.   Time spent:  49 minutes  Author:  Zachary JINNY Ba MD  01/30/2024 11:31 PM

## 2024-01-30 NOTE — Assessment & Plan Note (Signed)
 Patient has been experiencing 1 month history of penile discomfort CT imaging on admission confirms a 5 mm stone in the urethra We have hydrated the patient aggressively with intravenous isotonic fluids to facilitate frequent urination We have increased the patient's Flomax  to 0.8 mg daily Patient additionally is now on naproxen  500 mg twice daily Patient was evaluated by urology shortly after hospitalization recommended outpatient follow-up if stone does not pass.

## 2024-01-30 NOTE — Assessment & Plan Note (Signed)
 Vague presentation with bilateral ankle and left wrist pain for at least the past several weeks Pain with both passive and active range of motion of the bilateral ankles Septic arthritis extremely unlikely Anti-CCP, RF, ANA, Lyme titers and complement levels all ordered and pending and can likely be followed up upon as an outpatient.

## 2024-01-31 DIAGNOSIS — L03115 Cellulitis of right lower limb: Secondary | ICD-10-CM | POA: Diagnosis not present

## 2024-01-31 DIAGNOSIS — A419 Sepsis, unspecified organism: Secondary | ICD-10-CM | POA: Diagnosis not present

## 2024-01-31 DIAGNOSIS — R600 Localized edema: Secondary | ICD-10-CM | POA: Diagnosis not present

## 2024-01-31 DIAGNOSIS — E872 Acidosis, unspecified: Secondary | ICD-10-CM | POA: Diagnosis not present

## 2024-01-31 LAB — CBC WITH DIFFERENTIAL/PLATELET
Abs Immature Granulocytes: 0.09 K/uL — ABNORMAL HIGH (ref 0.00–0.07)
Basophils Absolute: 0.1 K/uL (ref 0.0–0.1)
Basophils Relative: 1 %
Eosinophils Absolute: 0.4 K/uL (ref 0.0–0.5)
Eosinophils Relative: 4 %
HCT: 38 % — ABNORMAL LOW (ref 39.0–52.0)
Hemoglobin: 12.2 g/dL — ABNORMAL LOW (ref 13.0–17.0)
Immature Granulocytes: 1 %
Lymphocytes Relative: 19 %
Lymphs Abs: 1.7 K/uL (ref 0.7–4.0)
MCH: 29.8 pg (ref 26.0–34.0)
MCHC: 32.1 g/dL (ref 30.0–36.0)
MCV: 92.9 fL (ref 80.0–100.0)
Monocytes Absolute: 0.6 K/uL (ref 0.1–1.0)
Monocytes Relative: 6 %
Neutro Abs: 6.1 K/uL (ref 1.7–7.7)
Neutrophils Relative %: 69 %
Platelets: 419 K/uL — ABNORMAL HIGH (ref 150–400)
RBC: 4.09 MIL/uL — ABNORMAL LOW (ref 4.22–5.81)
RDW: 11.6 % (ref 11.5–15.5)
WBC: 8.8 K/uL (ref 4.0–10.5)
nRBC: 0 % (ref 0.0–0.2)

## 2024-01-31 LAB — ANA W/REFLEX IF POSITIVE: Anti Nuclear Antibody (ANA): NEGATIVE

## 2024-01-31 LAB — COMPREHENSIVE METABOLIC PANEL WITH GFR
ALT: 19 U/L (ref 0–44)
AST: 15 U/L (ref 15–41)
Albumin: 2.9 g/dL — ABNORMAL LOW (ref 3.5–5.0)
Alkaline Phosphatase: 41 U/L (ref 38–126)
Anion gap: 11 (ref 5–15)
BUN: 9 mg/dL (ref 6–20)
CO2: 22 mmol/L (ref 22–32)
Calcium: 8.7 mg/dL — ABNORMAL LOW (ref 8.9–10.3)
Chloride: 107 mmol/L (ref 98–111)
Creatinine, Ser: 0.83 mg/dL (ref 0.61–1.24)
GFR, Estimated: >60 mL/min (ref >60)
Glucose, Bld: 92 mg/dL (ref 70–99)
Potassium: 3.7 mmol/L (ref 3.5–5.1)
Sodium: 140 mmol/L (ref 135–145)
Total Bilirubin: 0.5 mg/dL (ref 0.0–1.2)
Total Protein: 6.6 g/dL (ref 6.5–8.1)

## 2024-01-31 LAB — MAGNESIUM: Magnesium: 2.1 mg/dL (ref 1.7–2.4)

## 2024-01-31 LAB — C4 COMPLEMENT: Complement C4, Body Fluid: 38 mg/dL (ref 12–38)

## 2024-01-31 LAB — RHEUMATOID FACTOR: Rheumatoid fact SerPl-aCnc: 13.5 [IU]/mL (ref <14.0)

## 2024-01-31 LAB — C3 COMPLEMENT: C3 Complement: 170 mg/dL — ABNORMAL HIGH (ref 82–167)

## 2024-01-31 MED ORDER — ONDANSETRON HCL 4 MG PO TABS: 4.0000 mg | ORAL_TABLET | Freq: Four times a day (QID) | ORAL | 0 refills | Status: AC | PRN

## 2024-01-31 MED ORDER — TAMSULOSIN HCL 0.4 MG PO CAPS: 0.8000 mg | ORAL_CAPSULE | Freq: Every day | ORAL | 0 refills | Status: AC

## 2024-01-31 MED ORDER — AMOXICILLIN-POT CLAVULANATE 875-125 MG PO TABS: 1.0000 | ORAL_TABLET | Freq: Two times a day (BID) | ORAL | 0 refills | Status: AC

## 2024-01-31 MED ORDER — DOXYCYCLINE HYCLATE 100 MG PO TABS: 100.0000 mg | ORAL_TABLET | Freq: Two times a day (BID) | ORAL | 0 refills | Status: AC

## 2024-01-31 NOTE — Plan of Care (Signed)
  Problem: Clinical Measurements: Goal: Diagnostic test results will improve Outcome: Progressing   Problem: Activity: Goal: Risk for activity intolerance will decrease Outcome: Completed/Met   Problem: Nutrition: Goal: Adequate nutrition will be maintained Outcome: Completed/Met   Problem: Coping: Goal: Level of anxiety will decrease Outcome: Completed/Met   Problem: Elimination: Goal: Will not experience complications related to bowel motility Outcome: Completed/Met

## 2024-01-31 NOTE — Discharge Summary (Signed)
 Physician Discharge Summary   Patient: Jacob [name] MRN: [id number] DOB: 1985-09-27  Admit date:     01/28/2024  Discharge date: 01/31/24  Discharge Physician: Jacob [name]   PCP: Jacob Karlynn GAILS, MD   Recommendations at discharge:    Pt to be discharged home.   If you experience worsening fever, chills, chest pain, shortness of breath, or other concerning symptoms, please call your PCP or go to the emergency department immediately.  Discharge Diagnoses: Principal Problem:   Cellulitis of both lower extremities Active Problems:   Sepsis due to cellulitis [facility])   Urethral stone   Thrombocytosis   Polyarthropathy   Lactic acidosis   Edema of both lower legs  Resolved Problems:   Thrombocytopenia [facility])   Hospital Course:  38 year old male presenting with several weeks of persisting bilateral lower extremity swelling and redness associated with bilateral ankle and left wrist pain. Patient presenting with associated SIRS and lactic acidosis concerning for possible sepsis due to bilateral lower extremity cellulitis. Presentation additionally complicated by presence of a 5 mm stone in the penile urethra.   Assessment and Plan:  Sepsis secondary to cellulitis of BL LE - Reported 3-week history of lower extremity erythema and swelling discomfort.  SIRS criteria with fever, leukocytosis, tachycardia, lactic acidosis.  Initiated on broad-spectrum antibiotic coverage with improvement in lower extremity edema and erythema.  Patient transition to p.o. Augmentin  plus Keflex empirically.  Patient to take as directed upon discharge.  Other etiologies include possible autoimmune disease.  Elevated sed rate/CRP.  ANA ordered but still pending.  Would recommend further workup in the outpatient setting with primary care provider.  Urethral stone - 1 month history of penile discomfort.  CT imaging confirming 5 mm stone in the urethra.  Flomax  0.8 mg daily, naproxen , aggressive IV fluids.   Still has not had passage of stone.  Evaluated by urology with recommendations for outpatient follow-up should stone not pass.  Will give referral for outpatient urology follow-up.  Polyarthropathy - Noted vague presentation with bilateral joint pains in ankles, wrists for the past several weeks.  Noted lower extremity edema and erythema.  Elevated CRP, sed rate.  Ordered ANA, Lyme, lupus, rheumatoid however all still pending.  Results and further workup can be pursued in the outpatient setting with primary care provider.  Consultants: Urology Procedures performed: None Disposition: Home Diet recommendation:  Discharge Diet Orders (From admission, onward)     Start     Ordered   01/31/24 0000  Diet - low sodium heart healthy        01/31/24 1106           Cardiac and Carb modified diet  DISCHARGE MEDICATION: Allergies as of 01/31/2024       Reactions   Amlodipine  Other (See Comments)   Head felt foggy        Medication List     STOP taking these medications    cephALEXin 500 MG capsule Commonly known as: KEFLEX   doxycycline  100 MG capsule Commonly known as: VIBRAMYCIN  Replaced by: doxycycline  100 MG tablet   Medrol  4 MG Tbpk tablet Generic drug: methylPREDNISolone        TAKE these medications    albuterol  108 (90 Base) MCG/ACT inhaler Commonly known as: VENTOLIN  HFA Inhale 2 puffs into the lungs every 6 (six) hours as needed for wheezing or shortness of breath.   amoxicillin -clavulanate 875-125 MG tablet Commonly known as: AUGMENTIN  Take 1 tablet by mouth every 12 (twelve) hours for 7 days.  Diclofenac  Sodium 2 % Soln Place 2 g onto the skin 2 (two) times daily. What changed:  how much to take when to take this reasons to take this   doxycycline  100 MG tablet Commonly known as: VIBRA -TABS Take 1 tablet (100 mg total) by mouth every 12 (twelve) hours for 7 days. Replaces: doxycycline  100 MG capsule   ibuprofen  200 MG tablet Commonly known as:  ADVIL  Take 800 mg by mouth daily as needed for mild pain (pain score 1-3) or moderate pain (pain score 4-6).   naproxen  sodium 220 MG tablet Commonly known as: ALEVE  Take 220 mg by mouth 2 (two) times daily as needed (pain).   ondansetron  4 MG tablet Commonly known as: ZOFRAN  Take 1 tablet (4 mg total) by mouth every 6 (six) hours as needed for nausea.   tamsulosin  0.4 MG Caps capsule Commonly known as: FLOMAX  Take 0.4 mg by mouth at bedtime. What changed: Another medication with the same name was added. Make sure you understand how and when to take each.   tamsulosin  0.4 MG Caps capsule Commonly known as: FLOMAX  Take 2 capsules (0.8 mg total) by mouth daily. What changed: You were already taking a medication with the same name, and this prescription was added. Make sure you understand how and when to take each.         Discharge Exam: Filed Weights   01/28/24 2110 01/29/24 0226  Weight: 111.1 kg 111.5 kg    GENERAL:  Alert, pleasant, no acute distress  HEENT:  EOMI CARDIOVASCULAR:  RRR, no murmurs appreciated RESPIRATORY:  Clear to auscultation, no wheezing, rales, or rhonchi GASTROINTESTINAL:  Soft, nontender, nondistended EXTREMITIES:  No LE edema bilaterally NEURO:  No new focal deficits appreciated SKIN: Trace lower extremity edema and erythema PSYCH:  Appropriate mood and affect     Condition at discharge: improving  The results of significant diagnostics from this hospitalization (including imaging, microbiology, ancillary and laboratory) are listed below for reference.   Imaging Studies: VAS US  LOWER EXTREMITY VENOUS (DVT) Result Date: 01/29/2024  Lower Venous DVT Study Patient Name:  Jacob [name]  Date of Exam:   01/29/2024 Medical Rec #: [id number]       Accession #:    [id number] Date of Birth: 1985-09-02       Patient Gender: M Patient Age:   38 years Exam Location:  [facility] Procedure:      VAS US  LOWER EXTREMITY VENOUS (DVT) Referring Phys:  OSCAR ZELAYA --------------------------------------------------------------------------------  Indications: Erythema, and Swelling. Other Indications: Cellulitis. Comparison Study: No previous exams Performing Technologist: Jody Hill RVT, RDMS  Examination Guidelines: A complete evaluation includes B-mode imaging, spectral Doppler, color Doppler, and power Doppler as needed of all accessible portions of each vessel. Bilateral testing is considered an integral part of a complete examination. Limited examinations for reoccurring indications may be performed as noted. The reflux portion of the exam is performed with the patient in reverse Trendelenburg.  +---------+---------------+---------+-----------+----------+--------------+ RIGHT    CompressibilityPhasicitySpontaneityPropertiesThrombus Aging +---------+---------------+---------+-----------+----------+--------------+ CFV      Full           Yes      Yes                                 +---------+---------------+---------+-----------+----------+--------------+ SFJ      Full                                                        +---------+---------------+---------+-----------+----------+--------------+  FV Prox  Full           Yes      Yes                                 +---------+---------------+---------+-----------+----------+--------------+ FV Mid   Full           Yes      Yes                                 +---------+---------------+---------+-----------+----------+--------------+ FV DistalFull           Yes      Yes                                 +---------+---------------+---------+-----------+----------+--------------+ PFV      Full                                                        +---------+---------------+---------+-----------+----------+--------------+ POP      Full           Yes      Yes                                  +---------+---------------+---------+-----------+----------+--------------+ PTV      Full                                                        +---------+---------------+---------+-----------+----------+--------------+ PERO     Full                                                        +---------+---------------+---------+-----------+----------+--------------+   +---------+---------------+---------+-----------+----------+--------------+ LEFT     CompressibilityPhasicitySpontaneityPropertiesThrombus Aging +---------+---------------+---------+-----------+----------+--------------+ CFV      Full           Yes      Yes                                 +---------+---------------+---------+-----------+----------+--------------+ SFJ      Full                                                        +---------+---------------+---------+-----------+----------+--------------+ FV Prox  Full           Yes      Yes                                 +---------+---------------+---------+-----------+----------+--------------+ FV Mid   Full  Yes      Yes                                 +---------+---------------+---------+-----------+----------+--------------+ FV DistalFull           Yes      Yes                                 +---------+---------------+---------+-----------+----------+--------------+ PFV      Full                                                        +---------+---------------+---------+-----------+----------+--------------+ POP      Full           Yes      Yes                                 +---------+---------------+---------+-----------+----------+--------------+ PTV      Full                                                        +---------+---------------+---------+-----------+----------+--------------+ PERO     Full                                                         +---------+---------------+---------+-----------+----------+--------------+     Summary: BILATERAL: - No evidence of deep vein thrombosis seen in the lower extremities, bilaterally. - RIGHT: - A cystic structure is found in the popliteal fossa 3.37 x 1.02 x 3.0 cm. - Ultrasound characteristics of enlarged lymph nodes are noted in the groin.  LEFT: - No cystic structure found in the popliteal fossa. - Ultrasound characteristics of enlarged lymph nodes noted in the groin.  *See table(s) above for measurements and observations. Electronically signed by Lonni Gaskins MD on 01/29/2024 at 4:47:58 PM.    Final    CT ABDOMEN PELVIS W CONTRAST Addendum Date: 01/28/2024 ** ADDENDUM #1 ** ADDENDUM: An additional review, there is a 5 mm calculus in the glans penis, within the distal penile urethra at the urethral meatus, likely within the fossa navicularis. ---------------------------------------------------- Electronically signed by: Pinkie Pebbles MD 01/28/2024 11:17 PM EDT RP Workstation: [id number]   Result Date: 01/28/2024 ** ORIGINAL REPORT ** EXAM: CT ABDOMEN AND PELVIS WITH CONTRAST 01/28/2024 09:49:26 PM TECHNIQUE: CT of the abdomen and pelvis was performed with the administration of intravenous contrast. Multiplanar reformatted images are provided for review. Automated exposure control, iterative reconstruction, and/or weight based adjustment of the mA/kV was utilized to reduce the radiation dose to as low as reasonably achievable. CONTRAST: 100mL iohexol  (OMNIPAQUE ) 300 MG/ML solution COMPARISON: None available. CLINICAL HISTORY: Sepsis. Table formatting from the original note was not included. Pt reports concern for bilateral leg swelling and redness. S/s noted after a pedicure on July 4th. Redness and swelling since had spread  into the shin. Denies a hx of DVT. Was seen [facility] clinic on Sunday 13th - prescribed abx cephalexin 500 mg - has since completed course of abx. Was seen again at [facility]  clinic on Friday d/t ongoing redness and warmth. Started taking doxycycline  with some improvement. Redness, swelling, and warmth worsened again on Monday. Now feeling aches, pain, and chills. Notable VS in triage HR 137 and T 100.4. Last took ibuprofen  at 2 pm. FINDINGS: LOWER CHEST: No acute abnormality. LIVER: Mild hepatic steatosis. GALLBLADDER AND BILE DUCTS: Gallbladder is unremarkable. No biliary ductal dilatation. SPLEEN: No acute abnormality. PANCREAS: No acute abnormality. ADRENAL GLANDS: No acute abnormality. KIDNEYS, URETERS AND BLADDER: No stones in the kidneys or ureters. No hydronephrosis. No perinephric or periureteral stranding. Urinary bladder is unremarkable. GI AND BOWEL: Stomach demonstrates no acute abnormality. There is no bowel obstruction. No bowel wall thickening. Normal appendix (image 67). PERITONEUM AND RETROPERITONEUM: No ascites. No free air. VASCULATURE: Aorta is normal in caliber. LYMPH NODES: No lymphadenopathy. REPRODUCTIVE ORGANS: Prostate is unremarkable. BONES AND SOFT TISSUES: No acute osseous abnormality. Tiny fat-containing left inguinal hernia. IMPRESSION: 1. No acute findings in the abdomen or pelvis. 2. Mild hepatic steatosis. Electronically signed by: Pinkie Pebbles MD 01/28/2024 09:52 PM EDT RP Workstation: [id number]    Microbiology: Results for orders placed or performed during the hospital encounter of 01/28/24  Culture, blood (single)     Status: None (Preliminary result)   Collection Time: 01/28/24  9:35 PM   Specimen: BLOOD  Result Value Ref Range Status   Specimen Description   Final    BLOOD LEFT ANTECUBITAL Performed at [facility], [address] [place], [place] [address]    Special Requests   Final    BOTTLES DRAWN AEROBIC AND ANAEROBIC Blood Culture adequate volume Performed at [facility], [address], [place], [place] [address]    Culture   Final    NO GROWTH < 24 HOURS Performed at [facility], [address] [place], [place] [address]    Report Status PENDING  Incomplete  Culture, blood (single)     Status: None (Preliminary result)   Collection Time: 01/28/24  9:35 PM   Specimen: BLOOD RIGHT HAND  Result Value Ref Range Status   Specimen Description   Final    BLOOD RIGHT HAND Performed at [facility], [address] [place], [place] [address]    Special Requests   Final    BOTTLES DRAWN AEROBIC AND ANAEROBIC Blood Culture adequate volume Performed at [facility], [address] [place], [place] [address]    Culture   Final    NO GROWTH < 24 HOURS Performed at [facility], [address] [place], [place] [address]    Report Status PENDING  Incomplete  Resp panel by RT-PCR (RSV, Flu A&B, Covid) Anterior Nasal Swab     Status: None   Collection Time: 01/29/24  1:12 AM   Specimen: Anterior Nasal Swab  Result Value Ref Range Status   SARS Coronavirus 2 by RT PCR NEGATIVE NEGATIVE Final    Comment: (NOTE) SARS-CoV-2 target nucleic acids are NOT DETECTED.  The SARS-CoV-2 RNA is generally detectable in upper respiratory specimens during the acute phase of infection. The lowest concentration of SARS-CoV-2 viral copies this assay can detect is 138 copies/mL. A negative result does not preclude SARS-Cov-2 infection and should not be used as the sole basis for treatment or other patient management decisions.  A negative result may occur with  improper specimen collection/handling, submission of specimen other than nasopharyngeal swab, presence of viral mutation(s) within the areas targeted by this assay, and inadequate number of viral copies(<138 copies/mL). A negative result must be combined with clinical observations, patient history, and epidemiological information. The expected result is Negative.  Fact Sheet for Patients:  BloggerCourse.com  Fact Sheet for Healthcare Providers:   SeriousBroker.it  This test is no t yet approved or cleared by the [place]  FDA and  has been authorized for detection and/or diagnosis of SARS-CoV-2 by FDA under an Emergency Use Authorization (EUA). This EUA will remain  in effect (meaning this test can be used) for the duration of the COVID-19 declaration under Section 564(b)(1) of the Act, 21 U.S.C.section 360bbb-3(b)(1), unless the authorization is terminated  or revoked sooner.       Influenza A by PCR NEGATIVE NEGATIVE Final   Influenza B by PCR NEGATIVE NEGATIVE Final    Comment: (NOTE) The Xpert Xpress SARS-CoV-2/FLU/RSV plus assay is intended as an aid in the diagnosis of influenza from Nasopharyngeal swab specimens and should not be used as a sole basis for treatment. Nasal washings and aspirates are unacceptable for Xpert Xpress SARS-CoV-2/FLU/RSV testing.  Fact Sheet for Patients: BloggerCourse.com  Fact Sheet for Healthcare Providers: SeriousBroker.it  This test is not yet approved or cleared by the [place]  FDA and has been authorized for detection and/or diagnosis of SARS-CoV-2 by FDA under an Emergency Use Authorization (EUA). This EUA will remain in effect (meaning this test can be used) for the duration of the COVID-19 declaration under Section 564(b)(1) of the Act, 21 U.S.C. section 360bbb-3(b)(1), unless the authorization is terminated or revoked.     Resp Syncytial Virus by PCR NEGATIVE NEGATIVE Final    Comment: (NOTE) Fact Sheet for Patients: BloggerCourse.com  Fact Sheet for Healthcare Providers: SeriousBroker.it  This test is not yet approved or cleared by the [place]  FDA and has been authorized for detection and/or diagnosis of SARS-CoV-2 by FDA under an Emergency Use Authorization (EUA). This EUA will remain in effect (meaning this test can be used) for  the duration of the COVID-19 declaration under Section 564(b)(1) of the Act, 21 U.S.C. section 360bbb-3(b)(1), unless the authorization is terminated or revoked.  Performed at [facility], [address] [place], [place] [address]   MRSA Next Gen by PCR, Nasal     Status: None   Collection Time: 01/29/24 11:34 AM   Specimen: Nasal Mucosa; Nasal Swab  Result Value Ref Range Status   MRSA by PCR Next Gen NOT DETECTED NOT DETECTED Final    Comment: (NOTE) The GeneXpert MRSA Assay (FDA approved for NASAL specimens only), is one component of a comprehensive MRSA colonization surveillance program. It is not intended to diagnose MRSA infection nor to guide or monitor treatment for MRSA infections. Test performance is not FDA approved in patients less than 46 years old. Performed at [facility], [address] [place], [place] [address]     Labs: CBC: Recent Labs  Lab 01/28/24 2050 01/29/24 1009 01/30/24 0338 01/31/24 0334  WBC 15.0* 10.1 8.6 8.8  NEUTROABS 11.8*  --   --  6.1  HGB 13.8 12.2* 11.3* 12.2*  HCT 40.2 37.3* 34.9* 38.0*  MCV 90.1 92.6 96.4 92.9  PLT 510* 416* 378 419*   Basic Metabolic Panel: Recent Labs  Lab 01/28/24 2050 01/29/24 1009 01/30/24 0338 01/31/24 0334  NA 140 139 138 140  K  3.8 3.6 3.9 3.7  CL 102 108 108 107  CO2 24 23 18* 22  GLUCOSE 96 116* 71 92  BUN 10 8 6 9   CREATININE 0.97 0.52* 0.63 0.83  CALCIUM 10.0 8.5* 7.8* 8.7*  MG  --   --   --  2.1   Liver Function Tests: Recent Labs  Lab 01/28/24 2050 01/29/24 1009 01/31/24 0334  AST 16 13* 15  ALT 23 19 19   ALKPHOS 55 37* 41  BILITOT 0.6 1.2 0.5  PROT 7.6 6.3* 6.6  ALBUMIN 4.2 2.9* 2.9*   CBG: No results for input(s): GLUCAP in the last 168 hours.  Discharge time spent: 27 minutes.  Length of inpatient stay: 2 days  Signed: Carliss LELON Canales, DO [facility] 01/31/2024

## 2024-02-02 LAB — LYME DISEASE SEROLOGY W/REFLEX: Lyme Total Antibody EIA: NEGATIVE

## 2024-02-03 LAB — CULTURE, BLOOD (SINGLE)
Culture: NO GROWTH
Culture: NO GROWTH
Special Requests: ADEQUATE
Special Requests: ADEQUATE

## 2024-02-03 LAB — CYCLIC CITRUL PEPTIDE ANTIBODY, IGG/IGA: CCP Antibodies IgG/IgA: 2 U (ref 0–19)
